# Patient Record
Sex: Female | Born: 1940
Health system: Southern US, Community
[De-identification: ages and names within clinical notes are randomized; demographics above are authoritative.]

## PROBLEM LIST (undated history)

## (undated) ENCOUNTER — Emergency Department (HOSPITAL_BASED_OUTPATIENT_CLINIC_OR_DEPARTMENT_OTHER): Admission: EM | Payer: PPO

## (undated) DIAGNOSIS — D151 Benign neoplasm of heart: Secondary | ICD-10-CM

## (undated) DIAGNOSIS — I10 Essential (primary) hypertension: Secondary | ICD-10-CM

## (undated) DIAGNOSIS — I480 Paroxysmal atrial fibrillation: Secondary | ICD-10-CM

## (undated) DIAGNOSIS — I471 Supraventricular tachycardia, unspecified: Secondary | ICD-10-CM

## (undated) DIAGNOSIS — I4892 Unspecified atrial flutter: Secondary | ICD-10-CM

## (undated) DIAGNOSIS — I351 Nonrheumatic aortic (valve) insufficiency: Secondary | ICD-10-CM

## (undated) DIAGNOSIS — E785 Hyperlipidemia, unspecified: Secondary | ICD-10-CM

## (undated) DIAGNOSIS — I441 Atrioventricular block, second degree: Secondary | ICD-10-CM

## (undated) DIAGNOSIS — G4719 Other hypersomnia: Secondary | ICD-10-CM

## (undated) DIAGNOSIS — M1990 Unspecified osteoarthritis, unspecified site: Secondary | ICD-10-CM

## (undated) DIAGNOSIS — I340 Nonrheumatic mitral (valve) insufficiency: Secondary | ICD-10-CM

## (undated) DIAGNOSIS — J849 Interstitial pulmonary disease, unspecified: Secondary | ICD-10-CM

## (undated) DIAGNOSIS — Z9289 Personal history of other medical treatment: Secondary | ICD-10-CM

## (undated) DIAGNOSIS — Z972 Presence of dental prosthetic device (complete) (partial): Secondary | ICD-10-CM

## (undated) HISTORY — DX: Unspecified atrial flutter: I48.92

## (undated) HISTORY — DX: Personal history of other medical treatment: Z92.89

## (undated) HISTORY — DX: Atrioventricular block, second degree: I44.1

## (undated) HISTORY — DX: Hyperlipidemia, unspecified: E78.5

## (undated) HISTORY — DX: Nonrheumatic mitral (valve) insufficiency: I34.0

## (undated) HISTORY — DX: Paroxysmal atrial fibrillation: I48.0

## (undated) HISTORY — DX: Benign neoplasm of heart: D15.1

## (undated) HISTORY — PX: PARTIAL HYSTERECTOMY: SHX80

## (undated) HISTORY — PX: WRIST SURGERY: SHX841

## (undated) HISTORY — DX: Supraventricular tachycardia: I47.1

## (undated) HISTORY — DX: Essential (primary) hypertension: I10

## (undated) HISTORY — PX: ANKLE SURGERY: SHX546

## (undated) HISTORY — PX: EXCISION OF ATRIAL MYXOMA: SHX5821

## (undated) HISTORY — DX: Supraventricular tachycardia, unspecified: I47.10

---

## 2005-11-14 ENCOUNTER — Ambulatory Visit: Payer: Self-pay | Admitting: Orthopedic Surgery

## 2008-03-08 ENCOUNTER — Ambulatory Visit: Payer: Self-pay | Admitting: Internal Medicine

## 2008-11-21 ENCOUNTER — Ambulatory Visit: Payer: Self-pay | Admitting: Family Medicine

## 2008-11-21 DIAGNOSIS — M1990 Unspecified osteoarthritis, unspecified site: Secondary | ICD-10-CM | POA: Insufficient documentation

## 2008-11-21 DIAGNOSIS — E785 Hyperlipidemia, unspecified: Secondary | ICD-10-CM

## 2008-11-21 DIAGNOSIS — K219 Gastro-esophageal reflux disease without esophagitis: Secondary | ICD-10-CM

## 2008-11-21 DIAGNOSIS — I10 Essential (primary) hypertension: Secondary | ICD-10-CM | POA: Insufficient documentation

## 2008-11-21 DIAGNOSIS — I471 Supraventricular tachycardia: Secondary | ICD-10-CM | POA: Insufficient documentation

## 2008-11-21 DIAGNOSIS — E782 Mixed hyperlipidemia: Secondary | ICD-10-CM | POA: Insufficient documentation

## 2008-11-21 DIAGNOSIS — J309 Allergic rhinitis, unspecified: Secondary | ICD-10-CM | POA: Insufficient documentation

## 2008-11-22 ENCOUNTER — Encounter: Payer: Self-pay | Admitting: Family Medicine

## 2008-12-31 ENCOUNTER — Ambulatory Visit: Payer: Self-pay | Admitting: Family Medicine

## 2008-12-31 DIAGNOSIS — E559 Vitamin D deficiency, unspecified: Secondary | ICD-10-CM | POA: Insufficient documentation

## 2008-12-31 DIAGNOSIS — I080 Rheumatic disorders of both mitral and aortic valves: Secondary | ICD-10-CM | POA: Insufficient documentation

## 2009-01-21 ENCOUNTER — Ambulatory Visit: Payer: Self-pay | Admitting: Family Medicine

## 2009-01-21 LAB — CONVERTED CEMR LAB
ALT: 16 units/L (ref 0–35)
AST: 20 units/L (ref 0–37)
Cholesterol: 252 mg/dL — ABNORMAL HIGH (ref 0–200)
Direct LDL: 179.9 mg/dL
GFR calc non Af Amer: 65.74 mL/min (ref >60)
Glucose, Bld: 96 mg/dL (ref 70–99)
HDL: 39.6 mg/dL (ref >39.00)
LDL Cholesterol: 179 mg/dL
LDL Cholesterol: 179 mg/dL
Total Bilirubin: 0.7 mg/dL (ref 0.3–1.2)
Total CHOL/HDL Ratio: 6
Total Protein: 7.5 g/dL (ref 6.0–8.3)
Triglycerides: 216 mg/dL — ABNORMAL HIGH (ref 0.0–149.0)

## 2009-01-22 LAB — CONVERTED CEMR LAB: Vit D, 25-Hydroxy: 39 ng/mL (ref 30–89)

## 2009-01-25 ENCOUNTER — Ambulatory Visit: Payer: Self-pay | Admitting: Family Medicine

## 2009-02-13 ENCOUNTER — Telehealth: Payer: Self-pay | Admitting: Family Medicine

## 2009-03-01 ENCOUNTER — Ambulatory Visit: Payer: Self-pay | Admitting: Family Medicine

## 2009-03-04 LAB — CONVERTED CEMR LAB
BUN: 11 mg/dL (ref 6–23)
CO2: 30 meq/L (ref 19–32)
GFR calc non Af Amer: 52.13 mL/min (ref >60)
Glucose, Bld: 102 mg/dL — ABNORMAL HIGH (ref 70–99)

## 2009-04-29 ENCOUNTER — Ambulatory Visit: Payer: Self-pay | Admitting: Family Medicine

## 2009-05-06 LAB — CONVERTED CEMR LAB
TSH: 1.4 microintl units/mL (ref 0.35–5.50)
VLDL: 39.4 mg/dL (ref 0.0–40.0)
Vitamin B-12: 808 pg/mL (ref 211–911)

## 2009-08-02 ENCOUNTER — Ambulatory Visit: Payer: Self-pay | Admitting: Family Medicine

## 2009-09-13 ENCOUNTER — Ambulatory Visit: Payer: Self-pay | Admitting: Family Medicine

## 2009-09-13 ENCOUNTER — Encounter: Admission: RE | Admit: 2009-09-13 | Discharge: 2009-09-13 | Payer: Self-pay | Admitting: Family Medicine

## 2009-09-13 DIAGNOSIS — R071 Chest pain on breathing: Secondary | ICD-10-CM | POA: Insufficient documentation

## 2009-09-13 LAB — CONVERTED CEMR LAB
Bilirubin Urine: NEGATIVE
Glucose, Urine, Semiquant: NEGATIVE
Nitrite: POSITIVE
Specific Gravity, Urine: 1.015
Urobilinogen, UA: 0.2
pH: 7.5

## 2009-10-09 ENCOUNTER — Telehealth: Payer: Self-pay | Admitting: Family Medicine

## 2009-10-09 DIAGNOSIS — H9319 Tinnitus, unspecified ear: Secondary | ICD-10-CM | POA: Insufficient documentation

## 2009-10-28 ENCOUNTER — Ambulatory Visit: Payer: Self-pay | Admitting: Otolaryngology

## 2009-10-29 ENCOUNTER — Ambulatory Visit: Payer: Self-pay | Admitting: Family Medicine

## 2009-11-04 ENCOUNTER — Encounter: Payer: Self-pay | Admitting: Family Medicine

## 2009-11-04 LAB — CONVERTED CEMR LAB
AST: 19 units/L (ref 0–37)
Alkaline Phosphatase: 83 units/L (ref 39–117)
Bilirubin, Direct: 0.1 mg/dL (ref 0.0–0.3)
Calcium: 9.6 mg/dL (ref 8.4–10.5)
Chloride: 103 meq/L (ref 96–112)
Direct LDL: 205 mg/dL
Potassium: 4 meq/L (ref 3.5–5.1)
Sodium: 145 meq/L (ref 135–145)

## 2009-11-06 ENCOUNTER — Other Ambulatory Visit: Admission: RE | Admit: 2009-11-06 | Discharge: 2009-11-06 | Payer: Self-pay | Admitting: Family Medicine

## 2009-11-06 ENCOUNTER — Ambulatory Visit: Payer: Self-pay | Admitting: Family Medicine

## 2009-11-06 DIAGNOSIS — R3915 Urgency of urination: Secondary | ICD-10-CM | POA: Insufficient documentation

## 2009-11-06 DIAGNOSIS — R109 Unspecified abdominal pain: Secondary | ICD-10-CM | POA: Insufficient documentation

## 2009-11-11 ENCOUNTER — Telehealth: Payer: Self-pay | Admitting: Family Medicine

## 2009-11-12 LAB — CONVERTED CEMR LAB: Pap Smear: NEGATIVE

## 2009-11-13 ENCOUNTER — Telehealth: Payer: Self-pay | Admitting: Family Medicine

## 2009-11-19 ENCOUNTER — Ambulatory Visit: Payer: Self-pay | Admitting: Family Medicine

## 2009-11-19 DIAGNOSIS — R3129 Other microscopic hematuria: Secondary | ICD-10-CM | POA: Insufficient documentation

## 2009-11-19 LAB — CONVERTED CEMR LAB
Glucose, Urine, Semiquant: NEGATIVE
Protein, U semiquant: NEGATIVE

## 2009-11-20 ENCOUNTER — Encounter: Payer: Self-pay | Admitting: Family Medicine

## 2009-11-21 ENCOUNTER — Encounter: Payer: Self-pay | Admitting: Family Medicine

## 2009-11-27 ENCOUNTER — Encounter: Payer: Self-pay | Admitting: Family Medicine

## 2009-12-27 ENCOUNTER — Ambulatory Visit: Payer: Self-pay | Admitting: Family Medicine

## 2009-12-27 LAB — CONVERTED CEMR LAB
Glucose, Urine, Semiquant: NEGATIVE
Ketones, urine, test strip: NEGATIVE
Nitrite: POSITIVE
Specific Gravity, Urine: 1.025
pH: 5

## 2009-12-28 ENCOUNTER — Encounter: Payer: Self-pay | Admitting: Family Medicine

## 2010-01-22 ENCOUNTER — Ambulatory Visit: Payer: Self-pay | Admitting: Family Medicine

## 2010-01-22 LAB — CONVERTED CEMR LAB
Blood in Urine, dipstick: NEGATIVE
Ketones, urine, test strip: NEGATIVE
Specific Gravity, Urine: 1.02
pH: 5

## 2010-01-24 ENCOUNTER — Encounter: Payer: Self-pay | Admitting: Family Medicine

## 2010-02-18 ENCOUNTER — Ambulatory Visit: Payer: Self-pay | Admitting: Urology

## 2010-04-29 ENCOUNTER — Ambulatory Visit: Payer: Self-pay | Admitting: Family Medicine

## 2010-04-29 ENCOUNTER — Encounter: Payer: Self-pay | Admitting: Family Medicine

## 2010-06-02 ENCOUNTER — Telehealth: Payer: Self-pay | Admitting: Family Medicine

## 2010-06-17 ENCOUNTER — Ambulatory Visit: Payer: Self-pay | Admitting: Family Medicine

## 2010-06-18 LAB — CONVERTED CEMR LAB
ALT: 13 units/L (ref 0–35)
Albumin: 3.6 g/dL (ref 3.5–5.2)
Alkaline Phosphatase: 89 units/L (ref 39–117)
Bilirubin, Direct: 0.1 mg/dL (ref 0.0–0.3)
CO2: 31 meq/L (ref 19–32)
Creatinine, Ser: 1 mg/dL (ref 0.4–1.2)
Potassium: 4.7 meq/L (ref 3.5–5.1)
Sodium: 142 meq/L (ref 135–145)
Total Bilirubin: 0.5 mg/dL (ref 0.3–1.2)
Total Protein: 6.6 g/dL (ref 6.0–8.3)

## 2010-07-01 ENCOUNTER — Ambulatory Visit: Admit: 2010-07-01 | Payer: Self-pay | Admitting: Family Medicine

## 2010-07-22 NOTE — Miscellaneous (Signed)
  Clinical Lists Changes  Medications: Removed medication of CIPROFLOXACIN HCL 500 MG TABS (CIPROFLOXACIN HCL) 1 tab by mouth two times a day x 10 days Added new medication of MACROBID 100 MG CAPS (NITROFURANTOIN MONOHYD MACRO) take 1 tablet by mouth two times a day for 14 days - Signed Rx of MACROBID 100 MG CAPS (NITROFURANTOIN MONOHYD MACRO) take 1 tablet by mouth two times a day for 14 days;  #28 x 0;  Signed;  Entered by: Benny Lennert CMA (AAMA);  Authorized by: Hannah Beat MD;  Method used: Electronically to [facility] [address], [place], [place], [place]  [address], Ph: [phone], Fax: [phone]    Prescriptions: MACROBID 100 MG CAPS (NITROFURANTOIN MONOHYD MACRO) take 1 tablet by mouth two times a day for 14 days  #28 x 0   Entered by:   Benny Lennert CMA (AAMA)   Authorized by:   Hannah Beat MD   Signed by:   Benny Lennert CMA (AAMA) on 11/21/2009   Method used:   Electronically to        [facility] [address] (retail)       [address], [address]       [place], [place]  [address]       Ph: [phone]       Fax: [phone]   RxID:   [id number]   Prior Medications: AMLODIPINE BESYLATE 5 MG TABS (AMLODIPINE BESYLATE) Take 1 tablet by mouth once a day ATENOLOL 25 MG TABS (ATENOLOL) 1 tab by mouth daily prn palpitations. Current Allergies: No known allergies

## 2010-07-22 NOTE — Assessment & Plan Note (Signed)
Summary: ?BACTERIAL INFECTION   Vital Signs:  Patient profile:   70 year old female Height:      65 inches Weight:      187 pounds BMI:     31.23 Temp:     98.4 degrees F oral Pulse rate:   100 / minute Pulse rhythm:   regular BP sitting:   120 / 64  (left arm) Cuff size:   regular  Vitals Entered By: Linde Gillis CMA [name]) (December 27, 2009 3:45 PM) CC: ? UTI   History of Present Illness: Chief complaint still having uti symptoms  Seen 5/18 for urinary urgency and pelvic pain x month ...u culture negative. Feels like symptoms never resolved after Dx with UTi in 09/13/2009.  Given continued symptoms Dr. Salena Saner called in Septra over the phone (no new exam). 6/1- diagonsed with highly resistant UTI- finished 7 day course of Bactrim.  Pt has had no improvement. Has started taking AZO.  No fever. No nausea, vomiting or back pain.   Current Medications (verified): 1)  Amlodipine Besylate 5 Mg Tabs (Amlodipine Besylate) .... Take 1 Tablet By Mouth Once A Day 2)  Atenolol 25 Mg Tabs (Atenolol) .Marland Kitchen.. 1 Tab By Mouth Daily Prn Palpitations. 3)  Macrobid 100 Mg Caps (Nitrofurantoin Monohyd Macro) .Marland Kitchen.. 1 Tab By Mouth Two Times A Day X 14 Days  Allergies (verified): No Known Drug Allergies  Past History:  Past Medical History: Last updated: 11/21/2008 GERD Osteoarthritis Allergic rhinitis Hyperlipidemia Hypertension  Past Surgical History: Last updated: 11/21/2008 Hysterectomy, partial, ? ovary remains... for endometriosis   Family History: Last updated: 11/21/2008 father: throat cancer, MI age 48 mother: CVA age 2, ? HTN brother: lung cancer sister: lung cancer (7siblings)  Social History: Last updated: 11/21/2008 Occupation:retired engineering associate at [employer] Married Never Smoked Alcohol use-no Drug use-no Regular exercise-yes, walks daily, line dancing Diet: skips breakfast, fruits and veggies, water, decaf tea, no caffeine  Risk Factors: Exercise: yes  (11/21/2008)  Risk Factors: Smoking Status: never (11/21/2008)  Review of Systems      See HPI General:  Denies chills and fever. GI:  Denies nausea and vomiting. GU:  Complains of dysuria and urinary frequency; denies hematuria.  Physical Exam  General:  Well-developed,well-nourished,in no acute distress; alert,appropriate and cooperative throughout examination Abdomen:  soft, NT, NO CVA or suprapubic tenderness Psych:  Cognition and judgment appear intact. Alert and cooperative with normal attention span and concentration. No apparent delusions, illusions, hallucinations   Impression & Recommendations:  Problem # 1:  UTI (ICD-599.0) Assessment Deteriorated  Urine culture showed highly resistant e.col in June. Will give 14 day course of Macrobid as it was sensitive to it. Reculture. The following medications were removed from the medication list:    Macrobid 100 Mg Caps (Nitrofurantoin monohyd macro) .Marland Kitchen... Take 1 tablet by mouth two times a day for 14 days Her updated medication list for this problem includes:    Macrobid 100 Mg Caps (Nitrofurantoin monohyd macro) .Marland Kitchen... 1 tab by mouth two times a day x 14 days  Orders: Prescription Created Electronically [phone])  Complete Medication List: 1)  Amlodipine Besylate 5 Mg Tabs (Amlodipine besylate) .... Take 1 tablet by mouth once a day 2)  Atenolol 25 Mg Tabs (Atenolol) .Marland Kitchen.. 1 tab by mouth daily prn palpitations. 3)  Macrobid 100 Mg Caps (Nitrofurantoin monohyd macro) .Marland Kitchen.. 1 tab by mouth two times a day x 14 days  Other Orders: UA Dipstick w/o Micro (manual) (30160) T-Culture, Urine ([id number]) Specimen Handling (  [address]) Prescriptions: MACROBID 100 MG CAPS (NITROFURANTOIN MONOHYD MACRO) 1 tab by mouth two times a day x 14 days  #28 x 0   Entered and Authorized by:   Ruthe Mannan MD   Signed by:   Ruthe Mannan MD on 12/27/2009   Method used:   Electronically to        [facility] [address] (retail)       [address],  [address]       [place], [place]  [address]       Ph: [phone]       Fax: [phone]   RxID:   [id number] MACROBID 100 MG CAPS (NITROFURANTOIN MONOHYD MACRO) 1 tab by mouth two times a day x 7 days  #14 x 0   Entered and Authorized by:   Ruthe Mannan MD   Signed by:   Ruthe Mannan MD on 12/27/2009   Method used:   Electronically to        [facility] [address] (retail)       [address], [address]       [place], [place]  [address]       Ph: [phone]       Fax: [phone]   RxID:   [id number]   Current Allergies (reviewed today): No known allergies   Laboratory Results   Urine Tests  Date/Time Received: December 27, 2009 3:57 PM   Routine Urinalysis   Color: orange Appearance: Cloudy Glucose: negative   (Normal Range: Negative) Bilirubin: negative   (Normal Range: Negative) Ketone: negative   (Normal Range: Negative) Spec. Gravity: 1.025   (Normal Range: 1.003-1.035) Blood: moderate   (Normal Range: Negative) pH: 5.0   (Normal Range: 5.0-8.0) Protein: trace   (Normal Range: Negative) Urobilinogen: 0.2   (Normal Range: 0-1) Nitrite: positive   (Normal Range: Negative) Leukocyte Esterace: large   (Normal Range: Negative)

## 2010-07-22 NOTE — Assessment & Plan Note (Signed)
Summary: 6 m f/u dlo   Vital Signs:  Patient profile:   70 year old female Height:      65 inches Weight:      188.0 pounds BMI:     31.40 Temp:     98.7 degrees F oral Pulse rate:   88 / minute Pulse rhythm:   regular BP sitting:   110 / 68  (left arm) Cuff size:   regular  Vitals Entered By: Benny Lennert CMA [name]) (January 22, 2010 11:15 AM)  History of Present Illness: Chief complaint 6 month followup    Seeing Dr. Artis Flock for URO.. Central low abdominal discomfort.Marland KitchenMarland KitchenMarland KitchenOn UTA for bacterial suppression.  Seen 5/18 for urinary urgency and pelvic pain x month ...u culture negative. Feels like symptoms never resolved after Dx with UTi in 09/13/2009. PAP negative S/P hysterectomy for endometriosis.Marland Kitchenovary remains.  On pelvic exam there was no ttp.  6/1/ given nitrofurantoin for multi drug resistant UTi.  Symptoms resolved.  7/9 E. coli multidrug resistant.Marland Kitchengiven macrobid again...did not work as well as first time.  Saw Uro 1 week ago.Marland KitchenU culture clear, but given UTA. No fever. Has cystoscopy scheduled in 2 weeks. Symptoms some better on UTA. Has been given cream to help with urethral irritation  Given continued symptoms Dr. Salena Saner called in Septra over the phone (no new exam). Pt has had no improvement. Has started taking AZO.  Today she reports  burning all the time vaginally..even with no urination. Constant urination..Well controlled. Continue current medication. flow. Low abdominal pain constant    Hypertension History:      She denies headache, chest pain, palpitations, dyspnea with exertion, peripheral edema, and side effects from treatment.  Well controlled at home.        Positive major cardiovascular risk factors include female age 89 years old or older, hyperlipidemia, and hypertension.  Negative major cardiovascular risk factors include non-tobacco-user status.    Lipid Management History:      Positive NCEP/ATP III risk factors include female age 20 years old  or older and hypertension.  Negative NCEP/ATP III risk factors include non-tobacco-user status.        Comments: She feel mega Red and red yeast rice causing bladder irriation. .    Problems Prior to Update: 1)  Microscopic Hematuria  (ICD-599.72) 2)  Following Surgery Follow-up Vaginal Pap Smear  (ICD-V67.01) 3)  Urinary Urgency  (OAC-166.06) 4)  Pelvic Pain  (ICD-789.09) 5)  Tinnitus  (ICD-388.30) 6)  Uti  (ICD-599.0) 7)  Chest Wall Pain, Anterior  (ICD-786.52) 8)  Allergic Rhinitis  (ICD-477.9) 9)  Tingling  (ICD-782.0) 10)  Unspecified Vitamin D Deficiency  (ICD-268.9) 11)  Mitral Regurgitation  (ICD-396.3) 12)  Hypertension  (ICD-401.9) 13)  Hyperlipidemia  (ICD-272.4) 14)  Allergic Rhinitis  (ICD-477.9) 15)  Osteoarthritis  (ICD-715.90) 16)  Gerd  (ICD-530.81) 17)  Paroxysmal Supraventricular Tachycardia  (ICD-427.0)  Current Medications (verified): 1)  Amlodipine Besylate 5 Mg Tabs (Amlodipine Besylate) .... Take 1 Tablet By Mouth Once A Day 2)  Atenolol 25 Mg Tabs (Atenolol) .Marland Kitchen.. 1 Tab By Mouth Daily Prn Palpitations. 3)  Uta 120 Mg Caps (Meth-Hyo-M Bl-Na Phos-Ph Sal) .... Take One Tablet 4 Times Daily As Needed For Dysuria  Allergies (verified): No Known Drug Allergies  Past History:  Past medical, surgical, family and social histories (including risk factors) reviewed for relevance to current acute and chronic problems.  Past Medical History: Reviewed history from 11/21/2008 and no changes required. GERD Osteoarthritis Allergic rhinitis Hyperlipidemia Hypertension  Past Surgical History: Reviewed history from 11/21/2008 and no changes required. Hysterectomy, partial, ? ovary remains... for endometriosis   Family History: Reviewed history from 11/21/2008 and no changes required. father: throat cancer, MI age 54 mother: CVA age 57, ? HTN brother: lung cancer sister: lung cancer (7siblings)  Social History: Reviewed history from 11/21/2008 and no  changes required. 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 CV:  Denies chest pain or discomfort. Resp:  Denies shortness of breath. GI:  Complains of abdominal pain; denies bloody stools. GU:  Denies abnormal vaginal bleeding.  Physical Exam  General:  Well-developed,well-nourished,in no acute distress; alert,appropriate and cooperative throughout examination Mouth:  MMM Neck:  no carotid bruit or thyromegaly no cervical or supraclavicular lymphadenopathy  Lungs:  Normal respiratory effort, chest expands symmetrically. Lungs are clear to auscultation, no crackles or wheezes. Heart:  Normal rate and regular rhythm. S1 and S2 normal without gallop, murmur, click, rub or other extra sounds. Abdomen:  soft, NT, NO CVA or suprapubic tenderness Pulses:  R and L posterior tibial pulses are full and equal bilaterally  Extremities:  no edema Skin:  Intact without suspicious lesions or rashes   Impression & Recommendations:  Problem # 1:  URINARY URGENCY (ICD-788.63)  Neg UA today. Continue on UTA and keep scheduled follow up with Dr. Artis Flock for cystoscopy.   Orders: UA Dipstick w/o Micro (manual) (098[address])  Problem # 2:  HYPERTENSION (ICD-401.9)  Well controlled. Continue current medication. Refilled atenolol.  Her updated medication list for this problem includes:    Amlodipine Besylate 5 Mg Tabs (Amlodipine besylate) .Marland Kitchen... Take 1 tablet by mouth once a day    Atenolol 25 Mg Tabs (Atenolol) .Marland Kitchen... 1 tab by mouth daily prn palpitations.  Orders: Prescription Created Electronically [phone])  Problem # 3:  HYPERLIPIDEMIA (ICD-272.4) Very poor control. Pt refuses medicaiton. Encouraged exercise, weight loss, healthy eating habits.  Recheck fasting LIPIDS,  AST, ALT  in 3 months Dx 272.0    Labs Reviewed: SGOT: 19 (10/29/2009)   SGPT: 16 (10/29/2009)  Lipid Goals: Chol Goal: 200 (01/22/2010)   HDL Goal: 40 (01/22/2010)   LDL Goal: 130 (01/22/2010)   TG Goal: 150 (01/22/2010)  10 Yr Risk Heart Disease: 8 % Prior 10 Yr Risk Heart Disease: 24 % (08/02/2009)   HDL:59.50 (10/29/2009), 38.50 (04/29/2009)  LDL:179 (01/21/2009), 179 (01/21/2009)  Chol:287 (10/29/2009), 252 (04/29/2009)  Trig:155.0 (10/29/2009), 197.0 (04/29/2009)  Complete Medication List: 1)  Amlodipine Besylate 5 Mg Tabs (Amlodipine besylate) .... Take 1 tablet by mouth once a day 2)  Atenolol 25 Mg Tabs (Atenolol) .Marland Kitchen.. 1 tab by mouth daily prn palpitations. 3)  Uta 120 Mg Caps (Meth-hyo-m bl-na phos-ph sal) .... Take one tablet 4 times daily as needed for dysuria  Hypertension Assessment/Plan:      The patient's hypertensive risk group is category B: At least one risk factor (excluding diabetes) with no target organ damage.  Her calculated 10 year risk of coronary heart disease is 8 %.  Today's blood pressure is 110/68.  Her blood pressure goal is < 140/90.  Lipid Assessment/Plan:      Based on NCEP/ATP III, the patient's risk factor category is "0-1 risk factors".  The patient's lipid goals are as follows: Total cholesterol goal is 200; LDL cholesterol goal is 130; HDL cholesterol goal is  40; Triglyceride goal is 150.  Her LDL cholesterol goal has not been met.    Patient Instructions: 1)  Schedule CPX in 6 months. 2)  BMP prior to visit, ICD-9: 272.0 3)  Hepatic Panel prior to visit ICD-9:  4)  Lipid panel prior to visit ICD-9 :  Prescriptions: ATENOLOL 25 MG TABS (ATENOLOL) 1 tab by mouth daily prn palpitations.  #30 x 11   Entered and Authorized by:   Kerby Nora MD   Signed by:   Kerby Nora MD on 01/22/2010   Method used:   Electronically to        [facility] [address] ([facility]ail)       [address]       [place], [place]   [address]       Ph: [phone]       Fax: [phone]   RxID:   [id number]   Current Allergies (reviewed today): No known allergies   Laboratory Results   Urine Tests  Date/Time Received: January 22, 2010 11:23 AM  Date/Time Reported: January 22, 2010 11:23 AM   Routine Urinalysis   Color: green Appearance: Clear Glucose: negative   (Normal Range: Negative) Bilirubin: negative   (Normal Range: Negative) Ketone: negative   (Normal Range: Negative) Spec. Gravity: 1.020   (Normal Range: 1.003-1.035) Blood: negative   (Normal Range: Negative) pH: 5.0   (Normal Range: 5.0-8.0) Protein: negative   (Normal Range: Negative) Urobilinogen: 0.2   (Normal Range: 0-1) Nitrite: negative   (Normal Range: Negative) Leukocyte Esterace: negative   (Normal Range: Negative)

## 2010-07-22 NOTE — Letter (Signed)
Summary: [facility]  [facility]   Imported By: Maryln Gottron 01/31/2010 14:40:42  _____________________________________________________________________  External Attachment:    Type:   Image     Comment:   External Document

## 2010-07-22 NOTE — Assessment & Plan Note (Signed)
Summary: 6 MTH FU/CLE   Vital Signs:  Patient profile:   70 year old Julie [name]:      65 inches Weight:      187.0 pounds BMI:     31.23 Temp:     97.8 degrees F oral Pulse rate:   76 / minute Pulse rhythm:   regular BP sitting:   140 / 78  (left arm) Cuff size:   large  Vitals Entered By: Benny Lennert CMA Julie [name]) (August 02, 2009 8:51 AM)  History of Present Illness: Chief complaint 6 month follow up  PSVT... once a week occuring .Marland Kitchenuses as needed atenolol.  NAsal discharge worse in last year.Marland Kitchenallergies for years. Post nasal drip ,  recently intermittant cough. No fever, no face pain.  Clartitin, Zyrtec minimal help..Benadryl gave symptoms.   High cholesterol...refuses statin. HAs recently statr krill oil. Has healthy diet.  Hypertension History:      Has not taken medicaiton yet this.... at home usually 120-80-occ 140 sys. Occ dizzyness upon standing..feels it is from BP med..noted since cahnged to losartan Feels tinitus in ears with BP med.        Positive major cardiovascular risk factors include Julie Duncan age 78 years old or older, hyperlipidemia, and hypertension.  Negative major cardiovascular risk factors include non-tobacco-user status.     Problems Prior to Update: 1)  Tingling  (ICD-782.0) 2)  Unspecified Vitamin D Deficiency  (ICD-268.9) 3)  Mitral Regurgitation  (ICD-396.3) 4)  Hypertension  (ICD-401.9) 5)  Hyperlipidemia  (ICD-272.4) 6)  Allergic Rhinitis  (ICD-477.9) 7)  Osteoarthritis  (ICD-715.90) 8)  Gerd  (ICD-530.81) 9)  Paroxysmal Supraventricular Tachycardia  (ICD-427.0)  Current Medications (verified): 1)  Multivitamins   Tabs (Multiple Vitamin) .... Take 1 Tablet By Mouth Once A Day 2)  Calcium Carbonate-Vitamin D 600-400 Mg-Unit  Tabs (Calcium Carbonate-Vitamin D) .... Take 1 Tablet By Mouth Once A Day 3)  Amlodipine Besylate 5 Mg Tabs (Amlodipine Besylate) .... Take 1 Tablet By Mouth Once A Day 4)  Fluticasone Propionate 50 Mcg/act Susp  (Fluticasone Propionate) .... 2 Sprays Per Nostril Daily  Allergies (verified): No Known Drug Allergies  Past History:  Past medical, surgical, family and social histories (including risk factors) reviewed, and no changes noted (except as noted below).  Past Medical History: Reviewed history from 11/21/2008 and no changes required. GERD Osteoarthritis Allergic rhinitis Hyperlipidemia Hypertension  Past Surgical History: Reviewed history from 11/21/2008 and no changes required. Hysterectomy, partial, ? ovary remains... for endometriosis   Family History: Reviewed history from 11/21/2008 and no changes required. father: throat cancer, MI age 57 mother: CVA age 6, ? HTN brother: lung cancer sister: lung cancer (7siblings)  Social History: Reviewed history from 11/21/2008 and no changes required. 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 CV:  Denies chest pain or discomfort, palpitations, and swelling of feet; Heart racing stopped when she sotpped calcium. Resp:  Denies shortness of breath. GI:  Denies abdominal pain, bloody stools, constipation, and diarrhea. GU:  Denies dysuria.  Physical Exam  General:  overweight Julie Duncan inNAD Head:  no maxillary sinus ttp Ears:  clear fluid B TM Nose:  blood in left nare, B turbinated enflamed and pale Mouth:  MMM Neck:  no carotid bruit or thyromegaly no cervical or supraclavicular lymphadenopathy  Lungs:  Normal respiratory effort, chest expands symmetrically.  Lungs are clear to auscultation, no crackles or wheezes. Heart:  Normal rate and regular rhythm. S1 and S2 normal without gallop, murmur, click, rub or other extra sounds. Pulses:  R and L posterior tibial pulses are full and equal bilaterally    Extremities:  no edema    Impression & Recommendations:  Problem # 1:  HYPERTENSION (ICD-401.9) SE to losartan ...change to amlodipine 5 mg daily (accidentally called in lisinopril, cancel) Follow BPs and call with measurements. Encouraged exercise, weight loss, healthy eating habits.  Her updated medication list for this problem includes:    Amlodipine Besylate 5 Mg Tabs (Amlodipine besylate) .Marland Kitchen... Take 1 tablet by mouth once a day  Problem # 2:  HYPERLIPIDEMIA (ICD-272.4) Refuses statin. Start red yeast rice, continue fish oil Work on weight loss and exercsie.   Problem # 3:  ALLERGIC RHINITIS (ICD-477.9) Nasal saline irrigation and nasal steoids..start. Call if not improving in next few weeks for other option like astelin.  no current infeciton.  Her updated medication list for this problem includes:    Fluticasone Propionate 50 Mcg/act Susp (Fluticasone propionate) .Marland Kitchen... 2 sprays per nostril daily  Problem # 4:  PAROXYSMAL SUPRAVENTRICULAR TACHYCARDIA (ICD-427.0) Stable, use BBlocker as needed.   Complete Medication List: 1)  Multivitamins Tabs (Multiple vitamin) .... Take 1 tablet by mouth once a day 2)  Calcium Carbonate-vitamin D 600-400 Mg-unit Tabs (Calcium carbonate-vitamin d) .... Take 1 tablet by mouth once a day 3)  Amlodipine Besylate 5 Mg Tabs (Amlodipine besylate) .... Take 1 tablet by mouth once a day 4)  Fluticasone Propionate 50 Mcg/act Susp (Fluticasone propionate) .... 2 sprays per nostril daily  Other Orders: Prescription Created Electronically [phone])  Hypertension Assessment/Plan:      The patient's hypertensive risk group is category B: At least one risk factor (excluding diabetes) with no target organ damage.  Her calculated 10 year risk of coronary heart disease is 24 %.  Today's blood pressure is 140/78.  Her blood pressure goal is < 140/90.  Patient Instructions: 1)  Follow BP at home..record.call in 1 week with measurments and SE.  2)  Amlodipine 5  mg daily.Marland Kitchenstart. 3)  Nasal saline irrigation twice daily. 4)  Start nasal steroid spray daily.  5)  Continue krill oil. 6)  Red yeast rice..2400 mg daily. 7)  Stop CoQ 10.  8)  Fasting lipids, CMET Dx 272.0 3 months. 9)  Please schedule a follow-up appointment in 6 months .  Prescriptions: FLUTICASONE PROPIONATE 50 MCG/ACT SUSP (FLUTICASONE PROPIONATE) 2 sprays per nostril daily  #1 x 11   Entered and Authorized by:   Kerby Nora MD   Signed by:   Kerby Nora MD on 08/02/2009   Method used:   Electronically to        [facility] [address] ([facility]ail)       [address]       [place], [place]  [address]       Ph: [phone]       Fax: [phone]   RxID:   [id number] AMLODIPINE BESYLATE 5 MG TABS (AMLODIPINE BESYLATE) Take 1 tablet by mouth once a day  #30 x 11   Entered and Authorized by:   Kerby Nora MD   Signed by:   Kerby Nora MD on 08/02/2009   Method used:   Electronically to        [facility] [address] ([facility]ail)  [address]       [place], [place]  [address]       Ph: [phone]       Fax: [phone]   RxID:   [id number] LISINOPRIL 10 MG TABS (LISINOPRIL) 1 tab by mouth daily  #30 x 11   Entered and Authorized by:   Kerby Nora MD   Signed by:   Kerby Nora MD on 08/02/2009   Method used:   Electronically to        [facility] [address]ail)       [address]       [place], [place]  [address]       Ph: [phone]       Fax: [phone]   RxID:   [id number]   Current Allergies (reviewed today): No known allergies

## 2010-07-22 NOTE — Assessment & Plan Note (Signed)
Summary: lLOWER ADB PROBLEM, NO PAIN-DISCOMFORT  CYD   Vital Signs:  Patient profile:   70 year old female Height:      65 inches Weight:      185.6 pounds BMI:     31.00 Temp:     97.8 degrees F oral Pulse rate:   96 / minute Pulse rhythm:   regular BP sitting:   150 / 80  (left arm) Cuff size:   large  Vitals Entered By: Benny Lennert CMA [name]) (Nov 06, 2009 10:21 AM)  History of Present Illness: Chief complaint pelvis pain  70 year old female:  Last time she was here she had a kidney infection. No burning, no urgency. But having a discomfort in her pelvis. Feeling like she is getting ready too urinate. Does not bother her with urinating. No burning.  Startred to take some fiber pills - recently.  had a histerectomy. endometriosus  ROS: no n/v/d, no bloody BM, no melena, no CP, no SOB. No fever. No chills  GEN: WDWN, NAD, Non-toxic, A & O x 3 HEENT: Atraumatic, Normocephalic. Neck supple. No masses, No LAD. Ears and Nose: No external deformity. CV: RRR, No M/G/R. No JVD. No thrill. No extra heart sounds. PULM: CTA B, no wheezes, crackles, rhonchi. No retractions. No resp. distress. No accessory muscle use. ABD: S, mildly tender  R LQ, ND, +BS. No rebound tenderness. No HSM.  GU: normal introitus. Hysterectomy scar observed. NT internally and at pelvic region EXTR: No c/c/e NEURO: Normal gait.  PSYCH: Normally interactive. Conversant. Not depressed or anxious appearing.  Calm demeanor.    Allergies (verified): No Known Drug Allergies  Past History:  Past medical, surgical, family and social histories (including risk factors) reviewed, and no changes noted (except as noted below).  Past Medical History: Reviewed history from 11/21/2008 and no changes required. GERD Osteoarthritis Allergic rhinitis Hyperlipidemia Hypertension  Past Surgical History: Reviewed history from 11/21/2008 and no changes required. Hysterectomy, partial, ? ovary remains... for  endometriosis   Family History: Reviewed history from 11/21/2008 and no changes required. father: throat cancer, MI age 24 mother: CVA age 69, ? HTN brother: lung cancer sister: lung cancer (7siblings)  Social History: Reviewed history from 11/21/2008 and no changes required. Occupation:retired Tree surgeon at [employer] Married Never Smoked Alcohol use-no Drug use-no Regular exercise-yes, walks daily, line dancing Diet: skips breakfast, fruits and veggies, water, decaf tea, no caffeine   Impression & Recommendations:  Problem # 1:  PELVIC  PAIN (ICD-789.09) Exact etiology unclear. Check urine culture.  Abd exam reassuring.  If urine cx negative, would ultrasound to eval adnexa.  Orders: T-Culture, Urine ([id number])  Problem # 2:  URINARY URGENCY (UJW-119.14)  Orders: T-Culture, Urine ([id number])  Problem # 3:  FOLLOWING SURGERY FOLLOW-UP VAGINAL PAP SMEAR (ICD-V67.01)  Orders: Obtaining Screening PAP Smear (Q6578)  Complete Medication List: 1)  Amlodipine Besylate 5 Mg Tabs (Amlodipine besylate) .... Take 1 tablet by mouth once a day 2)  Atenolol 25 Mg Tabs (Atenolol) .Marland Kitchen.. 1 tab by mouth daily prn palpitations.  Current Allergies (reviewed today): No known allergies

## 2010-07-22 NOTE — Consult Note (Signed)
Summary: [facility]   Imported By: Lanelle Bal 01/29/2010 13:27:31  _____________________________________________________________________  External Attachment:    Type:   Image     Comment:   External Document

## 2010-07-22 NOTE — Assessment & Plan Note (Signed)
Summary: 15 MIN ACUTE, ?UTI/NT   Vital Signs:  Patient profile:   70 year old female Height:      65 inches Weight:      187.4 pounds BMI:     31.30 Temp:     97.8 degrees F oral Pulse rate:   120 / minute Pulse rhythm:   regular BP sitting:   130 / 90  (left arm) Cuff size:   large  Vitals Entered By: Benny Lennert CMA [name]) (September 13, 2009 10:05 AM)  History of Present Illness: Chief complaint ? UTI  Chest wall pain, intermitant..has noted since playing golf. Worst with twisting chest. Notives no association with eating, palpitations, exertion. Notices when lies on left side and when wakes uo in AM   Palpitations improved with atenolol daily.   Acute Visit History:      The patient complains of genitourinary symptoms.  These symptoms began 1 week ago.  She notes a history of abdominal pain, frequency, incontinence, and urgency.  She denies abdominal cramping, fever, flank pain, nausea, or vomiting.  Comments: central low abdominal pain. .        Problems Prior to Update: 1)  Allergic Rhinitis  (ICD-477.9) 2)  Tingling  (ICD-782.0) 3)  Unspecified Vitamin D Deficiency  (ICD-268.9) 4)  Mitral Regurgitation  (ICD-396.3) 5)  Hypertension  (ICD-401.9) 6)  Hyperlipidemia  (ICD-272.4) 7)  Allergic Rhinitis  (ICD-477.9) 8)  Osteoarthritis  (ICD-715.90) 9)  Gerd  (ICD-530.81) 10)  Paroxysmal Supraventricular Tachycardia  (ICD-427.0)  Current Medications (verified): 1)  Multivitamins   Tabs (Multiple Vitamin) .... Take 1 Tablet By Mouth Once A Day 2)  Calcium Carbonate-Vitamin D 600-400 Mg-Unit  Tabs (Calcium Carbonate-Vitamin D) .... Take 1 Tablet By Mouth Once A Day 3)  Amlodipine Besylate 5 Mg Tabs (Amlodipine Besylate) .... Take 1 Tablet By Mouth Once A Day 4)  Fluticasone Propionate 50 Mcg/act Susp (Fluticasone Propionate) .... 2 Sprays Per Nostril Daily 5)  Atenolol 25 Mg Tabs (Atenolol) .Marland Kitchen.. 1 Tab By Mouth Daily Prn Palpitations. 6)  Ciprofloxacin Hcl 250 Mg Tabs  (Ciprofloxacin Hcl) .Marland Kitchen.. 1 Tab By Mouth Two Times A Day X 7 Day  Allergies (verified): No Known Drug Allergies  Past History:  Past medical, surgical, family and social histories (including risk factors) reviewed, and no changes noted (except as noted below).  Past Medical History: Reviewed history from 11/21/2008 and no changes required. GERD Osteoarthritis Allergic rhinitis Hyperlipidemia Hypertension  Past Surgical History: Reviewed history from 11/21/2008 and no changes required. Hysterectomy, partial, ? ovary remains... for endometriosis   Family History: Reviewed history from 11/21/2008 and no changes required. father: throat cancer, MI age 63 mother: CVA age 55, ? HTN brother: lung cancer sister: lung cancer (7siblings)  Social History: Reviewed history from 11/21/2008 and no changes required. 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 CV:  Complains of chest pain or discomfort; denies shortness of breath with exertion and swelling of feet. Resp:  Denies shortness of breath, sputum productive, and wheezing. GI:  Denies constipation, diarrhea, and indigestion. Derm:  Denies rash.  Physical Exam  General:  overweight female inNAD Mouth:  MMM Neck:  no carotid bruit or thyromegaly no cervical or supraclavicular lymphadenopathy  Chest Wall:  ttp left antterior chest wall over T11-12 Breasts:  No mass, nodules, thickening, tenderness,  bulging, retraction, inflamation, nipple discharge or skin changes noted.   Lungs:  Normal respiratory effort, chest expands symmetrically. Lungs are clear to auscultation, no crackles or wheezes. Heart:  tachy,  S1 and S2 normal without gallop, murmur, click, rub or other extra sounds.   Impression &  Recommendations:  Problem # 1:  CHEST WALL PAIN, ANTERIOR (ICD-786.52) Most likely MSK..no association with pulm, cards and GI per symptoms. Will check CXR to see if any MSK changes, although no mass palpated.  Orders: Radiology Referral (Radiology)  Problem # 2:  UTI (ICD-599.0) Treat with antibitoc..7 day course given lenght of time with symptoms and age. Push fluids.  Her updated medication list for this problem includes:    Ciprofloxacin Hcl 250 Mg Tabs (Ciprofloxacin hcl) .Marland Kitchen... 1 tab by mouth two times a day x 7 day  Problem # 3:  PAROXYSMAL SUPRAVENTRICULAR TACHYCARDIA (ICD-427.0) TAke additional dose of atenolol today to stop SVT episode.  Her updated medication list for this problem includes:    Atenolol 25 Mg Tabs (Atenolol) .Marland Kitchen... 1 tab by mouth daily prn palpitations.  Complete Medication List: 1)  Multivitamins Tabs (Multiple vitamin) .... Take 1 tablet by mouth once a day 2)  Calcium Carbonate-vitamin D 600-400 Mg-unit Tabs (Calcium carbonate-vitamin d) .... Take 1 tablet by mouth once a day 3)  Amlodipine Besylate 5 Mg Tabs (Amlodipine besylate) .... Take 1 tablet by mouth once a day 4)  Fluticasone Propionate 50 Mcg/act Susp (Fluticasone propionate) .... 2 sprays per nostril daily 5)  Atenolol 25 Mg Tabs (Atenolol) .Marland Kitchen.. 1 tab by mouth daily prn palpitations. 6)  Ciprofloxacin Hcl 250 Mg Tabs (Ciprofloxacin hcl) .Marland Kitchen.. 1 tab by mouth two times a day x 7 day  Patient Instructions: 1)  Referral Appointment Information 2)  Day/Date: 3)  Time: 4)  Place/MD: 5)  Address: 6)  Phone/Fax: 7)  Patient given appointment information. Information/Orders faxed/mailed.  Prescriptions: CIPROFLOXACIN HCL 250 MG TABS (CIPROFLOXACIN HCL) 1 tab by mouth two times a day x 7 day  #14 x 0   Entered and Authorized by:   Kerby Nora MD   Signed by:   Kerby Nora MD on 09/13/2009   Method used:   Electronically to        [facility] [address] (retail)       [address]       [place], [place]  [address]       Ph: [phone]       Fax: [phone]   RxID:   [id number]   Current Allergies (reviewed today): No known allergies   Laboratory Results   Urine Tests  Date/Time Received: September 13, 2009 10:16 AM  Date/Time Reported: September 13, 2009 10:16 AM   Routine Urinalysis   Color: yellow Appearance: Cloudy Glucose: negative   (Normal Range: Negative) Bilirubin: negative   (Normal Range: Negative) Ketone: negative   (Normal Range: Negative) Spec. Gravity: 1.015   (Normal Range: 1.003-1.035) Blood: trace-lysed   (Normal Range: Negative) pH: 7.5   (Normal Range: 5.0-8.0) Protein: 30   (Normal Range: Negative) Urobilinogen: 0.2   (Normal Range: 0-1) Nitrite: positive   (Normal Range: Negative) Leukocyte Esterace: large   (Normal Range: Negative)

## 2010-07-22 NOTE — Consult Note (Signed)
Summary: [facility]   Imported By: Lanelle Bal 11/14/2009 08:33:42  _____________________________________________________________________  External Attachment:    Type:   Image     Comment:   External Document

## 2010-07-22 NOTE — Progress Notes (Signed)
Summary: ringing in ears  Phone Note Call from Patient Call back at [phone]   Caller: Patient Summary of Call: Pt was seen a couple of weeks ago for ringing in her ears. She first noticed this about 2 months ago.  She said this is worse now and she needs something for it.  Asks what she can do for this.  She thinks it's because of the blood pressure medicine she is taking.  Please advise.  Using [facility] [address]. Initial call taken by: Lowella Petties CMA,  October 09, 2009 11:47 AM  Follow-up for Phone Call        I do not think tinnitus would be coming from BP meds.   Extremely common for 35 year olds.  I would typically advise decreased caffeine - - non-urgent, will get Dr. Daphine Deutscher input if she has different recs for this patient. Follow-up by: Hannah Beat MD,  October 09, 2009 11:58 AM  Additional Follow-up for Phone Call Additional follow up Details #1::        I agree..doubt BP medicaitons. Most likely is due to hearing loss. MAy try to decrease caffeine. If interested will refer to ENT for hearing evsal and tinnitus eval.  Additional Follow-up by: Kerby Nora MD,  October 09, 2009 10:56 PM  New Problems: TINNITUS (ICD-388.30)   Additional Follow-up for Phone Call Additional follow up Details #2::    Patient would like referral to Ent Follow-up by: Benny Lennert CMA [name]),  October 10, 2009 7:39 AM  New Problems: TINNITUS (ICD-388.30)

## 2010-07-22 NOTE — Assessment & Plan Note (Signed)
Summary: still having uti symptoms   Vital Signs:  Patient profile:   70 year old female Height:      65 inches Weight:      184.8 pounds BMI:     30.86 Temp:     98.3 degrees F oral Pulse rate:   96 / minute Pulse rhythm:   regular BP sitting:   120 / 70  (left arm) Cuff size:   regular  Vitals Entered By: Benny Lennert CMA [name]) (Nov 19, 2009 10:41 AM)  History of Present Illness: Chief complaint still having uti symptoms  Seen 5/18 for urinary urgency and pelvic pain x month ...u culture negative. Feels like symptoms never resolved after Dx with UTi in 09/13/2009. PAP negative S/P hysterectomy for endometriosis.Marland Kitchenovary remains.  On pelvic exam there was no ttp.   Given continued symptoms Dr. Salena Saner called in Septra over the phone (no new exam). Pt has had no improvement. Has started taking AZO.  Today she reports  burning all the time vaginally..even with no urination. Constant urination..Well controlled. Continue current medication. flow. Low abdominal pain constant  No fever.   Problems Prior to Update: 1)  Following Surgery Follow-up Vaginal Pap Smear  (ICD-V67.01) 2)  Urinary Urgency  (ZOX-096.04) 3)  Pelvic Pain  (ICD-789.09) 4)  Tinnitus  (ICD-388.30) 5)  Uti  (ICD-599.0) 6)  Chest Wall Pain, Anterior  (ICD-786.52) 7)  Allergic Rhinitis  (ICD-477.9) 8)  Tingling  (ICD-782.0) 9)  Unspecified Vitamin D Deficiency  (ICD-268.9) 10)  Mitral Regurgitation  (ICD-396.3) 11)  Hypertension  (ICD-401.9) 12)  Hyperlipidemia  (ICD-272.4) 13)  Allergic Rhinitis  (ICD-477.9) 14)  Osteoarthritis  (ICD-715.90) 15)  Gerd  (ICD-530.81) 16)  Paroxysmal Supraventricular Tachycardia  (ICD-427.0)  Current Medications (verified): 1)  Amlodipine Besylate 5 Mg Tabs (Amlodipine Besylate) .... Take 1 Tablet By Mouth Once A Day 2)  Atenolol 25 Mg Tabs (Atenolol) .Marland Kitchen.. 1 Tab By Mouth Daily Prn Palpitations. 3)  Ciprofloxacin Hcl 500 Mg Tabs (Ciprofloxacin Hcl) .Marland Kitchen.. 1 Tab By Mouth Two  Times A Day X 10 Days  Allergies (verified): No Known Drug Allergies  Past History:  Past medical, surgical, family and social histories (including risk factors) reviewed, and no changes noted (except as noted below).  Past Medical History: Reviewed history from 11/21/2008 and no changes required. GERD Osteoarthritis Allergic rhinitis Hyperlipidemia Hypertension  Past Surgical History: Reviewed history from 11/21/2008 and no changes required. Hysterectomy, partial, ? ovary remains... for endometriosis   Family History: Reviewed history from 11/21/2008 and no changes required. father: throat cancer, MI age 2 mother: CVA age 57, ? HTN brother: lung cancer sister: lung cancer (7siblings)  Social History: Reviewed history from 11/21/2008 and no changes required. 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 CV:  Denies chest pain or discomfort. Resp:  Denies shortness of breath. GI:  Denies bloody stools, constipation, and diarrhea. GU:  Denies hematuria.  Physical Exam  General:  Well-developed,well-nourished,in no acute distress; alert,appropriate and cooperative throughout examination Mouth:  MMM Neck:  no carotid bruit or thyromegaly no cervical or supraclavicular lymphadenopathy  Lungs:  Normal respiratory effort, chest expands symmetrically. Lungs are clear to auscultation, no crackles or wheezes. Heart:  Normal rate and regular rhythm. S1 and S2 normal without gallop, murmur, click, rub or other extra sounds. Abdomen:  Bowel sounds  positive,abdomen soft and ttp B adenxa, minimal central ttpr without masses, organomegaly or hernias noted. Pulses:  R and L posterior tibial pulses are full and equal bilaterally  Extremities:  no  edema   Impression & Recommendations:  Problem # 1:  URINARY URGENCY (QVZ-563.87) Symptomts now nmore consistent with UTI...but concerning since no improvement with cipro 250 or septra . Vaginal irritaion..possible low estrogen/strophic vaginitis, but hard to explain blood in urine with this.  Refer to urologist for further eval.  Orders: UA Dipstick W/ Micro (manual) ([id number]) T-Culture, Urine ([id number]) Urology Referral (Urology)  Problem # 2:  MICROSCOPIC HEMATURIA (ICD-599.72) if U culture repeate comes back posi..likely cause...if neg....concerning for bladder cancer.  Past Cr nml range, no casts.  The following medications were removed from the medication list:    Septra Ds 800-160 Mg Tabs (Sulfamethoxazole-trimethoprim) .Marland Kitchen... 1 by mouth two times a day Her updated medication list for this problem includes:    Ciprofloxacin Hcl 500 Mg Tabs (Ciprofloxacin hcl) .Marland Kitchen... 1 tab by mouth two times a day x 10 days  Orders: T-Culture, Urine ([id number]) Urology Referral (Urology)  Problem # 3:  PELVIC  PAIN (ICD-789.09) Neg PAP...hold on [place] untill URO work up. As with blood in urine ..bladder is most likely primary cul[prit.  Orders: UA Dipstick W/ Micro (manual) ([id number]) T-Culture, Urine ([id number]) Urology Referral (Urology)  Complete Medication List: 1)  Amlodipine Besylate 5 Mg Tabs (Amlodipine besylate) .... Take 1 tablet by mouth once a day 2)  Atenolol 25 Mg Tabs (Atenolol) .Marland Kitchen.. 1 tab by mouth daily prn palpitations. 3)  Ciprofloxacin Hcl 500 Mg Tabs (Ciprofloxacin hcl) .Marland Kitchen.. 1 tab by mouth two times a day x 10 days  Patient Instructions: 1)  Start antibiotic cipro twice daily. 2)  Referral Appointment Information 3)  Day/Date: 4)  Time: 5)  Place/MD: 6)  Address: 7)  Phone/Fax: 8)  Patient given appointment information. Information/Orders faxed/mailed.  Prescriptions: CIPROFLOXACIN HCL 500 MG TABS (CIPROFLOXACIN HCL) 1 tab by mouth two times a day x 10 days  #20  x 0   Entered and Authorized by:   Kerby Nora MD   Signed by:   Kerby Nora MD on 11/19/2009   Method used:   Electronically to        [facility] (retail)       [address], [address]       [place], [place]  [address]       Ph: [phone]       Fax: [phone]   RxID:   [id number]   Current Allergies (reviewed today): No known allergies   Laboratory Results   Urine Tests  Date/Time Received: Nov 19, 2009 10:47 AM  Date/Time Reported: Nov 19, 2009 10:47 AM   Routine Urinalysis   Color: yellow Appearance: Cloudy Glucose: negative   (Normal Range: Negative) Bilirubin: negative   (Normal Range: Negative) Ketone: negative   (Normal Range: Negative) Spec. Gravity: 1.020   (Normal Range: 1.003-1.035) Blood: moderate   (Normal Range: Negative) pH: 5.0   (Normal Range: 5.0-8.0) Protein: negative   (Normal Range: Negative) Urobilinogen: 0.2   (Normal Range: 0-1) Nitrite: positive   (Normal Range: Negative) Leukocyte Esterace: large   (Normal Range: Negative)  Urine Microscopic RBC/HPF: tntc Epithelial/HPF: none       Appended Document: still having uti symptoms

## 2010-07-22 NOTE — Assessment & Plan Note (Signed)
Summary: pain under breast/alc   Vital Signs:  Patient profile:   70 year old female Height:      65 inches Weight:      184.0 pounds BMI:     30.73 Temp:     97.8 degrees F oral Pulse rate:   88 / minute Pulse rhythm:   regular BP sitting:   110 / 84  (left arm) Cuff size:   regular  Vitals Entered By: Benny Lennert CMA [name]) (April 29, 2010 10:53 AM)  History of Present Illness: Chief complaint pain under left breast  HTN, poor control  BP 157/79 On amlodipine..daily. Using atenolol as needed for palpitations.  Peripheral swelling intermittantly, bothering her a lot.  Chronic chest wall pain, intermitant..has noted since playing golf. Worst with twisting chest. No association with eating, palpitations, exertion. Notices when lies on left side and when wakes uo in AM. No exertional pain.  No breast pain. No relationship to eating. Taking OTC famotidinefor reflux dailu  Last mamogram nml 07/2008. CXR was nml.   Palpitations improved with atenololprn.  Tnnitus..nml eval with ENT. Nml audiogram.   Problems Prior to Update: 1)  Microscopic Hematuria  (ICD-599.72) 2)  Following Surgery Follow-up Vaginal Pap Smear  (ICD-V67.01) 3)  Urinary Urgency  (ZOX-096.04) 4)  Pelvic Pain  (ICD-789.09) 5)  Tinnitus  (ICD-388.30) 6)  Uti  (ICD-599.0) 7)  Chest Wall Pain, Anterior  (ICD-786.52) 8)  Allergic Rhinitis  (ICD-477.9) 9)  Tingling  (ICD-782.0) 10)  Unspecified Vitamin D Deficiency  (ICD-268.9) 11)  Mitral Regurgitation  (ICD-396.3) 12)  Hypertension  (ICD-401.9) 13)  Hyperlipidemia  (ICD-272.4) 14)  Allergic Rhinitis  (ICD-477.9) 15)  Osteoarthritis  (ICD-715.90) 16)  Gerd  (ICD-530.81) 17)  Paroxysmal Supraventricular Tachycardia  (ICD-427.0)  Allergies (verified): No Known Drug Allergies  Past History:  Past medical, surgical, family and social histories (including risk factors) reviewed, and no changes noted (except as noted below).  Past Medical  History: Reviewed history from 11/21/2008 and no changes required. GERD Osteoarthritis Allergic rhinitis Hyperlipidemia Hypertension  Past Surgical History: Reviewed history from 11/21/2008 and no changes required. Hysterectomy, partial, ? ovary remains... for endometriosis   Family History: Reviewed history from 11/21/2008 and no changes required. father: throat cancer, MI age 6 mother: CVA age 25, ? HTN brother: lung cancer sister: lung cancer (7siblings)  Social History: Reviewed history from 11/21/2008 and no changes required. 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 CV:  Complains of chest pain or discomfort, palpitations, and swelling of feet; denies difficulty breathing at night and fainting. Resp:  Complains of chest discomfort; denies coughing up blood, pleuritic, shortness of breath, sputum productive, and wheezing. GI:  Denies abdominal pain and bloody stools. GU:  Denies dysuria.  Physical Exam  General:  Well-developed,well-nourished,in no acute distress; alert,appropriate and cooperative throughout examination Mouth:  MMM Neck:  no carotid bruit or thyromegaly no cervical or supraclavicular lymphadenopathy  Chest Wall:  ttp left anterior chest wall over T11-12 Breasts:  No mass, nodules, thickening, tenderness, bulging, retraction, inflamation, nipple discharge or skin changes noted.   Lungs:  Normal respiratory effort, chest expands symmetrically. Lungs are clear to auscultation, no crackles or wheezes. Heart:  Normal rate and regular rhythm. S1 and S2 normal without gallop, murmur, click, rub or other extra sounds. Abdomen:  Bowel  sounds positive,abdomen soft and non-tender without masses, organomegaly or hernias noted. Pulses:   R and L posterior tibial pulses are full and equal bilaterally  Extremities:  no edema Skin:  Intact without suspicious lesions or rashes Psych:  Oriented X3 and memory intact for recent and remote.     Impression & Recommendations:  Problem # 1:  CHEST WALL PAIN, ANTERIOR (ICD-786.52) EKG nml. No clear cardiopulmonary or GI source. Most likely MSK source given cahnge with movement. CXR showed no mass. Get MRI done by uro ..per pt chest included...but I believe she is referring to scouting films done prior to abd/pelvic MRI. offered lidocaine patch.Alorah Petraitis not interested in med...just wants to make sure nothing serious ongoing. Wishes to look into with CT scan if no eval done with MRI.  Problem # 2:  HYPERTENSION (ICD-401.9) Inadequate control.Marland Kitchen.SE form amlodipine swelling.  Hold amlodipine and start atenolol daily. Follow Bps at home call with results.  Her updated medication list for this problem includes:    Amlodipine Besylate 5 Mg Tabs (Amlodipine besylate) .Marland Kitchen... Take 1 tablet by mouth once a day    Atenolol 25 Mg Tabs (Atenolol) .Marland Kitchen... 1 tab by mouth daily prn palpitations.  Problem # 3:  TINNITUS (ICD-388.30) idiopathic. Reviewed ENT note. Consider [facility] tinnitus [facility] .  Doubt secondary to amlodipine as pt feels.   Complete Medication List: 1)  Amlodipine Besylate 5 Mg Tabs (Amlodipine besylate) .... Take 1 tablet by mouth once a day 2)  Atenolol 25 Mg Tabs (Atenolol) .Marland Kitchen.. 1 tab by mouth daily prn palpitations.  Patient Instructions: 1)  Hold amlodipine. Change to taking atenolol 25 daily. 2)   Follow BP at home.Marland Kitchen 3)  Call in 1-2  weeks with measurements. 4)  We will try to get MRI results from urologist.   Orders Added: 1)  Est. Patient Level IV [60454]    Current Allergies (reviewed today): No known allergies

## 2010-07-22 NOTE — Progress Notes (Signed)
Summary: ? UTI  Phone Note Call from Patient Call back at Home Phone [phone]   Caller: Patient Call For: Dr. Patsy Lager Summary of Call: Patient said she was in last week for a ? UTI.  The culture was negative and she was supposed to have an [place] sometime early this week.  She said she came down with the symptoms with a vengance yesterday and is now sure she is having a UTI.  She is taking Azo and is asking if you could please send in an ABX to [facility] on [address].  She does not want the same ABX that she was given last time because she thinks it did not really clear out the infection and she has been having some tingling and discomfort ever since but now it has really flared up.  She prefers now to hold off on the [place] because she is convinced this is indeed a UTI.  [facility] [address] Initial call taken by: Delilah Shan CMA [name]),  Nov 11, 2009 9:45 AM  Follow-up for Phone Call        I think that is reasonable to treat given clinical symptoms  office f/u if no improvement   Follow-up by: Hannah Beat MD,  Nov 11, 2009 9:56 AM    New/Updated Medications: SEPTRA DS 800-160 MG TABS (SULFAMETHOXAZOLE-TRIMETHOPRIM) 1 by mouth two times a day Prescriptions: SEPTRA DS 800-160 MG TABS (SULFAMETHOXAZOLE-TRIMETHOPRIM) 1 by mouth two times a day  #20 x 0   Entered by:   Benny Lennert CMA (AAMA)   Authorized by:   Hannah Beat MD   Signed by:   Benny Lennert CMA (AAMA) on 11/11/2009   Method used:   Electronically to        [facility] [address] (retail)       [address], [address]       [place], [place]  [address]       Ph: [phone]       Fax: [phone]   RxID:   [id number] SEPTRA DS 800-160 MG TABS (SULFAMETHOXAZOLE-TRIMETHOPRIM) 1 by mouth two times a day  #20 x 0   Entered and Authorized by:   Hannah Beat MD   Signed by:   Hannah Beat MD on 11/11/2009   Method used:   Telephoned to ...       [id number] [address] (retail)       [address], [address]       [place], [place]  [address]       Ph: [phone]       Fax: [phone]   RxID:   [id number]   Appended Document: ? UTI patient advised of medication and to follow up if no improvement.Marland Kitchen Consuello Masse CMA

## 2010-07-22 NOTE — Progress Notes (Signed)
Summary: not any better  Phone Note Call from Patient   Caller: Patient Call For: Kerby Nora MD Summary of Call: Pt is taking septra for a possible UTI and she says she is not getting any better.  Asked if ok to take AZO with the septra, advised ok.  Advised pt to call back tomorrow if not better, she will need follow up appt, per Dr. Patsy Lager. Initial call taken by: Lowella Petties CMA,  Nov 13, 2009 4:36 PM  Follow-up for Phone Call        Agree. Follow-up by: Kerby Nora MD,  Nov 13, 2009 4:37 PM

## 2010-07-24 NOTE — Progress Notes (Signed)
Summary: needs to change BP meds  Phone Note Call from Patient Call back at Home Phone [phone]   Caller: Patient Summary of Call: Pt states she was told to stop amlodipine and start atenolol.  She did switch over but she says that medicine slows her down so much that she cant function.  She started back taking amlodipine, but she needs to change to something else because that makes her feet swell, and she cant deal with that.  Please advise.   [facility]ses [facility] [address]. Initial call taken by: Lowella Petties CMA, AAMA,  June 02, 2010 8:28 AM  Follow-up for Phone Call        Start chlorthalidone in place of norvasc. Follow BPs at home. Call with measurements in 2 weeks. MAke appt for BMET Dx 401.1 in 2 weeks as well.  Follow-up by: Kerby Nora MD,  June 03, 2010 10:12 AM  Additional Follow-up for Phone Call Additional follow up Details #1::        Patient advised via message on machine.Consuello Masse CMA   Additional Follow-up by: Benny Lennert CMA [name]),  June 03, 2010 1:48 PM    New/Updated Medications: CHLORTHALIDONE 25 MG TABS (CHLORTHALIDONE) 1 tab by mouth daily Prescriptions: CHLORTHALIDONE 25 MG TABS (CHLORTHALIDONE) 1 tab by mouth daily  #30 x 11   Entered and Authorized by:   Kerby Nora MD   Signed by:   Kerby Nora MD on 06/03/2010   Method used:   Electronically to        [facility] [address] (retail)       [address], [address]       [place], [place]  [address]       Ph: [phone]       Fax: [phone]   RxID:   [id number]

## 2010-07-29 ENCOUNTER — Telehealth: Payer: Self-pay | Admitting: Family Medicine

## 2010-08-07 NOTE — Progress Notes (Signed)
Summary: wants transderm scop patches  Phone Note Call from Patient Call back at Home Phone [phone]   Caller: Patient Call For: Kerby Nora MD Summary of Call: Pt is going on a cruise later this month and is asking for motion sickness patches.  She will be gone 7 days, [facility] [address]. Initial call taken by: Lowella Petties CMA, AAMA,  July 29, 2010 9:32 AM  Follow-up for Phone Call        Patient advised.Consuello Masse CMA   Follow-up by: Benny Lennert CMA [name]),  July 29, 2010 9:51 AM    New/Updated Medications: TRANSDERM-SCOP 1.5 MG PT72 (SCOPOLAMINE BASE) Apply 1 patch every 3 days as needed motion sickness.Marland Kitchen apply at least 4 hours before ship gets under way Prescriptions: TRANSDERM-SCOP 1.5 MG PT72 (SCOPOLAMINE BASE) Apply 1 patch every 3 days as needed motion sickness.Marland Kitchen apply at least 4 hours before ship gets under way  #4 x 0   Entered and Authorized by:   Kerby Nora MD   Signed by:   Kerby Nora MD on 07/29/2010   Method used:   Electronically to        [facility] [address] (retail)       [address]       [place], [place]  [address]       Ph: [phone]       Fax: [phone]   RxID:   [id number] TRANSDERM-SCOP 1.5 MG PT72 (SCOPOLAMINE BASE) Apply 1 patch every 3 days as needed motion sickness.Marland Kitchen apply at least 4 hours before ship gets under way  #4 x 0   Entered and Authorized by:   Kerby Nora MD   Signed by:   Kerby Nora MD on 07/29/2010   Method used:   Electronically to        [facility] [address] (retail)       [address], [address]       [place], [place]  [address]       Ph: [phone]       Fax: [phone]   RxID:   [id number]

## 2010-10-13 ENCOUNTER — Ambulatory Visit: Payer: Self-pay | Admitting: Unknown Physician Specialty

## 2010-11-19 ENCOUNTER — Telehealth: Payer: Self-pay | Admitting: Family Medicine

## 2011-05-18 ENCOUNTER — Ambulatory Visit: Payer: Self-pay | Admitting: Gastroenterology

## 2011-05-19 NOTE — Telephone Encounter (Signed)
Chart opened in error

## 2012-03-17 ENCOUNTER — Ambulatory Visit: Payer: Self-pay | Admitting: Internal Medicine

## 2012-07-01 ENCOUNTER — Ambulatory Visit: Payer: Self-pay | Admitting: Internal Medicine

## 2012-07-27 DIAGNOSIS — M19079 Primary osteoarthritis, unspecified ankle and foot: Secondary | ICD-10-CM | POA: Insufficient documentation

## 2012-08-01 ENCOUNTER — Ambulatory Visit: Payer: Self-pay | Admitting: Physician Assistant

## 2012-11-09 DIAGNOSIS — M65019 Abscess of tendon sheath, unspecified shoulder: Secondary | ICD-10-CM | POA: Insufficient documentation

## 2014-01-25 ENCOUNTER — Ambulatory Visit: Payer: Self-pay | Admitting: Physician Assistant

## 2014-09-11 ENCOUNTER — Ambulatory Visit: Payer: Self-pay | Admitting: Urology

## 2014-09-21 DIAGNOSIS — I480 Paroxysmal atrial fibrillation: Secondary | ICD-10-CM

## 2014-09-21 HISTORY — DX: Paroxysmal atrial fibrillation: I48.0

## 2014-10-04 ENCOUNTER — Ambulatory Visit
Admit: 2014-10-04 | Disposition: A | Discharge status: Home / Self Care | Payer: Self-pay | Attending: Internal Medicine | Admitting: Internal Medicine

## 2014-10-25 DIAGNOSIS — I361 Nonrheumatic tricuspid (valve) insufficiency: Secondary | ICD-10-CM | POA: Insufficient documentation

## 2014-11-11 DIAGNOSIS — I440 Atrioventricular block, first degree: Secondary | ICD-10-CM | POA: Insufficient documentation

## 2014-11-11 DIAGNOSIS — I517 Cardiomegaly: Secondary | ICD-10-CM | POA: Insufficient documentation

## 2014-11-11 DIAGNOSIS — Z87898 Personal history of other specified conditions: Secondary | ICD-10-CM | POA: Insufficient documentation

## 2014-11-15 DIAGNOSIS — R001 Bradycardia, unspecified: Secondary | ICD-10-CM | POA: Insufficient documentation

## 2014-11-17 DIAGNOSIS — I498 Other specified cardiac arrhythmias: Secondary | ICD-10-CM | POA: Insufficient documentation

## 2014-12-06 DIAGNOSIS — Z7901 Long term (current) use of anticoagulants: Secondary | ICD-10-CM | POA: Insufficient documentation

## 2014-12-06 DIAGNOSIS — Z86018 Personal history of other benign neoplasm: Secondary | ICD-10-CM | POA: Insufficient documentation

## 2015-01-24 ENCOUNTER — Other Ambulatory Visit: Payer: Self-pay | Admitting: Physician Assistant

## 2015-01-24 DIAGNOSIS — N644 Mastodynia: Secondary | ICD-10-CM

## 2015-01-25 ENCOUNTER — Other Ambulatory Visit: Payer: Self-pay | Admitting: Physician Assistant

## 2015-01-25 DIAGNOSIS — Z1231 Encounter for screening mammogram for malignant neoplasm of breast: Secondary | ICD-10-CM

## 2015-01-31 ENCOUNTER — Ambulatory Visit
Admission: RE | Admit: 2015-01-31 | Discharge: 2015-01-31 | Disposition: A | Discharge status: Home / Self Care | Payer: Medicare Other | Source: Ambulatory Visit | Attending: Physician Assistant | Admitting: Physician Assistant

## 2015-01-31 ENCOUNTER — Other Ambulatory Visit: Payer: Self-pay | Admitting: Physician Assistant

## 2015-01-31 ENCOUNTER — Ambulatory Visit: Payer: Self-pay

## 2015-01-31 DIAGNOSIS — Z1231 Encounter for screening mammogram for malignant neoplasm of breast: Secondary | ICD-10-CM

## 2015-10-18 ENCOUNTER — Ambulatory Visit: Payer: Medicare Other | Admitting: Family Medicine

## 2016-04-29 ENCOUNTER — Encounter: Payer: Self-pay | Admitting: Internal Medicine

## 2016-04-29 ENCOUNTER — Ambulatory Visit (INDEPENDENT_AMBULATORY_CARE_PROVIDER_SITE_OTHER): Payer: Medicare Other

## 2016-04-29 ENCOUNTER — Encounter (INDEPENDENT_AMBULATORY_CARE_PROVIDER_SITE_OTHER): Payer: Self-pay

## 2016-04-29 ENCOUNTER — Ambulatory Visit (INDEPENDENT_AMBULATORY_CARE_PROVIDER_SITE_OTHER): Payer: Medicare Other | Admitting: Internal Medicine

## 2016-04-29 VITALS — BP 154/84 | HR 75 | Ht 65.0 in | Wt 191.1 lb

## 2016-04-29 DIAGNOSIS — R0600 Dyspnea, unspecified: Secondary | ICD-10-CM | POA: Diagnosis not present

## 2016-04-29 DIAGNOSIS — R002 Palpitations: Secondary | ICD-10-CM

## 2016-04-29 DIAGNOSIS — I471 Supraventricular tachycardia: Secondary | ICD-10-CM | POA: Diagnosis not present

## 2016-04-29 MED ORDER — CARVEDILOL 6.25 MG PO TABS: 6.2500 mg | ORAL_TABLET | Freq: Two times a day (BID) | ORAL | 3 refills | Status: DC

## 2016-04-29 NOTE — Progress Notes (Signed)
New Outpatient Visit Date: 04/29/2016  Referring Provider: Self-referred  Chief Complaint: Palpitations and fatigue  HPI:  Julie Duncan is a 75 y.o. year-old female with history of tachycardia, atrial myxoma status post resection at [facility] in 09/2014, hypertension, and osteoarthritis, who presents today for a second opinion regarding evaluation and management of palpitations and fatigue. The patient reports that she was on atenolol for many years to control rapid heart beats and blood pressure. However, after incidental diagnosis and subsequent surgical excision of atrial myxoma in the spring of 2016, the patient has experienced more frequent palpitations with accompanying fatigue. She now reports that it feels as though her heart races anytime she exerts herself. Even minimal activity such as getting up from a chair precipitates palpitations. The palpitations are also related to eating. She is unable to do any prolonged activity such as housework. She will walk slowly for 10 minutes and then need to rest for 20 minutes due to marked fatigue. She occasionally checks her heart rate during an episode of palpitations and reports that it is as high as 140 bpm. She has experienced accompanying headaches but denies chest pain and lightheadedness with the palpitations. She had one brief episode of chest pain rating across the precordium while lying in bed within the last few months. This resolved spontaneously and has not recurred.  The patient reports that she was on atenolol for many years. However, she seemed to be having frequent episodes of tachycardia after her atrial myxoma excision, prompting switches to diltiazem followed by metoprolol. She was unable to continue with diltiazem secondary to worsening headaches. She was initially on metoprolol tartrate 25 mg twice a day without any improvement in her symptoms. Since escalation to 50 mg twice a day, the patient feels a little more fatigued and short of breath  with minimal activity or when lying down. She reports wearing a cardiac monitor approximately 20 years ago. Review of her extensive outside records is notable for history of supraventricular tachycardia as well as paroxysmal atrial fibrillation in the postoperative period after her myxoma resection. She was briefly on warfarin after the cardiac surgery, though this has not been continued. She is currently only on low-dose aspirin. She has not had any significant bleeding.  Julie Duncan reports a funny feeling in her chest when she lies flat, which has prompted her to sleep on 2 pillows. She denies PND and leg edema but notes a 5-6 pound weight gain over the last couple of months. She does not have claudication but has intermittent aching in both groins. This is not related to particular position or activity.  --------------------------------------------------------------------------------------------------  Cardiovascular History & Procedures: Cardiovascular Problems:  Atrial myxoma status post resection  Paroxysmal atrial fibrillation following myxoma resection  Supraventricular tachycardia  Risk Factors:  Age greater than 48 and hypertension  Cath/PCI:  Left heart cath (09/2014, [facility]): I have personally reviewed the images. No significant CAD. Normal LVEF.   CV Surgery:  Left atrial myxoma resection (11/12/14, [facility])  EP Procedures and Devices:  None.  Non-Invasive Evaluation(s):  Transthoracic echocardiogram (09/25/14): Normal LV size and function (EF greater than 55%) with grade 1 diastolic dysfunction. Mild left atrial enlargement with myxoma measuring up to 2.3 cm. Normal RV size and function.  Recent CV Pertinent Labs: See below.  --------------------------------------------------------------------------------------------------  Past Medical History:  Diagnosis Date  . Atrial myxoma   . Hyperlipidemia   . Hypertension   . Paroxysmal atrial fibrillation ([place]) 09/2014  . SVT  (supraventricular tachycardia) ([place])  Past Surgical History:  Procedure Laterality Date  . ANKLE SURGERY Left   . EXCISION OF ATRIAL MYXOMA    . PARTIAL HYSTERECTOMY      Outpatient Encounter Prescriptions as of 04/29/2016  Medication Sig  . aspirin EC 81 MG tablet Take 81 mg by mouth daily.  . metoprolol (LOPRESSOR) 50 MG tablet Take 50 mg by mouth 2 (two) times daily.   No facility-administered encounter medications on file as of 04/29/2016.     Allergies: Red yeast rice  [cholestin] and Saccharin  Social History   Social History  . Marital status: Married    Spouse name: N/A  . Number of children: N/A  . Years of education: N/A   Occupational History  . Not on file.   Social History Main Topics  . Smoking status: Never Smoker  . Smokeless tobacco: Never Used  . Alcohol use No  . Drug use: No  . Sexual activity: Not on file   Other Topics Concern  . Not on file   Social History Narrative  . No narrative on file    Family History  Problem Relation Age of Onset  . Stroke Mother 62  . Heart attack Father 47  . Cancer Father     throat  . Cancer Sister 50    lung  . Cancer Brother     lung  . Heart attack Brother 70    Review of Systems: Patient notes discoloration of the left second toe. Otherwise, a 12-system review of systems was performed and was negative except as noted in the HPI.  --------------------------------------------------------------------------------------------------  Physical Exam: BP (!) 154/84 (BP Location: Right Arm, Cuff Size: Normal)   Pulse 75   Ht 5\' 5"  (1.651 m)   Wt 191 lb 1.9 oz (86.7 kg)   BMI 31.80 kg/m   General:  Overweight woman, seated comfortably in the exam room. HEENT: No conjunctival pallor or scleral icterus.  Moist mucous membranes.  OP clear. Neck: Supple without lymphadenopathy, thyromegaly, JVD, or HJR.  No carotid bruit. Lungs: Normal work of breathing.  Mildly diminished breath sounds at the bases.  No wheezes or crackles Heart: Regular rate and rhythm without murmurs, rubs, or gallops.  Non-displaced PMI. Abd: Bowel sounds present.  Soft, NT/ND without hepatosplenomegaly Ext: Trace pretibial edema bilaterally.  Radial, PT, and DP pulses are 2+ bilaterally Skin: warm and dry without rash Neuro: CNIII-XII intact.  Strength and fine-touch sensation intact in upper and lower extremities bilaterally. Psych: Normal mood and affect.  EKG:  Sinus rhythm with first-degree AV block and isolated PVC versus PAC with aberrant conduction  Outside labs: CBC (11/28/15): White blood cell count 5.0, hemoglobin 13.1, hematocrit 40.6, platelets 365  CMP (11/28/15): Sodium 142, potassium 4.2, chloride 105, CO2 30, BUN 11, creatinine 0.9, glucose 96, calcium 9.2, AST 16, ALT 10, alkaline phosphatase 89, total bilirubin 0.5, total protein 7.2, albumin 3.9  TSH (11/28/15): 1.207  Lipid panel (11/28/15): Total cholesterol 244, triglycerides 152, HDL 53, LDL 161  --------------------------------------------------------------------------------------------------  ASSESSMENT AND PLAN: Palpitations The patient reports frequent palpitations over the last several months, which have increased since her myxoma resection in May, 2016. Her EKG today demonstrates sinus rhythm with first-degree AV block and single supraventricular versus ventricular ectopic beat. Given her significant cardiac history, including myxoma status post resection and both proximal SVT and atrial fibrillation, a host of atrial arrhythmias could be leading to her symptoms. We have agreed to perform a 14-day monitor (Ziopatch) to better characterize her arrhythmia.  In the meantime, we will continue with low-dose aspirin. We will transition from metoprolol to carvedilol (as detailed below), to see if this improves some of her fatigue and dyspnea.  Shortness of breath and fatigue This is likely multifactorial but could be due to paroxysmal atrial  fibrillation and medication side effects. It seems as though her symptoms have gotten worse since switching from atenolol to diltiazem followed by metoprolol. We have discussed returning to atenolol versus trying an alternative beta blocker and have agreed to switch metoprolol to carvedilol 6.25 mg twice a day. We will up titrate this further as symptoms and blood pressure allow. We will also obtain a transthoracic echocardiogram to evaluate for any significant structural heart disease.  Hypertension Blood pressure is mildly elevated today. We will switch from metoprolol to carvedilol, as noted above.  Hyperlipidemia LDL was 161 in June. Patient does not have evidence of atherosclerotic cardiovascular disease, with normal cardiac catheterization in 09/2014.  No pharmacologic intervention at this time.  Follow-up: Return to clinic in 6 weeks.  Greater than 60 minutes was spent interviewing and examining the patient, as well as reviewing her extensive outside records and prior cardiac catheterization films. More than 50% of the time was spent face-to-face with the patient.  Nelva Bush, MD 04/29/2016 10:41 AM

## 2016-04-29 NOTE — Patient Instructions (Signed)
Medication Instructions:  Your physician has recommended you make the following change in your medication:  STOP taking metoprolol START taking coreg 6.25mg  twice daily   Labwork: none  Testing/Procedures: Your physician has requested that you have an echocardiogram. Echocardiography is a painless test that uses sound waves to create images of your heart. It provides your doctor with information about the size and shape of your heart and how well your heart's chambers and valves are working. This procedure takes approximately one hour. There are no restrictions for this procedure.  Your physician has recommended that you wear an event monitor. Event monitors are medical devices that record the heart's electrical activity. Doctors most often [place] these monitors to diagnose arrhythmias. Arrhythmias are problems with the speed or rhythm of the heartbeat. The monitor is a small, portable device. You can wear one while you do your normal daily activities. This is usually used to diagnose what is causing palpitations/syncope (passing out).      Follow-Up: Your physician recommends that you schedule a follow-up appointment in: 6 weeks with Dr. Saunders Revel.    Any Other Special Instructions Will Be Listed Below (If Applicable).     If you need a refill on your cardiac medications before your next appointment, please call your pharmacy.

## 2016-05-03 DIAGNOSIS — R0600 Dyspnea, unspecified: Secondary | ICD-10-CM

## 2016-05-03 DIAGNOSIS — R002 Palpitations: Secondary | ICD-10-CM

## 2016-05-13 ENCOUNTER — Telehealth: Payer: Self-pay | Admitting: Internal Medicine

## 2016-05-13 MED ORDER — CARVEDILOL 6.25 MG PO TABS: 3.1250 mg | ORAL_TABLET | Freq: Two times a day (BID) | ORAL | 3 refills | Status: DC

## 2016-05-13 NOTE — Telephone Encounter (Signed)
I spoke with the patient regarding the results of her ZioPatch monitor, which only provided 2.5 days of usable data (kept falling off per patient).  The monitor showed mostly sinus rhythm with some PVC's and episodes of Wenckebach (corresponding to skipped beats).  Narrow complex tachycardia was also seen, though the patient states she did not exert herself while wearing the monitor.  This raises the possibility of atrial tachycardia or other SVT, particularly given her history of atrial myxoma s/p resection.  We will try decreasing carvedilol to 3.125 mg BID to see if this helps her symptoms, which have been unchanged since switching from metoprolol to carvedilol.  We will proceed with echocardiogram as scheduled on 05/28/16 and arrange for EP consultation shortly after echo has been completed.  The patient will contact us if any questions or concerns arise in the meantime.  Nelva Bush, MD [facility] Pager: [phone]

## 2016-05-28 ENCOUNTER — Other Ambulatory Visit: Payer: Medicare Other

## 2016-06-10 ENCOUNTER — Ambulatory Visit: Payer: Medicare Other | Admitting: Internal Medicine

## 2016-06-11 ENCOUNTER — Other Ambulatory Visit: Payer: Self-pay

## 2016-06-11 ENCOUNTER — Ambulatory Visit (INDEPENDENT_AMBULATORY_CARE_PROVIDER_SITE_OTHER): Payer: Medicare Other

## 2016-06-11 DIAGNOSIS — R0600 Dyspnea, unspecified: Secondary | ICD-10-CM

## 2016-06-11 DIAGNOSIS — R002 Palpitations: Secondary | ICD-10-CM | POA: Diagnosis not present

## 2016-06-12 LAB — ECHOCARDIOGRAM COMPLETE
AOASC: 31 cm
CHL CUP TV REG PEAK VELOCITY: 269 cm/s
FS: 41 % (ref 28–44)
IVS/LV PW RATIO, ED: 0.87
LA ID, A-P, ES: 38 mm
LA diam end sys: 38 mm
LA vol A4C: 44.6 ml
LA vol: 49.1 mL
LADIAMINDEX: 1.96 cm/m2
LAVOLIN: 25.3 mL/m2
LV PW d: 12 mm — AB (ref 0.6–1.1)
LVOT SV: 39 mL
LVOT VTI: 13.8 cm
LVOT area: 2.84 cm2
LVOT peak vel: 73.2 cm/s
LVOTD: 19 mm
RV LATERAL S' VELOCITY: 11.3 cm/s
RV TAPSE: 15.9 mm
TRMAXVEL: 269 cm/s

## 2016-06-25 ENCOUNTER — Ambulatory Visit (INDEPENDENT_AMBULATORY_CARE_PROVIDER_SITE_OTHER): Payer: Medicare Other | Admitting: Internal Medicine

## 2016-06-25 ENCOUNTER — Encounter: Payer: Self-pay | Admitting: Internal Medicine

## 2016-06-25 VITALS — BP 120/62 | HR 86 | Ht 65.0 in | Wt 192.0 lb

## 2016-06-25 DIAGNOSIS — I493 Ventricular premature depolarization: Secondary | ICD-10-CM | POA: Diagnosis not present

## 2016-06-25 DIAGNOSIS — R002 Palpitations: Secondary | ICD-10-CM

## 2016-06-25 DIAGNOSIS — I441 Atrioventricular block, second degree: Secondary | ICD-10-CM | POA: Diagnosis not present

## 2016-06-25 DIAGNOSIS — R000 Tachycardia, unspecified: Secondary | ICD-10-CM

## 2016-06-25 MED ORDER — CARVEDILOL 6.25 MG PO TABS: 6.2500 mg | ORAL_TABLET | Freq: Two times a day (BID) | ORAL | 4 refills | Status: DC

## 2016-06-25 NOTE — Patient Instructions (Addendum)
Medication Instructions: - Your physician recommends that you continue on your current medications as directed. Please refer to the Current Medication list given to you today.  Labwork: - none ordered  Procedures/Testing: - Your physician has recommended that you wear an event monitor. Event monitors are medical devices that record the heart's electrical activity. Doctors most often [place] these monitors to diagnose arrhythmias. Arrhythmias are problems with the speed or rhythm of the heartbeat. The monitor is a small, portable device. You can wear one while you do your normal daily activities. This is usually used to diagnose what is causing palpitations/syncope (passing out).  - Your physician has requested that you have an exercise tolerance test- in 4 weeks (on a day Dr. Caryl Comes is here in the office to monitor while on the treadmill). For further information please visit [email or web address]. Please also follow instruction sheet, as given.  ( the day of your treadmill test- please wear tennis shoes/ non-slip shoes, and comfortable cloths)  Follow-Up: - we will cancel your follow up with Dr. Saunders Revel for now.  - follow up will be dependent on the results of your tests  Any Additional Special Instructions Will Be Listed Below (If Applicable).     If you need a refill on your cardiac medications before your next appointment, please call your pharmacy.

## 2016-06-25 NOTE — Progress Notes (Signed)
ELECTROPHYSIOLOGY CONSULT NOTE  Patient ID: Julie Duncan, MRN: [id number], DOB/AGE: May 31, 1941 76 y.o. Admit date: (Not on file) Date of Consult: 06/25/2016  Primary Physician: Marinda Elk, MD Primary Cardiologist: CE Consulting Physician CE  Chief Complaint: tachypalpitations   HPI Julie Duncan is a 76 y.o. female seen for recurrent tachycardia.  She has a long-standing history of tachycardia dating back about 20 years. These episodes would be abrupt in onset and offset. They interfered with her daily activities hardly at all. She was evaluated at that time by Dr. Laurelyn Sickle. She had multiple tests done. Nothing was identified. She was treated with atenolol with moderate success. 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 Catheterization 4/16 coronary disease and normal left ventricular function  Echocardiogram 4/16 normal LV function myxoma noted.  Following her myxoma surgery, she has had persistent problems with exercise associated tachypalpitations and intolerance. She is limited to 5 or 10 minutes of activity, even cooking is a challenge. Climbing stairs is a problem. Shetachypalpitations and has to sit down for 10-20 minutes to recover. She does not have orthostatic tachypalpitations or lightheadedness. She does not have heat/shower associated lightheadedness or tachypalpitations.  More recently she has also had problems at night prompting her to sleep on pillows. She was awakened in the early morning with hard and fast palpitations which are quite frightening.  No monitoring has yet been done.  She does not use caffeine.  6/17 TSH 1.2 HgB 13.1 Cr  0.9  K  4.2  A recent echocardiogram 12/17 demonstrated normal LV function.  Filling characteristics could not be identified.    Past Medical History:  Diagnosis Date  . Atrial myxoma   . Hyperlipidemia   . Hypertension   . Paroxysmal atrial fibrillation ([place]) 09/2014  .  SVT (supraventricular tachycardia) [facility])       Surgical History:  Past Surgical History:  Procedure Laterality Date  . ANKLE SURGERY Left   . EXCISION OF ATRIAL MYXOMA    . PARTIAL HYSTERECTOMY       Home Meds: Prior to Admission medications   Medication Sig Start Date End Date Taking? Authorizing Provider  aspirin EC 81 MG tablet Take 81 mg by mouth daily.   Yes Historical Provider, MD  carvedilol (COREG) 6.25 MG tablet Take 6.25 mg by mouth 2 (two) times daily with a meal.   Yes Historical Provider, MD    Allergies:  Allergies  Allergen Reactions  . Red Yeast Rice  [Cholestin] Other (See Comments)  . Saccharin Other (See Comments)    Social History   Social History  . Marital status: Married    Spouse name: N/A  . Number of children: N/A  . Years of education: N/A   Occupational History  . Not on file.   Social History Main Topics  . Smoking status: Never Smoker  . Smokeless tobacco: Never Used  . Alcohol use No  . Drug use: No  . Sexual activity: Not on file   Other Topics Concern  . Not on file   Social History Narrative  . No narrative on file     Family History  Problem Relation Age of Onset  . Stroke Mother 40  . Heart attack Father 82  . Cancer Father     throat  . Cancer Sister 97    lung  . Cancer Brother     lung  . Heart attack Brother 21  ROS:  Please see the history of present illness.     All other systems reviewed and negative.    Physical Exam: Blood pressure 120/62, pulse 86, height 5\' 5"  (1.651 m), weight 192 lb (87.1 kg). General: Well developed, well nourished female in no acute distress. Head: Normocephalic, atraumatic, sclera non-icteric, no xanthomas, nares are without discharge. EENT: normal  Lymph Nodes:  none Neck: Negative for carotid bruits. JVD not elevated. Back:without scoliosis kyphosis Lungs: Clear bilaterally to auscultation without wheezes, rales, or rhonchi. Breathing is unlabored. Heart: RRR with S1  S2. No murmur . No rubs, or gallops appreciated. Abdomen: Soft, non-tender, non-distended with normoactive bowel sounds. No hepatomegaly. No rebound/guarding. No obvious abdominal masses. Msk:  Strength and tone appear normal for age. Extremities: No clubbing or cyanosis. No edema.  Distal pedal pulses are 2+ and equal bilaterally. Skin: Warm and Dry Neuro: Alert and oriented X 3. CN III-XII intact Grossly normal sensory and motor function . Psych:  Responds to questions appropriately with a normal affect.      Labs: Cardiac Enzymes No results for input(s): CKTOTAL, CKMB, TROPONINI in the last 72 hours. CBC No results found for: WBC, HGB, HCT, MCV, PLT PROTIME: No results for input(s): LABPROT, INR in the last 72 hours. Chemistry No results for input(s): NA, K, CL, CO2, BUN, CREATININE, CALCIUM, PROT, BILITOT, ALKPHOS, ALT, AST, GLUCOSE in the last 168 hours.  Invalid input(s): LABALBU Lipids Lab Results  Component Value Date   CHOL 257 (H) 06/17/2010   HDL 46.60 06/17/2010   LDLCALC 179 01/21/2009   LDLCALC 179 01/21/2009   LDLCALC 179 01/21/2009   TRIG 185.0 (H) 06/17/2010   BNP No results found for: PROBNP Thyroid Function Tests: No results for input(s): TSH, T4TOTAL, T3FREE, THYROIDAB in the last 72 hours.  Invalid input(s): FREET3 Miscellaneous No results found for: DDIMER  Radiology/Studies:  No results found.  EKG: sinus 86 24/08/38   Assessment and Plan:  Tachypalpitations  Exercise intolerance  Status post myxoma resection   The patient has 3 tachycardia palpitation syndromes. The first antedated her surgery and with its abrupt onset and offset sounds like some type of AV reentry tachycardia. It has been largely quiescent  Almost universal tachypalpitations with exercise intolerance suggests inappropriate sinus mechanism. Blood work as noted 6/17 above normal. This could be autonomic. I will need to do research as to whether this can be disrupted by  myxoma surgery. Treadmill testing hopefully will be illuminating.  The tachypalpitations that awaken her at night are her mind distinct. I am not sure of the mechanism.  We will use a 30 day recorder to try to clarify  With her normal LV function, it is unlikely to be ventricular tachycardia  Echocardiogram was not useful in looking for filling parameters. In the context of postsurgical dyspnea and tachycardia palpitations I wonder whether there is any constrictive physiology. Her neck veins were not elevated today. A literature search failed to identify reports of constrictive physiology after resection surgery, but ...   Is her AV block contributing?          Virl Axe

## 2016-06-26 ENCOUNTER — Ambulatory Visit: Payer: Medicare Other | Admitting: Internal Medicine

## 2016-07-01 ENCOUNTER — Ambulatory Visit (INDEPENDENT_AMBULATORY_CARE_PROVIDER_SITE_OTHER): Payer: Medicare Other

## 2016-07-01 DIAGNOSIS — R000 Tachycardia, unspecified: Secondary | ICD-10-CM | POA: Diagnosis not present

## 2016-07-21 ENCOUNTER — Ambulatory Visit (INDEPENDENT_AMBULATORY_CARE_PROVIDER_SITE_OTHER): Payer: Medicare Other

## 2016-07-21 DIAGNOSIS — R000 Tachycardia, unspecified: Secondary | ICD-10-CM | POA: Diagnosis not present

## 2016-07-24 LAB — EXERCISE TOLERANCE TEST
CHL CUP MPHR: 143 {beats}/min
CSEPED: 5 min
CSEPEW: 5.1 METS
CSEPPHR: 125 {beats}/min
Exercise duration (sec): 10 s
Percent HR: 87 %
Rest HR: 95 {beats}/min

## 2016-08-13 ENCOUNTER — Telehealth: Payer: Self-pay | Admitting: Internal Medicine

## 2016-08-13 NOTE — Telephone Encounter (Signed)
Please have Julie Duncan stop carvedilol for a few days. If her diarrhea resolves, we will try a different beta-blocker (though she has not done well with metoprolol and atenolol in the past). If the diarrhea persists despite holding the medication, she should restart carvedilol 6.25 mg BID and speak with her PCP about evaluation of the diarrhea. Thanks.

## 2016-08-13 NOTE — Telephone Encounter (Signed)
Returned call to patient. She stated that she's been having diarrhea about 8-9 hours after taking her dose of carvedilol. She says she makes sure to take it with meals, even though she doesn't eat enough at one time. She says the diarrhea has gotten worse since November when she first start on the Carvedilol 6.25 mg BID after OV with Dr End on 04/29/17. She doesn't remember the diarrhea when she first starting taking it in November but its gotten worse since then. Carvedilol does not cause any stomach discomfort or any other side effects, just the diarrhea. One week ago, she started taking 1/2 tablet. She said this has not lessened the diarrhea. Taking 1/2 tablet does not help her palpitations as well either. She feels better and less fatigued when taking the full tablet 6.25mg  BID. She says she can't deal with this diarrhea anymore as she was in church last night and couldn't hold it and had to leave because she did not make it to the bathroom in time. Advised patient to continue with carvedilol until I get advice from Dr End.  Will route to Dr End for advice.

## 2016-08-13 NOTE — Telephone Encounter (Signed)
S/w patient. Dr Darnelle Bos recommendations given and patient verbalized understanding.

## 2016-08-13 NOTE — Telephone Encounter (Signed)
Pt states she is supposed to eat a full meal when she takes Coreg. She states is not able to eat a full meal when she takes this, and it is "tearing her bowels up". She would like a different medication. She states even with a 1/2 pill still "tears my stomach up". Please call.

## 2016-08-17 NOTE — Telephone Encounter (Signed)
No answer. Left message to call back.   

## 2016-08-18 NOTE — Telephone Encounter (Signed)
I will defer further adjustment of beta-blockers and management of her palpitations to Dr. Caryl Comes. The patient should schedule an appointment with me at her earliest convenience to reassess her shortness of breath and fatigue.

## 2016-08-18 NOTE — Telephone Encounter (Signed)
Called patient. Appt with Dr End scheduled for 08/26/16. Patient has appt with Dr Caryl Comes 10/01/16.  Will route to Dr Caryl Comes as advised by Dr End.

## 2016-08-18 NOTE — Telephone Encounter (Signed)
Received incoming call from patient. She stated she stopped the carvedilol on 08/13/16 by not taking the evening dose. She stated she did not have diarrhea on Friday or Saturday. She decided to take her Metoprolol 25 mg on Saturday evening and has since been taking Metoprolol 25 mg by mouth twice a day. She stated she still has palpitations or periods of "racing of her heart beat." She says she's so used to it she just deals with it. She prefers to take metoprolol and not the carvedilol. Stated "I cannot deal with the diarrhea." She could not say if it was better on the carvedilol which she started in November.   Patient also asked about her monitor results that Dr Caryl Comes ordered.  I gave her the results of monitor as stated by Dr Caryl Comes. Dr Caryl Comes had recommended follow up with him. Patient transferred to scheduler to schedule follow-up appt.  Will route to Dr End for further advice.

## 2016-08-18 NOTE — Telephone Encounter (Signed)
Left message to call back on patient's home and cell phone numbers.

## 2016-08-26 ENCOUNTER — Other Ambulatory Visit
Admission: RE | Admit: 2016-08-26 | Discharge: 2016-08-26 | Disposition: A | Discharge status: Home / Self Care | Payer: Medicare Other | Source: Ambulatory Visit | Attending: Internal Medicine | Admitting: Internal Medicine

## 2016-08-26 ENCOUNTER — Encounter: Payer: Self-pay | Admitting: Internal Medicine

## 2016-08-26 ENCOUNTER — Ambulatory Visit (INDEPENDENT_AMBULATORY_CARE_PROVIDER_SITE_OTHER): Payer: Medicare Other | Admitting: Internal Medicine

## 2016-08-26 VITALS — BP 144/66 | HR 96 | Ht 65.0 in | Wt 194.5 lb

## 2016-08-26 DIAGNOSIS — I498 Other specified cardiac arrhythmias: Secondary | ICD-10-CM

## 2016-08-26 DIAGNOSIS — R0602 Shortness of breath: Secondary | ICD-10-CM

## 2016-08-26 DIAGNOSIS — Z79899 Other long term (current) drug therapy: Secondary | ICD-10-CM | POA: Diagnosis not present

## 2016-08-26 DIAGNOSIS — I480 Paroxysmal atrial fibrillation: Secondary | ICD-10-CM | POA: Insufficient documentation

## 2016-08-26 DIAGNOSIS — I10 Essential (primary) hypertension: Secondary | ICD-10-CM

## 2016-08-26 LAB — BASIC METABOLIC PANEL
ANION GAP: 6 (ref 5–15)
BUN: 13 mg/dL (ref 6–20)
CALCIUM: 8.9 mg/dL (ref 8.9–10.3)
CHLORIDE: 107 mmol/L (ref 101–111)
CO2: 26 mmol/L (ref 22–32)
Creatinine, Ser: 0.92 mg/dL (ref 0.44–1.00)
GFR calc non Af Amer: 59 mL/min — ABNORMAL LOW (ref >60)
Glucose, Bld: 94 mg/dL (ref 65–99)
POTASSIUM: 4.1 mmol/L (ref 3.5–5.1)
Sodium: 139 mmol/L (ref 135–145)

## 2016-08-26 LAB — CBC WITH DIFFERENTIAL/PLATELET
BASOS ABS: 0 10*3/uL (ref 0–0.1)
BASOS PCT: 1 %
Eosinophils Absolute: 0.2 10*3/uL (ref 0–0.7)
Eosinophils Relative: 3 %
HEMATOCRIT: 40.5 % (ref 35.0–47.0)
HEMOGLOBIN: 13.8 g/dL (ref 12.0–16.0)
LYMPHS PCT: 32 %
Lymphs Abs: 1.9 10*3/uL (ref 1.0–3.6)
MCH: 28.9 pg (ref 26.0–34.0)
MCHC: 34.2 g/dL (ref 32.0–36.0)
MCV: 84.6 fL (ref 80.0–100.0)
Monocytes Absolute: 0.5 10*3/uL (ref 0.2–0.9)
Monocytes Relative: 8 %
NEUTROS ABS: 3.3 10*3/uL (ref 1.4–6.5)
NEUTROS PCT: 56 %
Platelets: 320 10*3/uL (ref 150–440)
RBC: 4.78 MIL/uL (ref 3.80–5.20)
RDW: 14.5 % (ref 11.5–14.5)
WBC: 5.9 10*3/uL (ref 3.6–11.0)

## 2016-08-26 MED ORDER — APIXABAN 5 MG PO TABS: 5.0000 mg | ORAL_TABLET | Freq: Two times a day (BID) | ORAL | 3 refills | Status: DC

## 2016-08-26 NOTE — Progress Notes (Signed)
Follow-up Outpatient Visit Date: 08/26/2016  Primary Care Provider: Marinda Elk, MD [place] [address]  Chief Complaint: Follow-up shortness of breath and palpitations  HPI:  Julie Duncan is a 76 y.o. year-old female with history of supraventricular tachycardia, atrial myxoma status post resection at [facility] in 01/4165 complicated by post-operative a-fib, hypertension, and osteoarthritis, who presents for follow-up of palpitations and shortness of breath. I last saw her on 04/29/16, at which time we agreed to switch metoprolol to carvedilol to see if this helped improved her shortness of breath and palpitations. She noted decreased palpitations and overall felt better, though she eventually developed profuse diarrhea that she attributed to carvedilol. Since discontinuing carvedilol a few weeks ago, her diarrhea has ceased. However, she is now back on metoprolol and feels the palpitations more frequently. They occur most days and are associated with shortness of breath and fatigue. She has not had any lightheadedness or dizziness. She also denies chest pain, orthopnea, and PND. She has chronic dependent edema involving the left ankle, where she has had surgical intervention in the past.  Since our last visit, the patient has been seen by Dr. Caryl Comes in EP due to event monitor demonstrating occasional Wenkebach as well as rare PACs and PVCs. Subsequent 30 day event monitor was recently completed and showed frequent PVCs and Wenkebach. Atrial fibrillation and SVT that may reflect flutter were also identified (a-fib burden 1%).  --------------------------------------------------------------------------------------------------  Cardiovascular History & Procedures: Cardiovascular Problems:  Atrial myxoma status post resection  Paroxysmal atrial fibrillation following myxoma resection  Supraventricular tachycardia  Risk Factors:  Age greater than 92  and hypertension  Cath/PCI:  Left heart cath (09/2014, [facility]): I have personally reviewed the images. No significant CAD. Normal LVEF.   CV Surgery:  Left atrial myxoma resection (11/12/14, [place])  EP Procedures and Devices:  Event monitor (08/13/16): 30 day monitor with atrial fibrillation and SVT that may represent flutter identified. Frequent PVCs present. Mobitz type I second degree AV block also evident.  Event monitor (04/29/16): Patient monitored for 2 days on the 19th hours. Predominant rhythm was sinus with an average rate of 84 bpm. First degree and Mobitz type I second-degree AV block were observed. Occasional isolated PVCs and rare isolated PACs were identified. No prolonged pauses or sustained arrhythmias were seen.  Non-Invasive Evaluation(s):  Transthoracic echocardiogram (06/11/16): Normal LV size with mild focal basal hypertrophy of the septum. LVEF 60-65%. Trivial aortic regurgitation and mild mitral regurgitation noted. Normal RV size and function. Normal left and right atrial size.  Transthoracic echocardiogram (09/25/14): Normal LV size and function (EF greater than 55%) with grade 1 diastolic dysfunction. Mild left atrial enlargement with myxoma measuring up to 2.3 cm. Normal RV size and function.  Recent CV Pertinent Labs: Lab Results  Component Value Date   CHOL 257 (H) 06/17/2010   HDL 46.60 06/17/2010   LDLCALC 179 01/21/2009   LDLCALC 179 01/21/2009   LDLCALC 179 01/21/2009   LDLDIRECT 183.0 06/17/2010   TRIG 185.0 (H) 06/17/2010   CHOLHDL 6 06/17/2010   K 4.7 06/17/2010   BUN 13 06/17/2010   CREATININE 1.0 06/17/2010    Past medical and surgical history were reviewed and updated in EPIC.  Outpatient Encounter Prescriptions as of 08/26/2016  Medication Sig  . Ascorbic Acid (VITAMIN C) 500 MG CAPS Take 500 mg by mouth daily.  Marland Kitchen aspirin EC 81 MG tablet Take 81 mg by mouth daily.  . carvedilol (COREG) 6.25  MG tablet Take 1 tablet (6.25 mg total) by mouth  2 (two) times daily with a meal.  . Cholecalciferol (VITAMIN D PO) Take by mouth daily.  . metoprolol (LOPRESSOR) 50 MG tablet Take 50 mg by mouth 2 (two) times daily.  . TURMERIC PO Take by mouth daily.   No facility-administered encounter medications on file as of 08/26/2016.     Allergies: Red yeast rice  [cholestin] and Saccharin  Social History   Social History  . Marital status: Married    Spouse name: N/A  . Number of children: N/A  . Years of education: N/A   Occupational History  . Not on file.   Social History Main Topics  . Smoking status: Never Smoker  . Smokeless tobacco: Never Used  . Alcohol use No  . Drug use: No  . Sexual activity: Not on file   Other Topics Concern  . Not on file   Social History Narrative  . No narrative on file    Family History  Problem Relation Age of Onset  . Stroke Mother 58  . Heart attack Father 38  . Cancer Father     throat  . Cancer Sister 42    lung  . Cancer Brother     lung  . Heart attack Brother 70    Review of Systems: A 12-system review of systems was performed and was negative except as noted in the HPI.  --------------------------------------------------------------------------------------------------  Physical Exam: BP (!) 144/66 (BP Location: Left Arm, Patient Position: Sitting, Cuff Size: Normal)   Pulse 96   Ht 5\' 5"  (1.651 m)   Wt 194 lb 8 oz (88.2 kg)   BMI 32.37 kg/m   General:  Obese woman, seated comfortably in the exam room. HEENT: No conjunctival pallor or scleral icterus.  Moist mucous membranes.  OP clear. Neck: Supple without lymphadenopathy, thyromegaly, JVD, or HJR.  No carotid bruit. Lungs: Normal work of breathing.  Clear to auscultation bilaterally without wheezes or crackles. Heart: Regular rate and rhythm without murmurs, rubs, or gallops.  Non-displaced PMI. Abd: Bowel sounds present.  Soft, NT/ND without hepatosplenomegaly Ext: No lower extremity edema.  Radial, PT, and DP  pulses are 2+ bilaterally. Skin: warm and dry without rash  EKG:  Normal sinus rhythm with nonspecific T-wave abnormality. No significant change from prior tracing on 06/25/16 (I have personally reviewed both tracings).  No results found for: WBC, HGB, HCT, MCV, PLT  Lab Results  Component Value Date   NA 142 06/17/2010   K 4.7 06/17/2010   CL 104 06/17/2010   CO2 31 06/17/2010   BUN 13 06/17/2010   CREATININE 1.0 06/17/2010   GLUCOSE 87 06/17/2010   ALT 13 06/17/2010    Lab Results  Component Value Date   CHOL 257 (H) 06/17/2010   HDL 46.60 06/17/2010   LDLCALC 179 01/21/2009   LDLCALC 179 01/21/2009   LDLCALC 179 01/21/2009   LDLDIRECT 183.0 06/17/2010   TRIG 185.0 (H) 06/17/2010   CHOLHDL 6 06/17/2010    --------------------------------------------------------------------------------------------------  ASSESSMENT AND PLAN: Shortness of breath This is likely multifactorial. Her symptoms seem most associated with palpitations, suggesting that her dyspnea could be related to episodes of atrial fibrillation/flutter or SVT. Dr. Caryl Comes raised the possibility of a constrictive process, given cardiac surgery for myxoma removal. I have reviewed her echo images again today. There is no definitive evidence of a constrictive process, though evaluation is somewhat limited due to atrial arrhythmia affecting mitral inflow velocities and tissue  Doppler images. The patient appears euvolemic on exam today. I believe it would be best for her to follow-up with Dr. Caryl Comes for management of her arrhythmia issues. If she continues to have shortness of breath despite control of her arrhythmias, we would need to consider left and right heart catheterization to better understand her hemodynamics.  Atrial arrhythmias Recent 30 day event monitor showed episodes of atrial fibrillation (overall low burden) and possible atrial flutter versus SVT. Mobitz type I second-degree AV block was again identified. Given  the patient's CHADSVASC score of 4, we have agreed to initiate apixaban 5 mg twice a day for stroke prevention. We will obtain a basic metabolic panel and CBC today to establish baseline labs. We will continue her current dose of metoprolol; I will defer further adjustments and additional therapeutic interventions to Dr. Caryl Comes, whom the patient will be seeing later this month.  Hypertension Blood pressure is mildly elevated today. I will not make any changes at this time.  Follow-up: Return to clinic in 4 months.  Nelva Bush, MD 08/26/2016 9:44 AM

## 2016-08-26 NOTE — Patient Instructions (Addendum)
Medication Instructions:  Your physician has recommended you make the following change in your medication:  1- STOP taking Aspirin. 2- START taking Eliquis 5 mg (1 tablet) by mouth twice a day.   Labwork: Your physician recommends that you return for lab work in: TODAY (CBC, BMP). - Please go to the [facility]. You will check in at the front desk to the right as you walk into the atrium. Valet Parking is offered if needed.     Testing/Procedures: none  Follow-Up: Your physician wants you to follow-up in: 4 MONTHS WITH DR END.  You will receive a reminder letter in the mail two months in advance. If you don't receive a letter, please call our office to schedule the follow-up appointment.  If you need a refill on your cardiac medications before your next appointment, please call your pharmacy.  Medication Samples have been provided to the patient.  Drug name: Eliquis       Strength: 5 mg        Qty: 28 tablets (2 boxes)  LOT: [id number]  Exp.Date: March 2020

## 2016-08-28 ENCOUNTER — Telehealth: Payer: Self-pay | Admitting: Internal Medicine

## 2016-08-28 NOTE — Telephone Encounter (Signed)
S/w pt who reports she was told by pharmacist that Eliquis $500/month. She used the 30 day free card, has not started taking it as she doesn't want to  "start something I can't afford to take". Advised pt to stop aspirin and start eliquis 5mg  BID as instructed by Dr. Saunders Revel as she has 6 week supply at home. She was given 2 weeks of samples and the card at 3/7 OV. Educated pt on importance of taking medication and taking as prescribed.  Remains unsure if she will take Eliquis stating "If I don't then it's on me". Her funds are limited as she supports a 51 year old son and 4 year old grandchild. I provided web site and phone numbers to [facility] and [employer].  She understands to call them for assistance and to call back with the outcome. If she is still unable to afford it, will ask for further medication recommendations from Dr. Saunders Revel.

## 2016-08-28 NOTE — Telephone Encounter (Signed)
Pt calling she is not going to take Eliquis  She states her insurance won't cover it  It is about 500 a month Please advise

## 2016-09-17 ENCOUNTER — Ambulatory Visit (INDEPENDENT_AMBULATORY_CARE_PROVIDER_SITE_OTHER): Payer: Medicare Other | Admitting: Internal Medicine

## 2016-09-17 ENCOUNTER — Encounter: Payer: Self-pay | Admitting: Internal Medicine

## 2016-09-17 VITALS — BP 152/82 | HR 87 | Ht 66.0 in | Wt 194.5 lb

## 2016-09-17 DIAGNOSIS — R002 Palpitations: Secondary | ICD-10-CM | POA: Diagnosis not present

## 2016-09-17 DIAGNOSIS — I480 Paroxysmal atrial fibrillation: Secondary | ICD-10-CM | POA: Diagnosis not present

## 2016-09-17 MED ORDER — FUROSEMIDE 20 MG PO TABS: 20.0000 mg | ORAL_TABLET | Freq: Every day | ORAL | 3 refills | Status: DC

## 2016-09-17 MED ORDER — METOPROLOL TARTRATE 100 MG PO TABS: 100.0000 mg | ORAL_TABLET | Freq: Two times a day (BID) | ORAL | 3 refills | Status: DC

## 2016-09-17 NOTE — Patient Instructions (Addendum)
Medication Instructions: - Your physician has recommended you make the following change in your medication:  1) Increase metoprolol tartrate to 100 mg- take one tablet by mouth twice daily 2) Start lasix (furosemide) 20 mg- take one tablet by mouth once daily  Labwork: -  None ordered  Procedures/Testing: - none ordered  Follow-Up: - Your physician recommends that you schedule a follow-up appointment in: 2-3 months with Dr. Caryl Comes.  Any Additional Special Instructions Will Be Listed Below (If Applicable).     If you need a refill on your cardiac medications before your next appointment, please call your pharmacy.

## 2016-09-17 NOTE — Progress Notes (Signed)
Patient Care Team: Marinda Elk, MD as PCP - General (Physician Assistant)   HPI  Julie Duncan is a 76 y.o. female Seen in follow-up for palpitations that were the most prominent following resection of a left atrial myxoma Summary been ubiquitous with exercise; the others have been abrupt onset and offset. She is here to review an event recorder.  She also underwent treadmill testing during which her heart rate only went into the 110s during stage I; were frequent PVCs  She has felt somewhat better on metoprolol and on her own she increased it from 25-50 twice a day. She continues to struggle with dyspnea on exertion. There is no variation dated today. She continues to have some abrupt onset offset tachypalpitations.  She is not on anticoagulants at this time  Records and Results Reviewed  Event recorder demonstrated Mobitz 1 heart block; SVT, PVCs with a frequency of 0.5--4%.   Past Medical History:  Diagnosis Date  . Atrial myxoma   . Hyperlipidemia   . Hypertension   . Paroxysmal atrial fibrillation ([place]) 09/2014  . SVT (supraventricular tachycardia) ([facility])     Past Surgical History:  Procedure Laterality Date  . ANKLE SURGERY Left   . EXCISION OF ATRIAL MYXOMA    . PARTIAL HYSTERECTOMY      Current Outpatient Prescriptions  Medication Sig Dispense Refill  . Cholecalciferol (VITAMIN D PO) Take by mouth daily.    . metoprolol (LOPRESSOR) 50 MG tablet Take 50 mg by mouth 2 (two) times daily.    . Multiple Vitamin (MULTIVITAMIN) tablet Take 1 tablet by mouth daily.    . TURMERIC PO Take by mouth daily.     No current facility-administered medications for this visit.     Allergies  Allergen Reactions  . Red Yeast Rice  [Cholestin] Other (See Comments)  . Saccharin Other (See Comments)      Review of Systems negative except from HPI and PMH  Physical Exam BP (!) 152/82 (BP Location: Left Arm, Patient Position: Sitting, Cuff Size: Normal)    Pulse 87   Ht 5\' 6"  (1.676 m)   Wt 194 lb 8 oz (88.2 kg)   BMI 31.39 kg/m  Well developed and well nourished in no acute distress HENT normal E scleral and icterus clear Neck Supple JVP flat; carotids brisk and full Clear to ausculation regular rate and rhythm, no murmurs gallops or rub Soft with active bowel sounds No clubbing cyanosis  Edema Alert and oriented, grossly normal motor and sensory function Skin Warm and Dry  ECG from 7 March demonstrated sinus rhythm at 96 Intervals 20/07/34  Event recorder was reviewed again and with the patient.  Assessment and  Plan  Tachypalpitations  SVT  PVCs  Hypertension  Exercise intolerance  Status post myxoma resection  The onset of her tachycardia was never recorded. It is likely either AV reentry or flutter related to her myxoma surgery. I no longer think that is dysautonomic at this point. For right now we will increase metoprolol from 50--100 twice a day as she is also hypertensive.  In the event that this is unsatisfactory, we will add an antiarrhythmic as she also has PVCs which I think is contributing to her symptoms. In the event that this is unsuccessful in attenuating her tachypalpitations, we can consider catheter ablation of either reentrant SVT or atrial flutter related to her myxoma  Her exercise intolerance remains a little bit of a history. While she is not  volume overloaded, she does have LVH; EE' was not recorded.  I suspect she has some degree of HFpEF. We will try her on a diuretic furosemide 20 mg daily and see her she does. Last metabolic profile potassium 4.1 creatinine 0.9 to   Current medicines are reviewed at length with the patient today .  The patient does not  have concerns regarding medicines.

## 2016-09-22 ENCOUNTER — Ambulatory Visit: Payer: Medicare Other | Admitting: Internal Medicine

## 2016-10-01 ENCOUNTER — Ambulatory Visit: Payer: Medicare Other | Admitting: Internal Medicine

## 2016-10-08 ENCOUNTER — Telehealth: Payer: Self-pay | Admitting: Internal Medicine

## 2016-10-08 NOTE — Telephone Encounter (Signed)
No answer. Left message to call back.   

## 2016-10-08 NOTE — Telephone Encounter (Signed)
Patient says her metoprolol was recently changed to 100 mg po BID  Patient says her bp is still high she feels like she cant do anything          BP  HR 09/21/16  9pm   144/88  78  09/27/16  8 am   162/98  90    1015am  135/78  80    1030 am  129/77  77    09/29/16 1115 am  136/85  113    10/01/16 11 pm   156/86  72    10/02/16  9am   162/88  75  10/03/16 11 pm   140/74  68  10/06/16 1030 pm  167/90  76  10/08/16 830 am  165/92  79   845 am   156/83  66   9 am    153/85  65

## 2016-10-08 NOTE — Telephone Encounter (Signed)
Received incoming call from patient. She is concerned about her BP running high.  The readings are in the previous entry. Patient says she gets SOB whenever she's doing activities such as doing the dishes and has to go sit down. She is also exhausted and feels like she can't get all her house work done that she needs to. Denies chest pain or dizziness or headache. She does experience palpitations at times. Kept saying "All the time I thought it was my heart rate but its my blood pressure." She was out riding the lawnmower today but could not pick up the sticks in the yard. She went on to say she thinks its worse when she eats food but she knows she needs to eat. Patient says she takes her BP/HR after sitting to rest for at least 5 min and sometimes a couple hours. Its sporadic the time of day she takes her BP and its sometimes before her medication and sometimes after.   Advised to record BP/HR about 2 hours after taking her metoprolol. She verbalized understanding. Readings from today, 10/08/16, were prior to her taking metoprolol.  On 09/17/16, Dr Caryl Comes increased metoprolol tartrate to 100mg  BID and added lasix 20 mg daily.  Will route to Dr Caryl Comes for advice.

## 2016-10-12 NOTE — Telephone Encounter (Signed)
Julie Duncan Maybe Julie Duncan would be better at looking at this--or she could come by and see one of the PAs

## 2016-10-13 NOTE — Telephone Encounter (Signed)
Please have her see me in the office at her earliest convenience. She should continue to follow with Dr. Caryl Comes, as I suspect her underlying arrhythmias are also playing a role in her symptoms. Thanks.

## 2016-10-14 NOTE — Telephone Encounter (Signed)
Called patient to schedule an appt. She was currently driving on the highway and couldn't not look at her calendar at this time. She will call back to schedule an appt when she gets to her destination.

## 2016-10-15 NOTE — Telephone Encounter (Signed)
Called patient to schedule her to come in to se Dr End. She said she's not been feeling well with her sinuses and is congested. She does not feel like getting out. Offered to make an appt for her later next week and she refused, saying she will call back when she's ready to schedule. Stated she needed to get over this sinus and cold first.

## 2016-12-17 ENCOUNTER — Ambulatory Visit: Payer: Medicare Other | Admitting: Internal Medicine

## 2017-03-18 ENCOUNTER — Ambulatory Visit: Payer: Medicare Other | Admitting: Internal Medicine

## 2017-03-22 ENCOUNTER — Other Ambulatory Visit: Payer: Self-pay | Admitting: Physician Assistant

## 2017-03-22 DIAGNOSIS — Z1231 Encounter for screening mammogram for malignant neoplasm of breast: Secondary | ICD-10-CM

## 2017-04-08 ENCOUNTER — Ambulatory Visit
Admission: RE | Admit: 2017-04-08 | Discharge: 2017-04-08 | Disposition: A | Discharge status: Home / Self Care | Payer: Medicare Other | Source: Ambulatory Visit | Attending: Physician Assistant | Admitting: Physician Assistant

## 2017-04-08 DIAGNOSIS — Z1231 Encounter for screening mammogram for malignant neoplasm of breast: Secondary | ICD-10-CM | POA: Diagnosis not present

## 2017-04-14 ENCOUNTER — Encounter: Payer: Self-pay | Admitting: Internal Medicine

## 2017-04-14 ENCOUNTER — Ambulatory Visit (INDEPENDENT_AMBULATORY_CARE_PROVIDER_SITE_OTHER): Payer: Medicare Other | Admitting: Internal Medicine

## 2017-04-14 ENCOUNTER — Emergency Department
Admission: EM | Admit: 2017-04-14 | Discharge: 2017-04-15 | Disposition: A | Discharge status: Home / Self Care | Payer: Medicare Other | Attending: Emergency Medicine | Admitting: Emergency Medicine

## 2017-04-14 ENCOUNTER — Emergency Department: Payer: Medicare Other

## 2017-04-14 VITALS — BP 140/80 | HR 82 | Ht 66.0 in | Wt 192.5 lb

## 2017-04-14 DIAGNOSIS — Z79899 Other long term (current) drug therapy: Secondary | ICD-10-CM | POA: Diagnosis not present

## 2017-04-14 DIAGNOSIS — I10 Essential (primary) hypertension: Secondary | ICD-10-CM | POA: Diagnosis not present

## 2017-04-14 DIAGNOSIS — I471 Supraventricular tachycardia: Secondary | ICD-10-CM

## 2017-04-14 DIAGNOSIS — R079 Chest pain, unspecified: Secondary | ICD-10-CM | POA: Insufficient documentation

## 2017-04-14 DIAGNOSIS — R0609 Other forms of dyspnea: Secondary | ICD-10-CM

## 2017-04-14 LAB — COMPREHENSIVE METABOLIC PANEL
ALBUMIN: 3.9 g/dL (ref 3.5–5.0)
ALK PHOS: 85 U/L (ref 38–126)
ALT: 18 U/L (ref 14–54)
ANION GAP: 13 (ref 5–15)
AST: 21 U/L (ref 15–41)
BILIRUBIN TOTAL: 0.5 mg/dL (ref 0.3–1.2)
BUN: 16 mg/dL (ref 6–20)
CALCIUM: 9.1 mg/dL (ref 8.9–10.3)
CO2: 26 mmol/L (ref 22–32)
Chloride: 105 mmol/L (ref 101–111)
Creatinine, Ser: 0.9 mg/dL (ref 0.44–1.00)
GFR calc Af Amer: >60 mL/min (ref >60)
GFR calc non Af Amer: 60 mL/min — ABNORMAL LOW (ref >60)
GLUCOSE: 99 mg/dL (ref 65–99)
Potassium: 3.9 mmol/L (ref 3.5–5.1)
SODIUM: 144 mmol/L (ref 135–145)
Total Protein: 7.5 g/dL (ref 6.5–8.1)

## 2017-04-14 LAB — CBC
HEMATOCRIT: 39.8 % (ref 35.0–47.0)
HEMOGLOBIN: 13.2 g/dL (ref 12.0–16.0)
MCH: 30.2 pg (ref 26.0–34.0)
MCHC: 33.1 g/dL (ref 32.0–36.0)
MCV: 91.4 fL (ref 80.0–100.0)
Platelets: 298 10*3/uL (ref 150–440)
RBC: 4.36 MIL/uL (ref 3.80–5.20)
RDW: 14 % (ref 11.5–14.5)
WBC: 7 10*3/uL (ref 3.6–11.0)

## 2017-04-14 LAB — TROPONIN I: Troponin I: <0.03 ng/mL (ref <0.03)

## 2017-04-14 NOTE — Progress Notes (Signed)
Follow-up Outpatient Visit Date: 04/14/2017  Primary Care Provider: Marinda Elk, [place] [id number]  Chief Complaint: Shortness of breath and palpitations  HPI:  Julie Duncan is a 76 y.o. year-old female with history of supraventricular tachycardia, atrial myxoma status post resection at [facility] in 10/9161 complicated by post-operative a-fib, hypertension, and osteoarthritis, who presents for follow-up of palpitations and shortness of breath.  I last saw her in March, at which time Julie Duncan continued to have frequent palpitations.  We had tried switching her from metoprolol to carvedilol, which improved her palpitations but was not a good long-term solution due to profuse diarrhea with carvedilol.  She was subsequently seen by Dr. Caryl Comes, who felt that her tachyarrhythmia is likely AV reentry tachycardia or flutter related to her myxoma surgery.  Metoprolol was increased to 100 mg twice daily at that visit.  If her symptoms remained unsatisfactory, antiarrhythmic was suggested.  Today, Julie Duncan reports that she feels about the same as at our prior visit.  She still has frequent palpitations with even mild activity such as getting up and walking around her house.  The palpitations are associated with shortness of breath and generalized fatigue.  She has not had any chest pain, lightheadedness, or edema.  She has not had any side effects from escalation of metoprolol tartrate to 100 mg twice daily, though she feels as though it is not adequately controlling her symptoms.  --------------------------------------------------------------------------------------------------  Cardiovascular History & Procedures: Cardiovascular Problems:  Atrial myxoma status post resection  Paroxysmal atrial fibrillation following myxoma resection  Supraventricular tachycardia  Risk Factors:  Age greater than 66 and hypertension  Cath/PCI:  Left heart cath  (09/2014, [facility]): I have personally reviewed the images. No significant CAD. Normal LVEF.   CV Surgery:  Left atrial myxoma resection (11/12/14, [place])  EP Procedures and Devices:  Event monitor (08/13/16): 30 day monitor with atrial fibrillation and SVT that may represent flutter identified. Frequent PVCs present. Mobitz type I second degree AV block also evident.  Event monitor (04/29/16): Patient monitored for 2 days on the 19th hours. Predominant rhythm was sinus with an average rate of 84 bpm. First degree and Mobitz type I second-degree AV block were observed. Occasional isolated PVCs and rare isolated PACs were identified. No prolonged pauses or sustained arrhythmias were seen.  Non-Invasive Evaluation(s):  Transthoracic echocardiogram (06/11/16): Normal LV size with mild focal basal hypertrophy of the septum. LVEF 60-65%. Trivial aortic regurgitation and mild mitral regurgitation noted. Normal RV size and function. Normal left and right atrial size.  Transthoracic echocardiogram (09/25/14): Normal LV size and function (EF greater than 55%) with grade 1 diastolic dysfunction. Mild left atrial enlargement with myxoma measuring up to 2.3 cm. Normal RV size and function.  Recent CV Pertinent Labs: Lab Results  Component Value Date   CHOL 257 (H) 06/17/2010   HDL 46.60 06/17/2010   LDLCALC 179 01/21/2009   LDLCALC 179 01/21/2009   LDLCALC 179 01/21/2009   LDLDIRECT 183.0 06/17/2010   TRIG 185.0 (H) 06/17/2010   CHOLHDL 6 06/17/2010   K 4.1 08/26/2016   BUN 13 08/26/2016   CREATININE 0.92 08/26/2016    Past medical and surgical history were reviewed and updated in EPIC.  Current Meds  Medication Sig  . Cholecalciferol (VITAMIN D PO) Take by mouth daily.  . furosemide (LASIX) 20 MG tablet Take 1 tablet (20 mg total) by mouth daily.  . metoprolol (LOPRESSOR) 100 MG tablet Take 1  tablet (100 mg total) by mouth 2 (two) times daily.  . Multiple Vitamin (MULTIVITAMIN) tablet Take 1  tablet by mouth daily.  . TURMERIC PO Take by mouth daily.    Allergies: Red yeast rice  [cholestin] and Saccharin  Social History   Social History  . Marital status: Married    Spouse name: N/A  . Number of children: N/A  . Years of education: N/A   Occupational History  . Not on file.   Social History Main Topics  . Smoking status: Never Smoker  . Smokeless tobacco: Never Used  . Alcohol use No  . Drug use: No  . Sexual activity: Not on file   Other Topics Concern  . Not on file   Social History Narrative  . No narrative on file    Family History  Problem Relation Age of Onset  . Stroke Mother 43  . Heart attack Father 58  . Cancer Father        throat  . Cancer Sister 21       lung  . Cancer Brother        lung  . Heart attack Brother 84    Review of Systems: Patient notes chronic right hip and bilateral knee pain.  She wishes to defer surgical intervention as long as possible.  Otherwise, a 12-system review of systems was performed and was negative except as noted in the HPI.  --------------------------------------------------------------------------------------------------  Physical Exam: BP 140/80 (BP Location: Left Arm, Patient Position: Sitting, Cuff Size: Normal)   Pulse 82   Ht 5\' 6"  (1.676 m)   Wt 192 lb 8 oz (87.3 kg)   BMI 31.07 kg/m   General: Obese woman, seated comfortably in the exam room. HEENT: No conjunctival pallor or scleral icterus. Moist mucous membranes.  OP clear. Neck: Supple without lymphadenopathy, thyromegaly, JVD, or HJR.  Lungs: Normal work of breathing. Clear to auscultation bilaterally without wheezes or crackles. Heart: Regular rate and rhythm without murmurs, rubs, or gallops. Non-displaced PMI. Abd: Bowel sounds present. Soft, NT/ND without hepatosplenomegaly Ext: No lower extremity edema. Radial, PT, and DP pulses are 2+ bilaterally. Skin: Warm and dry without rash.  EKG: Normal sinus rhythm (heart rate 82 bpm)  with first-degree AV block (PR interval 364 ms).  Otherwise, no significant abnormalities.  Compared with prior tracing from 09/17/16, PR interval has lengthened considerably).  Lab Results  Component Value Date   WBC 5.9 08/26/2016   HGB 13.8 08/26/2016   HCT 40.5 08/26/2016   MCV 84.6 08/26/2016   PLT 320 08/26/2016    Lab Results  Component Value Date   NA 139 08/26/2016   K 4.1 08/26/2016   CL 107 08/26/2016   CO2 26 08/26/2016   BUN 13 08/26/2016   CREATININE 0.92 08/26/2016   GLUCOSE 94 08/26/2016   ALT 13 06/17/2010    Lab Results  Component Value Date   CHOL 257 (H) 06/17/2010   HDL 46.60 06/17/2010   LDLCALC 179 01/21/2009   LDLCALC 179 01/21/2009   LDLCALC 179 01/21/2009   LDLDIRECT 183.0 06/17/2010   TRIG 185.0 (H) 06/17/2010   CHOLHDL 6 06/17/2010    --------------------------------------------------------------------------------------------------  ASSESSMENT AND PLAN: Palpitations with history of SVT versus atrial flutter Despite escalation of metoprolol, Julie Duncan has not had any significant improvement in her palpitations.  EKG today shows sinus rhythm with significant first-degree AV block, increased from her prior tracing in March.  It appears that aggressive beta-blockade is not controlling her symptoms.  I will contact Dr. Caryl Comes about antiarrhythmic therapy and potential for decreasing metoprolol, given the lack of efficacy as well as lengthening PR interval.  Dyspnea on exertion This is been a chronic problem and seems to correspond to her palpitations.  I wonder if her tachyarrhythmia is precipitating the shortness of breath.  I have again looked at her most recent echo from 05/2016, which showed normal LVEF.  Unfortunately, diastolic function could not be assessed due to lack of tissue Doppler.  We have agreed to repeat an echocardiogram to evaluate for new structural abnormalities as well as to assess her diastolic function.  Hypertension Blood  pressure is mildly elevated today.  I will defer making medication changes at this time, pending further recommendations about beta-blockade and antiarrhythmic therapy by Dr. Caryl Comes.  Follow-up: Return to clinic in 4 months.  Nelva Bush, MD 04/14/2017 9:23 PM

## 2017-04-14 NOTE — ED Triage Notes (Signed)
Pt in with co chest pain since this am, no other symptoms. Saw pmd this am for the same and had ekg done then dc home with no further instructions. Pt states she is here for persistent chest pain and htn, bp at home was 217/100.

## 2017-04-14 NOTE — Patient Instructions (Signed)
Medication Instructions:  Your physician recommends that you continue on your current medications as directed. Please refer to the Current Medication list given to you today.   Labwork: none  Testing/Procedures: Your physician has requested that you have an echocardiogram. Echocardiography is a painless test that uses sound waves to create images of your heart. It provides your doctor with information about the size and shape of your heart and how well your heart's chambers and valves are working. This procedure takes approximately one hour. There are no restrictions for this procedure.    Follow-Up: Your physician recommends that you schedule a follow-up appointment in: 4 MONTHS WITH DR END.   If you need a refill on your cardiac medications before your next appointment, please call your pharmacy.   Echocardiogram An echocardiogram, or echocardiography, uses sound waves (ultrasound) to produce an image of your heart. The echocardiogram is simple, painless, obtained within a short period of time, and offers valuable information to your health care provider. The images from an echocardiogram can provide information such as:  Evidence of coronary artery disease (CAD).  Heart size.  Heart muscle function.  Heart valve function.  Aneurysm detection.  Evidence of a past heart attack.  Fluid buildup around the heart.  Heart muscle thickening.  Assess heart valve function.  Tell a health care provider about:  Any allergies you have.  All medicines you are taking, including vitamins, herbs, eye drops, creams, and over-the-counter medicines.  Any problems you or family members have had with anesthetic medicines.  Any blood disorders you have.  Any surgeries you have had.  Any medical conditions you have.  Whether you are pregnant or may be pregnant. What happens before the procedure? No special preparation is needed. Eat and drink normally. What happens during the  procedure?  In order to produce an image of your heart, gel will be applied to your chest and a wand-like tool (transducer) will be moved over your chest. The gel will help transmit the sound waves from the transducer. The sound waves will harmlessly bounce off your heart to allow the heart images to be captured in real-time motion. These images will then be recorded.  You may need an IV to receive a medicine that improves the quality of the pictures. What happens after the procedure? You may return to your normal schedule including diet, activities, and medicines, unless your health care provider tells you otherwise. This information is not intended to replace advice given to you by your health care provider. Make sure you discuss any questions you have with your health care provider. Document Released: 06/05/2000 Document Revised: 01/25/2016 Document Reviewed: 02/13/2013 Elsevier Interactive Patient Education  2017 [employer].

## 2017-04-14 NOTE — ED Provider Notes (Signed)
[facility] Emergency Department Provider Note  Time seen: 10:43 PM  I have reviewed the triage vital signs and the nursing notes.   HISTORY  Chief Complaint Chest Pain    HPI Julie Duncan is a 76 y.o. female with a past medical history of hypertension, hyperlipidemia, paroxysmal atrial fibrillation, SVT, presents to the emergency department for chest discomfort.  According to the patient since this morning she has been experiencing discomfort in the center of her chest she states comes and goes.  When it does, it last for approximately 1 minute and is somewhat sharp and then will go away for several minutes or longer before recurring.  Patient saw her cardiologist this morning Dr. Saunders Revel.  Patient states he performed an EKG and sent her home.  She continued to have intermittent chest discomfort throughout the day today.  This evening around 830 she checked her blood pressure and it was 659 systolic so the patient came to the emergency department for evaluation.  She states however shortly after arriving to the emergency department the chest pain went away and has not returned.  She states she has been getting this pain for many years but it has never lasted this long which is what concerned her along with her blood pressure tonight.  Currently she feels very well denies any symptoms at this time.  Denies any shortness of breath nausea or diaphoresis at any point tonight.  Blood pressure is currently 145/64.   Past Medical History:  Diagnosis Date  . Atrial myxoma   . Hyperlipidemia   . Hypertension   . Paroxysmal atrial fibrillation ([place]) 09/2014  . SVT (supraventricular tachycardia) [facility])     Patient Active Problem List   Diagnosis Date Noted  . Dyspnea on exertion 04/14/2017  . Paroxysmal atrial fibrillation ([place]) 08/26/2016  . MICROSCOPIC HEMATURIA 11/19/2009  . URINARY URGENCY 11/06/2009  . PELVIC  PAIN 11/06/2009  . TINNITUS 10/09/2009  . CHEST WALL PAIN,  ANTERIOR 09/13/2009  . UNSPECIFIED VITAMIN D DEFICIENCY 12/31/2008  . MITRAL REGURGITATION 12/31/2008  . HYPERLIPIDEMIA 11/21/2008  . Essential hypertension 11/21/2008  . PAROXYSMAL SUPRAVENTRICULAR TACHYCARDIA 11/21/2008  . ALLERGIC RHINITIS 11/21/2008  . GERD 11/21/2008  . OSTEOARTHRITIS 11/21/2008    Past Surgical History:  Procedure Laterality Date  . ANKLE SURGERY Left   . EXCISION OF ATRIAL MYXOMA    . PARTIAL HYSTERECTOMY      Prior to Admission medications   Medication Sig Start Date End Date Taking? Authorizing Provider  Cholecalciferol (VITAMIN D PO) Take by mouth daily.    [provider]  furosemide (LASIX) 20 MG tablet Take 1 tablet (20 mg total) by mouth daily. 09/17/16 04/14/17  Deboraha Sprang, MD  metoprolol (LOPRESSOR) 100 MG tablet Take 1 tablet (100 mg total) by mouth 2 (two) times daily. 09/17/16 04/14/17  Deboraha Sprang, MD  Multiple Vitamin (MULTIVITAMIN) tablet Take 1 tablet by mouth daily.    [provider]  TURMERIC PO Take by mouth daily.    [provider]    Allergies  Allergen Reactions  . Red Yeast Rice  [Cholestin] Other (See Comments)  . Saccharin Other (See Comments)    Family History  Problem Relation Age of Onset  . Stroke Mother 27  . Heart attack Father 21  . Cancer Father        throat  . Cancer Sister 47       lung  . Cancer Brother  lung  . Heart attack Brother 30    Social History Social History  Substance Use Topics  . Smoking status: Never Smoker  . Smokeless tobacco: Never Used  . Alcohol use No    Review of Systems Constitutional: Negative for fever. Cardiovascular: Intermittent sharp chest pains throughout the day since 8 AM. Respiratory: Negative for shortness of breath. Gastrointestinal: Negative for abdominal pain.  Negative for nausea. Musculoskeletal: Negative for leg pain or swelling All other ROS negative  ____________________________________________   PHYSICAL  EXAM:  VITAL SIGNS: ED Triage Vitals  Enc Vitals Group     BP 04/14/17 2050 (!) 194/109     Pulse Rate 04/14/17 2050 76     Resp 04/14/17 2050 18     Temp 04/14/17 2050 98.7 F (37.1 C)     Temp Source 04/14/17 2050 Oral     SpO2 04/14/17 2050 98 %     Weight 04/14/17 2048 192 lb (87.1 kg)     Height 04/14/17 2048 5\' 6"  (1.676 m)     Head Circumference --      Peak Flow --      Pain Score --      Pain Loc --      Pain Edu? --      Excl. in [place]? --     Constitutional: Alert and oriented. Well appearing and in no distress. Eyes: Normal exam ENT   Head: Normocephalic and atraumatic.   Mouth/Throat: Mucous membranes are moist. Cardiovascular: Normal rate, regular rhythm.  Respiratory: Normal respiratory effort without tachypnea nor retractions. Breath sounds are clear  Gastrointestinal: Soft and nontender. No distention.   Musculoskeletal: Nontender with normal range of motion in all extremities. No lower extremity tenderness or edema. Neurologic:  Normal speech and language. No gross focal neurologic deficits Skin:  Skin is warm, dry and intact.  Psychiatric: Mood and affect are normal.   ____________________________________________    EKG  EKG reviewed and interpreted by myself appears to show a sinus rhythm possible second-degree type I block, narrow QRS, normal axis, normal intervals besides what appears to be a prolonging PR interval.  No ST changes.  ____________________________________________    RADIOLOGY  Right atelectasis  ____________________________________________   INITIAL IMPRESSION / ASSESSMENT AND PLAN / ED COURSE  Pertinent labs & imaging results that were available during my care of the patient were reviewed by me and considered in my medical decision making (see chart for details).  Patient presents to the emergency department for chest pain intermittent since this morning but now completely resolved.  Saw her cardiologist this morning,  but due to hypertension came to the ER tonight.  Differential would include hypertension, ACS, chest wall pain, angina.  We will check labs, chest x-ray and continue to closely monitor. Patient's labs are largely within normal limits including a negative troponin.  X-ray is overall normal.  Patient's EKG appears to show a [employer] block but otherwise is normal no ST changes.  We will have the patient follow-up with cardiology.  Given the patient's ongoing discomfort throughout the day today we will repeat a troponin although the patient remains symptom-free in the emergency department.  I discussed his plan of care with the patient and she is agreeable to this plan.  Patient CARE signed out to oncoming physician second troponin pending.  ____________________________________________   FINAL CLINICAL IMPRESSION(S) / ED DIAGNOSES  Chest pain    Harvest Dark, MD 04/14/17 2255

## 2017-04-15 LAB — TROPONIN I: Troponin I: <0.03 ng/mL (ref <0.03)

## 2017-04-15 NOTE — ED Provider Notes (Signed)
-----------------------------------------   12:57 AM on 04/15/2017 -----------------------------------------  Repeat troponin remains unremarkable.  Patient will be discharged home with close follow-up with her cardiologist.  Strict return precautions given.  Patient verbalizes understanding and agrees with plan of care.   Paulette Blanch, MD 04/15/17 [phone]

## 2017-04-15 NOTE — Discharge Instructions (Signed)
Continue all medications as directed by your doctor.  Return to the ER for worsening symptoms, persistent vomiting, difficulty breathing or other concerns. °

## 2017-04-16 ENCOUNTER — Telehealth: Payer: Self-pay | Admitting: Internal Medicine

## 2017-04-16 DIAGNOSIS — I10 Essential (primary) hypertension: Secondary | ICD-10-CM

## 2017-04-16 DIAGNOSIS — R0609 Other forms of dyspnea: Secondary | ICD-10-CM

## 2017-04-16 NOTE — Telephone Encounter (Signed)
I have discussed Julie Duncan's symptoms with Dr. Caryl Comes, as well as recent EKGs showing new 1st degree AVB followed by [place] (while in ED). She feels well today. We will wean her off metoprolol (cut down to 50 mg BID x 1 week, then 25 mg BID, to be stopped day before f/u with Dr. Caryl Comes). Dr. Caryl Comes recommends ETT at the time of office visit scheduled next month. Further medication changes and testing to be deferred until after visit with Dr. Caryl Comes. These recommendations were discussed with [name] by phone.  Nelva Bush, MD [facility] Pager: [phone]

## 2017-04-16 NOTE — Telephone Encounter (Signed)
S/w patient. She verbalized understanding to come in earlier the day of her appointment for Stress test on 04/29/17 around 10-10:15. She verbalized understanding to not have caffeine for 24 hours prior and to wear walking shoes (sneakers). She verbalized instructions on taking metoprolol as well.

## 2017-04-23 ENCOUNTER — Other Ambulatory Visit: Payer: Self-pay

## 2017-04-23 ENCOUNTER — Ambulatory Visit (INDEPENDENT_AMBULATORY_CARE_PROVIDER_SITE_OTHER): Payer: Medicare Other

## 2017-04-23 DIAGNOSIS — I10 Essential (primary) hypertension: Secondary | ICD-10-CM

## 2017-04-23 DIAGNOSIS — I471 Supraventricular tachycardia, unspecified: Secondary | ICD-10-CM

## 2017-04-23 DIAGNOSIS — R0609 Other forms of dyspnea: Secondary | ICD-10-CM | POA: Diagnosis not present

## 2017-04-29 ENCOUNTER — Encounter: Payer: Self-pay | Admitting: Internal Medicine

## 2017-04-29 ENCOUNTER — Ambulatory Visit (INDEPENDENT_AMBULATORY_CARE_PROVIDER_SITE_OTHER): Payer: Medicare Other | Admitting: Internal Medicine

## 2017-04-29 ENCOUNTER — Ambulatory Visit (INDEPENDENT_AMBULATORY_CARE_PROVIDER_SITE_OTHER): Payer: Medicare Other

## 2017-04-29 VITALS — BP 140/78 | HR 100 | Ht 66.0 in | Wt 194.2 lb

## 2017-04-29 DIAGNOSIS — I440 Atrioventricular block, first degree: Secondary | ICD-10-CM

## 2017-04-29 DIAGNOSIS — R0609 Other forms of dyspnea: Secondary | ICD-10-CM

## 2017-04-29 DIAGNOSIS — I471 Supraventricular tachycardia: Secondary | ICD-10-CM

## 2017-04-29 DIAGNOSIS — I10 Essential (primary) hypertension: Secondary | ICD-10-CM | POA: Diagnosis not present

## 2017-04-29 MED ORDER — LOSARTAN POTASSIUM 50 MG PO TABS: 50.0000 mg | ORAL_TABLET | Freq: Every day | ORAL | 3 refills | Status: DC

## 2017-04-29 NOTE — Progress Notes (Signed)
Patient Care Team: Marinda Elk, MD as PCP - General (Physician Assistant)   HPI  Julie Duncan is a 76 y.o. female Seen in follow-up for palpitations that were the most prominent following resection of a left atrial myxoma Summary been ubiquitous with exercise; the others have been abrupt onset and offset. She is here to review an event recorder.  She also underwent treadmill testing during which her heart rate only went into the 110s during stage I; were frequent PVCs  She has felt somewhat better on metoprolol and on her own she increased it from 25-50 twice a day. She continues to struggle with dyspnea on exertion. There is no variation dated today. She continues to have some abrupt onset offset tachypalpitations.  She saw Dr. Saunders Revel a few weeks ago who noted that she was no better.  She comes in today for treadmill testing that he is scheduled.  She is in SVT at the onset.  She abruptly terminates tachycardia; not recorded ECGs demonstrated a pseudo-R prime in lead V1.  She is not on anticoagulants at this time  Records and Results Reviewed Echo 11/18 normal LV function   Date Cr K  10/18 0.9 3.9+          Event recorder demonstrated Mobitz 1 heart block; SVT, PVCs with a frequency of 0.5--4%.  She was also seen in the emergency room 10 days ago for chest pain associated with systolic hypertension blood pressure greater than 200 notes reviewed  Past Medical History:  Diagnosis Date  . Atrial myxoma   . Hyperlipidemia   . Hypertension   . Paroxysmal atrial fibrillation ([place]) 09/2014  . SVT (supraventricular tachycardia) ([facility])     Past Surgical History:  Procedure Laterality Date  . ANKLE SURGERY Left   . EXCISION OF ATRIAL MYXOMA    . PARTIAL HYSTERECTOMY      Current Outpatient Medications  Medication Sig Dispense Refill  . Cholecalciferol (VITAMIN D PO) Take by mouth daily.    . furosemide (LASIX) 20 MG tablet Take 1 tablet (20 mg total) by mouth  daily. 90 tablet 3  . Multiple Vitamin (MULTIVITAMIN) tablet Take 1 tablet by mouth daily.    . metoprolol (LOPRESSOR) 100 MG tablet Take 1 tablet (100 mg total) by mouth 2 (two) times daily. (Patient not taking: Reported on 04/29/2017) 180 tablet 3   No current facility-administered medications for this visit.     Allergies  Allergen Reactions  . Red Yeast Rice  [Cholestin] Other (See Comments)  . Saccharin Other (See Comments)      Review of Systems negative except from HPI and PMH  Physical Exam BP 140/78 (BP Location: Left Arm, Patient Position: Sitting, Cuff Size: Normal)   Pulse 100   Ht 5\' 6"  (1.676 m)   Wt 194 lb 4 oz (88.1 kg)   SpO2 96%   BMI 31.35 kg/m  Well developed and nourished in no acute distress HENT normal Neck supple with JVP-flat Clear Regular rate and rhythm, no murmurs or gallops Abd-soft with active BS No Clubbing cyanosis edema Skin-warm and dry A & Oriented  Grossly normal sensory and motor function   ECG from 7 March demonstrated sinus rhythm at 92 24/08/35 Intervals 20/07/34  Event recorder was reviewed again and with the patient.  Assessment and  Plan  SVT probably AV nodal reentry  First-degree AV block-infrequent Mobitz 1  PVCs  Hypertension  Exercise intolerance  Status post myxoma resection  Patient's  treadmill today serendipitously demonstrated tachycardia at the onset and with termination could identify the absence of a pseudoarthrotic present and tachycardia.  This would support a diagnosis of AV reentry and specifically AV nodal reentry.  With the frequency of these events and the disruption to life are such that she would like to proceed with catheter ablation.  Beta-blockers have been unhelpful.  I will refer the information to Dr. Elliot Cousin for consideration of ablation\  We will begin her on losartan for her hypertension.  Up titration of beta-blockers and/or use of calcium blockers little bit more of a challenge given  her first-degree AV block and Mobitz 1 heart block

## 2017-04-29 NOTE — Patient Instructions (Addendum)
Medication Instructions:  Your physician has recommended you make the following change in your medication:  START taking losartan 50mg  once daily   Labwork: none  Testing/Procedures: none  Follow-Up: You will receive a call from the office after Dr. Caryl Comes reviews with Dr. Lovena Le about your catheter ablation.    Any Other Special Instructions Will Be Listed Below (If Applicable).     If you need a refill on your cardiac medications before your next appointment, please call your pharmacy.   Cardiac Ablation Cardiac ablation is a procedure to stop some heart tissue from causing problems. The heart has many electrical connections. Sometimes these connections make the heart beat very fast or irregularly. Removing some problem areas can improve the heart rhythm or make it normal. What happens before the procedure?  Follow instructions from your doctor about what you cannot eat or drink.  Ask your doctor about: ? Changing or stopping your normal medicines. This is important if you take diabetes medicines or blood thinners. ? Taking medicines such as aspirin and ibuprofen. These medicines can thin your blood. Do not take these medicines before your procedure if your doctor tells you not to.  Plan to have someone take you home.  If you will be going home right after the procedure, plan to have someone with you for 24 hours. What happens during the procedure?  To lower your risk of infection: ? Your health care team will wash or sanitize their hands. ? Your skin will be washed with soap. ? Hair may be removed from your neck or groin.  An IV tube will be put into one of your veins.  You will be given a medicine to help you relax (sedative).  Skin on your neck or groin will be numbed.  A cut (incision) will be made in your neck or groin.  A needle will be put through your cut and into a vein in your neck or groin.  A tube (catheter) will be put into the needle. The tube will  be moved to your heart. X-rays (fluoroscopy) will be used to help guide the tube.  Small devices (electrodes) on the tip of the tube will send out electrical currents.  Dye may be put through the tube. This helps your surgeon see your heart.  Electrical energy will be used to scar (ablate) some heart tissue. Your surgeon may use: ? Heat (radiofrequency energy). ? Laser energy. ? Extreme cold (cryoablation).  The tube will be taken out.  Pressure will be held on your cut. This helps stop bleeding.  A bandage (dressing) will be put on your cut. The procedure may vary. What happens after the procedure?  You will be monitored until your medicines have worn off.  Your cut will be watched for bleeding. You will need to lie still for a few hours.  Do not drive for 24 hours or as long as your doctor tells you. Summary  Cardiac ablation is a procedure to stop some heart tissue from causing problems.  Electrical energy will be used to scar (ablate) some heart tissue. This information is not intended to replace advice given to you by your health care provider. Make sure you discuss any questions you have with your health care provider. Document Released: 02/08/2013 Document Revised: 04/27/2016 Document Reviewed: 04/27/2016 Elsevier Interactive Patient Education  2017 [employer].

## 2017-04-30 ENCOUNTER — Telehealth: Payer: Self-pay | Admitting: Internal Medicine

## 2017-04-30 NOTE — Telephone Encounter (Signed)
S/w pt to schedule AV node ablation as ordered by Dr. Caryl Comes at [facility] with Dr. Lovena Le. Pt would like to discuss it with her daughter, gather more information and call back with questions to be addressed before scheduling procedure. She will contact us when she is ready to schedule.

## 2017-05-03 ENCOUNTER — Ambulatory Visit (HOSPITAL_COMMUNITY): Admit: 2017-05-03 | Payer: Medicare Other | Admitting: Internal Medicine

## 2017-05-03 ENCOUNTER — Encounter (HOSPITAL_COMMUNITY): Payer: Self-pay

## 2017-05-03 SURGERY — AV NODE ABLATION
Anesthesia: LOCAL

## 2017-05-04 LAB — EXERCISE TOLERANCE TEST
CHL CUP RESTING HR STRESS: 144 {beats}/min
CSEPEDS: 33 s
CSEPHR: 126 %
Estimated workload: 4.6 METS
Exercise duration (min): 2 min
MPHR: 142 {beats}/min
Peak HR: 179 {beats}/min

## 2017-05-05 NOTE — Telephone Encounter (Signed)
Left message for Pt per DPR.  Notified Pt that if she would like to meet with Dr.Taylor prior to SVT ablation to call this nurse and set up appt.  Also notified Pt that if she would like to go ahead with procedure call this nurse-name and direct # left for call back.

## 2017-05-05 NOTE — Telephone Encounter (Signed)
Confirmed with Dr. Caryl Comes that patient needs SVT ablation.

## 2017-06-11 ENCOUNTER — Telehealth: Payer: Self-pay | Admitting: Internal Medicine

## 2017-06-11 NOTE — Telephone Encounter (Signed)
Attempted to r/s appt 1/22 End (not in office )   No ans no vm

## 2017-07-05 ENCOUNTER — Telehealth: Payer: Self-pay | Admitting: Internal Medicine

## 2017-07-05 NOTE — Telephone Encounter (Signed)
LMOV to r/s 07/13/17 appt - provider will be out of office

## 2017-07-09 NOTE — Telephone Encounter (Signed)
R/s to February

## 2017-07-13 ENCOUNTER — Ambulatory Visit: Payer: Medicare Other | Admitting: Internal Medicine

## 2017-08-04 ENCOUNTER — Ambulatory Visit: Payer: Medicare Other | Admitting: Internal Medicine

## 2018-03-23 ENCOUNTER — Other Ambulatory Visit: Payer: Self-pay | Admitting: Physician Assistant

## 2018-03-23 DIAGNOSIS — Z1231 Encounter for screening mammogram for malignant neoplasm of breast: Secondary | ICD-10-CM

## 2018-04-12 ENCOUNTER — Telehealth: Payer: Self-pay

## 2018-04-12 NOTE — Telephone Encounter (Signed)
SENT REFERRAL

## 2018-04-15 ENCOUNTER — Ambulatory Visit
Admission: RE | Admit: 2018-04-15 | Discharge: 2018-04-15 | Disposition: A | Discharge status: Home / Self Care | Payer: Medicare Other | Source: Ambulatory Visit | Attending: Physician Assistant | Admitting: Physician Assistant

## 2018-04-15 DIAGNOSIS — Z1231 Encounter for screening mammogram for malignant neoplasm of breast: Secondary | ICD-10-CM | POA: Diagnosis present

## 2018-09-26 ENCOUNTER — Encounter: Payer: Self-pay | Admitting: *Deleted

## 2018-11-21 ENCOUNTER — Other Ambulatory Visit: Payer: Self-pay | Admitting: *Deleted

## 2018-11-21 NOTE — Patient Outreach (Addendum)
[facility]) Care Management [place] screening outreach- insurance referral 11/21/2018  GLORA HULGAN 1941/03/19 [id number]  Follow up from [facility] H[facility] screening phone call placed to patient on September 26, 2018; HIPAA/ identity verified.  Screening call completed with patient; patient reports no unmanageable chronic conditions and denies ongoing needs, concerns/ issues.  No concerns or needs were identified during screening call   Plan:  Patient to be followed in HRA engaged program  Verified patient was mailed a patient welcome letter on September 26, 2018  Oneta Rack, RN, BSN, [employer] Coordinator [facility] Care Management  [phone]

## 2018-12-06 ENCOUNTER — Other Ambulatory Visit: Payer: Self-pay | Admitting: *Deleted

## 2018-12-06 NOTE — Patient Outreach (Signed)
[facility]) Care Management [facility] CM Case Closure note Patient active with external program 12/06/2018  Julie Duncan 04/19/1941 [id number]  [facility] Care Management Case Closure note from previously placed [facility] H[facility] screening phone call  Received confirmation from [facility] leadership team that patient is now active with an external program  Plan:  Will close patient case and make patient inactive with [facility] CM  Oneta Rack, RN, BSN, [place] Coordinator [facility] Care Management  [phone]

## 2019-01-25 DIAGNOSIS — D219 Benign neoplasm of connective and other soft tissue, unspecified: Secondary | ICD-10-CM | POA: Diagnosis not present

## 2019-01-25 DIAGNOSIS — E8881 Metabolic syndrome: Secondary | ICD-10-CM | POA: Diagnosis not present

## 2019-01-25 DIAGNOSIS — M1990 Unspecified osteoarthritis, unspecified site: Secondary | ICD-10-CM | POA: Diagnosis not present

## 2019-01-25 DIAGNOSIS — I119 Hypertensive heart disease without heart failure: Secondary | ICD-10-CM | POA: Diagnosis not present

## 2019-01-27 DIAGNOSIS — I10 Essential (primary) hypertension: Secondary | ICD-10-CM | POA: Diagnosis not present

## 2019-01-27 DIAGNOSIS — E7849 Other hyperlipidemia: Secondary | ICD-10-CM | POA: Diagnosis not present

## 2019-01-27 DIAGNOSIS — E034 Atrophy of thyroid (acquired): Secondary | ICD-10-CM | POA: Diagnosis not present

## 2019-01-27 DIAGNOSIS — R5381 Other malaise: Secondary | ICD-10-CM | POA: Diagnosis not present

## 2019-02-13 DIAGNOSIS — I119 Hypertensive heart disease without heart failure: Secondary | ICD-10-CM | POA: Diagnosis not present

## 2019-02-13 DIAGNOSIS — M1990 Unspecified osteoarthritis, unspecified site: Secondary | ICD-10-CM | POA: Diagnosis not present

## 2019-02-13 DIAGNOSIS — Z23 Encounter for immunization: Secondary | ICD-10-CM | POA: Diagnosis not present

## 2019-02-13 DIAGNOSIS — D219 Benign neoplasm of connective and other soft tissue, unspecified: Secondary | ICD-10-CM | POA: Diagnosis not present

## 2019-03-15 DIAGNOSIS — M25551 Pain in right hip: Secondary | ICD-10-CM | POA: Diagnosis not present

## 2019-03-15 DIAGNOSIS — M1611 Unilateral primary osteoarthritis, right hip: Secondary | ICD-10-CM | POA: Diagnosis not present

## 2019-03-29 DIAGNOSIS — M25551 Pain in right hip: Secondary | ICD-10-CM | POA: Diagnosis not present

## 2020-03-05 DIAGNOSIS — M67371 Transient synovitis, right ankle and foot: Secondary | ICD-10-CM | POA: Diagnosis not present

## 2020-03-05 DIAGNOSIS — M19071 Primary osteoarthritis, right ankle and foot: Secondary | ICD-10-CM | POA: Diagnosis not present

## 2020-03-05 DIAGNOSIS — M67372 Transient synovitis, left ankle and foot: Secondary | ICD-10-CM | POA: Diagnosis not present

## 2020-03-05 DIAGNOSIS — M19072 Primary osteoarthritis, left ankle and foot: Secondary | ICD-10-CM | POA: Diagnosis not present

## 2020-03-12 DIAGNOSIS — M67372 Transient synovitis, left ankle and foot: Secondary | ICD-10-CM | POA: Diagnosis not present

## 2020-03-12 DIAGNOSIS — M67371 Transient synovitis, right ankle and foot: Secondary | ICD-10-CM | POA: Diagnosis not present

## 2020-03-26 ENCOUNTER — Other Ambulatory Visit: Payer: Self-pay | Admitting: Internal Medicine

## 2020-03-26 DIAGNOSIS — M67372 Transient synovitis, left ankle and foot: Secondary | ICD-10-CM | POA: Diagnosis not present

## 2020-03-26 DIAGNOSIS — M67371 Transient synovitis, right ankle and foot: Secondary | ICD-10-CM | POA: Diagnosis not present

## 2020-04-10 DIAGNOSIS — M67371 Transient synovitis, right ankle and foot: Secondary | ICD-10-CM | POA: Diagnosis not present

## 2020-04-10 DIAGNOSIS — M7752 Other enthesopathy of left foot: Secondary | ICD-10-CM | POA: Diagnosis not present

## 2020-04-10 DIAGNOSIS — M7751 Other enthesopathy of right foot: Secondary | ICD-10-CM | POA: Diagnosis not present

## 2020-04-10 DIAGNOSIS — M67372 Transient synovitis, left ankle and foot: Secondary | ICD-10-CM | POA: Diagnosis not present

## 2020-04-23 ENCOUNTER — Ambulatory Visit: Payer: PPO | Admitting: Podiatry

## 2020-05-02 DIAGNOSIS — S82892A Other fracture of left lower leg, initial encounter for closed fracture: Secondary | ICD-10-CM | POA: Insufficient documentation

## 2020-05-03 ENCOUNTER — Encounter: Payer: Self-pay | Admitting: Podiatry

## 2020-05-03 ENCOUNTER — Ambulatory Visit: Payer: PPO | Admitting: Podiatry

## 2020-05-03 ENCOUNTER — Ambulatory Visit (INDEPENDENT_AMBULATORY_CARE_PROVIDER_SITE_OTHER): Payer: PPO

## 2020-05-03 ENCOUNTER — Other Ambulatory Visit: Payer: Self-pay | Admitting: Podiatry

## 2020-05-03 ENCOUNTER — Other Ambulatory Visit: Payer: Self-pay

## 2020-05-03 DIAGNOSIS — M19071 Primary osteoarthritis, right ankle and foot: Secondary | ICD-10-CM | POA: Diagnosis not present

## 2020-05-03 DIAGNOSIS — M659 Synovitis and tenosynovitis, unspecified: Secondary | ICD-10-CM

## 2020-05-03 DIAGNOSIS — M779 Enthesopathy, unspecified: Secondary | ICD-10-CM

## 2020-05-03 MED ORDER — CELECOXIB 100 MG PO CAPS: 100.0000 mg | ORAL_CAPSULE | Freq: Two times a day (BID) | ORAL | 2 refills | Status: DC

## 2020-05-03 NOTE — Patient Instructions (Signed)
Pre-Operative Instructions  Congratulations, you have decided to take an important step towards improving your quality of life.  You can be assured that the doctors and staff at [facility] will be with you every step of the way.  Here are some important things you should know:  1. Plan to be at the surgery center/hospital at least 1 (one) hour prior to your scheduled time, unless otherwise directed by the surgical center/hospital staff.  You must have a responsible adult accompany you, remain during the surgery and drive you home.  Make sure you have directions to the surgical center/hospital to ensure you arrive on time. 2. If you are having surgery at [facility] or  hospitals, you will need a copy of your medical history and physical form from your family physician within one month prior to the date of surgery. We will give you a form for your primary physician to complete.  3. We make every effort to accommodate the date you request for surgery.  However, there are times where surgery dates or times have to be moved.  We will contact you as soon as possible if a change in schedule is required.   4. No aspirin/ibuprofen for one week before surgery.  If you are on aspirin, any non-steroidal anti-inflammatory medications (Mobic, Aleve, Ibuprofen) should not be taken seven (7) days prior to your surgery.  You make take Tylenol for pain prior to surgery.  5. Medications - If you are taking daily heart and blood pressure medications, seizure, reflux, allergy, asthma, anxiety, pain or diabetes medications, make sure you notify the surgery center/hospital before the day of surgery so they can tell you which medications you should take or avoid the day of surgery. 6. No food or drink after midnight the night before surgery unless directed otherwise by surgical center/hospital staff. 7. No alcoholic beverages 24-hours prior to surgery.  No smoking 24-hours prior or 24-hours after  surgery. 8. Wear loose pants or shorts. They should be loose enough to fit over bandages, boots, and casts. 9. Don't wear slip-on shoes. Sneakers are preferred. 10. Bring your boot with you to the surgery center/hospital.  Also bring crutches or a walker if your physician has prescribed it for you.  If you do not have this equipment, it will be provided for you after surgery. 11. If you have not been contacted by the surgery center/hospital by the day before your surgery, call to confirm the date and time of your surgery. 12. Leave-time from work may vary depending on the type of surgery you have.  Appropriate arrangements should be made prior to surgery with your employer. 13. Prescriptions will be provided immediately following surgery by your doctor.  Fill these as soon as possible after surgery and take the medication as directed. Pain medications will not be refilled on weekends and must be approved by the doctor. 14. Remove nail polish on the operative foot and avoid getting pedicures prior to surgery. 15. Wash the night before surgery.  The night before surgery wash the foot and leg well with water and the antibacterial soap provided. Be sure to pay special attention to beneath the toenails and in between the toes.  Wash for at least three (3) minutes. Rinse thoroughly with water and dry well with a towel.  Perform this wash unless told not to do so by your physician.  Enclosed: 1 Ice pack (please put in freezer the night before surgery)   1 Hibiclens skin cleaner     Pre-op instructions  If you have any questions regarding the instructions, please do not hesitate to call our office.  [place]: [address], [place], [place] [address] -- [phone]  [place]: [address] [place], [place] [address] -- [phone]  [place]: [address], [place], [place] [address] -- [phone]   Website: [email or web address] 

## 2020-05-03 NOTE — Progress Notes (Signed)
   HPI: 79 y.o. very healthy female presenting today as a new patient referral from Dr. Hulen Luster, local podiatrist, for evaluation of bilateral ankle pain is been going on for approximately 2 years now.  Patient has a history of left ankle joint replacement approximately 6-7 years ago at [facility].  He states that over the last 2 years she has had pain and tenderness to the bilateral ankles.  She is received multiple conservative modalities including steroidal injections, anti-inflammatories with minimal relief.  She presents for further treatment evaluation  Past Medical History:  Diagnosis Date  . Atrial myxoma   . Hyperlipidemia   . Hypertension   . Paroxysmal atrial fibrillation ([place]) 09/2014  . SVT (supraventricular tachycardia) (HCC)      Physical Exam: General: The patient is alert and oriented x3 in no acute distress.  Dermatology: Skin is warm, dry and supple bilateral lower extremities. Negative for open lesions or macerations.  Vascular: Palpable pedal pulses bilaterally. No edema or erythema noted. Capillary refill within normal limits.  Neurological: Epicritic and protective threshold grossly intact bilaterally.   Musculoskeletal Exam: Range of motion within normal limits to all pedal and ankle joints bilateral. Muscle strength 5/5 in all groups bilateral.  Range of motion is actually very good to the left ankle joint.  Within normal limits.  There is pain on palpation overlying the lateral aspect of the bilateral ankle joints  Radiographic Exam:  Normal osseous mineralization. No fracture/dislocation/boney destruction.  Ankle joint replacement noted to the left ankle.  Appears in good alignment.  There is some joint space narrowing noted to the right ankle joint more T's consistent with findings of chronic DJD Assessment: 1. H/o left ankle joint replacement-6-7 years ago 2.  Ankle synovitis bilateral lateral aspects 3.  DJD right ankle   Plan of Care:  1. Patient evaluated.  X-Rays reviewed.  2. Today we discussed the conservative versus surgical management of the presenting pathology. The patient opts for surgical management. All possible complications and details of the procedure were explained. All patient questions were answered. No guarantees were expressed or implied. 3. Authorization for surgery was initiated today. Surgery will consist of right ankle arthroscopy with debridement 4.  Prescription for Celebrex 100 mg 2 times daily 5.  Recommend OTC Voltaren gel daily 6.  Return to clinic 1 week postop        Edrick Kins, DPM [facility]  Dr. Edrick Kins, DPM    [address], [place] [address]                Office [phone]  Fax [phone]

## 2020-06-11 ENCOUNTER — Emergency Department
Admission: EM | Admit: 2020-06-11 | Discharge: 2020-06-11 | Disposition: A | Discharge status: Home / Self Care | Payer: PPO | Attending: Emergency Medicine | Admitting: Emergency Medicine

## 2020-06-11 ENCOUNTER — Other Ambulatory Visit: Payer: Self-pay

## 2020-06-11 ENCOUNTER — Emergency Department: Payer: PPO

## 2020-06-11 ENCOUNTER — Emergency Department (INDEPENDENT_AMBULATORY_CARE_PROVIDER_SITE_OTHER): Payer: PPO

## 2020-06-11 ENCOUNTER — Telehealth: Payer: Self-pay

## 2020-06-11 ENCOUNTER — Encounter: Payer: Self-pay | Admitting: Emergency Medicine

## 2020-06-11 DIAGNOSIS — R7309 Other abnormal glucose: Secondary | ICD-10-CM | POA: Diagnosis not present

## 2020-06-11 DIAGNOSIS — R200 Anesthesia of skin: Secondary | ICD-10-CM | POA: Diagnosis not present

## 2020-06-11 DIAGNOSIS — R000 Tachycardia, unspecified: Secondary | ICD-10-CM | POA: Diagnosis not present

## 2020-06-11 DIAGNOSIS — I10 Essential (primary) hypertension: Secondary | ICD-10-CM | POA: Insufficient documentation

## 2020-06-11 DIAGNOSIS — R0609 Other forms of dyspnea: Secondary | ICD-10-CM | POA: Diagnosis not present

## 2020-06-11 DIAGNOSIS — I4891 Unspecified atrial fibrillation: Secondary | ICD-10-CM

## 2020-06-11 DIAGNOSIS — Z7982 Long term (current) use of aspirin: Secondary | ICD-10-CM | POA: Diagnosis not present

## 2020-06-11 DIAGNOSIS — R002 Palpitations: Secondary | ICD-10-CM | POA: Diagnosis not present

## 2020-06-11 DIAGNOSIS — Z79899 Other long term (current) drug therapy: Secondary | ICD-10-CM | POA: Diagnosis not present

## 2020-06-11 DIAGNOSIS — R5383 Other fatigue: Secondary | ICD-10-CM | POA: Diagnosis not present

## 2020-06-11 DIAGNOSIS — R0602 Shortness of breath: Secondary | ICD-10-CM | POA: Diagnosis not present

## 2020-06-11 DIAGNOSIS — Z86018 Personal history of other benign neoplasm: Secondary | ICD-10-CM | POA: Diagnosis not present

## 2020-06-11 DIAGNOSIS — R202 Paresthesia of skin: Secondary | ICD-10-CM | POA: Diagnosis not present

## 2020-06-11 LAB — CBC WITH DIFFERENTIAL/PLATELET
Abs Immature Granulocytes: 0.02 10*3/uL (ref 0.00–0.07)
Basophils Absolute: 0.1 10*3/uL (ref 0.0–0.1)
Basophils Relative: 1 %
Eosinophils Absolute: 0.1 10*3/uL (ref 0.0–0.5)
Eosinophils Relative: 2 %
HCT: 44.5 % (ref 36.0–46.0)
Hemoglobin: 14 g/dL (ref 12.0–15.0)
Immature Granulocytes: 0 %
Lymphocytes Relative: 29 %
Lymphs Abs: 1.9 10*3/uL (ref 0.7–4.0)
MCH: 28.6 pg (ref 26.0–34.0)
MCHC: 31.5 g/dL (ref 30.0–36.0)
MCV: 90.8 fL (ref 80.0–100.0)
Monocytes Absolute: 0.5 10*3/uL (ref 0.1–1.0)
Monocytes Relative: 7 %
Neutro Abs: 4 10*3/uL (ref 1.7–7.7)
Neutrophils Relative %: 61 %
Platelets: 322 10*3/uL (ref 150–400)
RBC: 4.9 MIL/uL (ref 3.87–5.11)
RDW: 13 % (ref 11.5–15.5)
WBC: 6.5 10*3/uL (ref 4.0–10.5)
nRBC: 0 % (ref 0.0–0.2)

## 2020-06-11 LAB — BASIC METABOLIC PANEL
Anion gap: 9 (ref 5–15)
BUN: 17 mg/dL (ref 8–23)
CO2: 26 mmol/L (ref 22–32)
Calcium: 9 mg/dL (ref 8.9–10.3)
Chloride: 103 mmol/L (ref 98–111)
Creatinine, Ser: 1.03 mg/dL — ABNORMAL HIGH (ref 0.44–1.00)
GFR, Estimated: 55 mL/min — ABNORMAL LOW (ref >60)
Glucose, Bld: 106 mg/dL — ABNORMAL HIGH (ref 70–99)
Potassium: 3.9 mmol/L (ref 3.5–5.1)
Sodium: 138 mmol/L (ref 135–145)

## 2020-06-11 LAB — MAGNESIUM: Magnesium: 2.1 mg/dL (ref 1.7–2.4)

## 2020-06-11 LAB — TSH: TSH: 1.048 u[IU]/mL (ref 0.350–4.500)

## 2020-06-11 LAB — BRAIN NATRIURETIC PEPTIDE: B Natriuretic Peptide: 201.6 pg/mL — ABNORMAL HIGH (ref 0.0–100.0)

## 2020-06-11 LAB — TROPONIN I (HIGH SENSITIVITY): Troponin I (High Sensitivity): 13 ng/L (ref <18)

## 2020-06-11 MED ORDER — METOPROLOL TARTRATE 25 MG PO TABS: 25.0000 mg | ORAL_TABLET | Freq: Two times a day (BID) | ORAL | 1 refills | Status: DC

## 2020-06-11 MED ORDER — METOPROLOL TARTRATE 25 MG PO TABS
25.0000 mg | ORAL_TABLET | Freq: Once | ORAL | Status: AC
  Administered 2020-06-11: 13:00:00 25 mg via ORAL
  Filled 2020-06-11: qty 1

## 2020-06-11 NOTE — Telephone Encounter (Signed)
Tried to get in touch with pt via phone, was not able to get in touch with pt, left a detail message, okay by DPR, then reach out to pt's daughter Brooke Bonito (on [place]) to advised about Dr. Tyrell Antonio advice for 14 day Zio monitor wear and f/u with Dr. Saunders Revel her establish cardiologist after monitor wear, probable 1st week of Feb 2022. [name] verbalized understanding, will explain to pt once d/c from the ER, educated [name] on how to apply monitor, wearing for 14 days, and how to mail monitor back in after 14 days. Understands to call 1-800 number for an issues with monitor. [name] also confirmed pt's mailing address. Otherwise all questions or concerns were address and no additional concerns at this time. Agreeable to plan, will call back for anything further.

## 2020-06-11 NOTE — Telephone Encounter (Signed)
-----   Message from Wellington Hampshire, MD sent at 06/11/2020 12:01 PM EST ----- This is a patient of Dr. Saunders Revel who came to the emergency room with palpitations and tachycardia and was noted to be in SVT. She converted to sinus rhythm without intervention. I asked them to start her on metoprolol 25 mg twice daily. It seems like there is concern about possible A. fib or flutter but I did not see that. Please arrange for her to have a 2-week ZIO monitor and follow-up with Dr. Saunders Revel after.

## 2020-06-11 NOTE — ED Triage Notes (Signed)
Patient to ER from [facility] clinic for c/o tachycardia. Patient was being seen at [facility] for parasthesia to feet, but was noted to be in SVT (140's) while at office. Cardiology suggested patient come to ER. Patient has h/o A-Fib, but had "tumor removed from heart three years ago" that resolved A-Fib, has not required medication since then. Patient asymptomatic other than shortness of breath with movement.

## 2020-06-11 NOTE — ED Provider Notes (Signed)
[facility] Emergency Department Provider Note   ____________________________________________   Event Date/Time   First MD Initiated Contact with Patient 06/11/20 1042     (approximate)  I have reviewed the triage vital signs and the nursing notes.   HISTORY  Chief Complaint Tachycardia    HPI Julie Duncan is a 79 y.o. female with past medical history of hypertension, hyperlipidemia, paroxysmal atrial fibrillation, and atrial myxoma status post resection who presents to the ED complaining of palpitations.  Patient reports that for the past few months whenever she gets up to move around she feels like her heart starts racing and she has trouble catching her breath.  She denies any associated chest pain, but this has caused major limitations in her activity level.  She has not had any fevers, cough, vomiting, bowel pain, pain or swelling in her legs.  She reports a history of atrial fibrillation but was told this was related to her atrial myxoma, which she states was taken out 3 years ago.  Since then, she has not required any medication for heart rate control or anticoagulation.  She presented to her PCPs office today for evaluation of the symptoms, referred to the ED when she was noted to have a heart rate in the 140s.        Past Medical History:  Diagnosis Date  . Atrial myxoma   . Hyperlipidemia   . Hypertension   . Paroxysmal atrial fibrillation ([place]) 09/2014  . SVT (supraventricular tachycardia) [facility])     Patient Active Problem List   Diagnosis Date Noted  . Fracture of left ankle 05/02/2020  . Dyspnea on exertion 04/14/2017  . Paroxysmal atrial fibrillation ([place]) 08/26/2016  . Chronic anticoagulation 12/06/2014  . History of atrial myxoma 12/06/2014  . Accelerated junctional rhythm 11/17/2014  . Junctional bradycardia 11/15/2014  . AV block, 1st degree 11/11/2014  . History of blood transfusion reaction 11/11/2014  . LVH (left ventricular  hypertrophy) 11/11/2014  . Nonrheumatic tricuspid (valve) insufficiency 10/25/2014  . Abscess of tendon sheath of shoulder 11/09/2012  . Ankle arthritis 07/27/2012  . MICROSCOPIC HEMATURIA 11/19/2009  . URINARY URGENCY 11/06/2009  . PELVIC  PAIN 11/06/2009  . TINNITUS 10/09/2009  . CHEST WALL PAIN, ANTERIOR 09/13/2009  . UNSPECIFIED VITAMIN D DEFICIENCY 12/31/2008  . MITRAL REGURGITATION 12/31/2008  . HYPERLIPIDEMIA 11/21/2008  . Essential hypertension 11/21/2008  . PAROXYSMAL SUPRAVENTRICULAR TACHYCARDIA 11/21/2008  . ALLERGIC RHINITIS 11/21/2008  . GERD 11/21/2008  . OSTEOARTHRITIS 11/21/2008    Past Surgical History:  Procedure Laterality Date  . ANKLE SURGERY Left   . EXCISION OF ATRIAL MYXOMA    . PARTIAL HYSTERECTOMY      Prior to Admission medications   Medication Sig Start Date End Date Taking? Authorizing Provider  ascorbic acid (VITAMIN C) 500 MG tablet Take by mouth.    [provider]  aspirin 81 MG EC tablet Take by mouth.    [provider]  celecoxib (CELEBREX) 100 MG capsule Take 1 capsule (100 mg total) by mouth 2 (two) times daily. 05/03/20   Edrick Kins, DPM  Cholecalciferol (VITAMIN D PO) Take by mouth daily.    [provider]  losartan (COZAAR) 50 MG tablet Take 1 tablet by mouth once daily 03/27/20   Cletis Athens, MD  metoprolol tartrate (LOPRESSOR) 25 MG tablet Take 1 tablet (25 mg total) by mouth 2 (two) times daily. 06/11/20 08/10/20  Blake Divine, MD    Allergies Red yeast rice  [cholestin]  and Saccharin  Family History  Problem Relation Age of Onset  . Stroke Mother 61  . Heart attack Father 17  . Cancer Father        throat  . Cancer Sister 25       lung  . Cancer Brother        lung  . Heart attack Brother 17    Social History Social History   Tobacco Use  . Smoking status: Never Smoker  . Smokeless tobacco: Never Used  Substance Use Topics  . Alcohol use: No  . Drug use: No    Review of  Systems  Constitutional: No fever/chills Eyes: No visual changes. ENT: No sore throat. Cardiovascular: Denies chest pain.  Positive for palpitations. Respiratory: Positive for dyspnea on exertion. Gastrointestinal: No abdominal pain.  No nausea, no vomiting.  No diarrhea.  No constipation. Genitourinary: Negative for dysuria. Musculoskeletal: Negative for back pain. Skin: Negative for rash. Neurological: Negative for headaches, focal weakness or numbness.  ____________________________________________   PHYSICAL EXAM:  VITAL SIGNS: ED Triage Vitals  Enc Vitals Group     BP      Pulse      Resp      Temp      Temp src      SpO2      Weight      Height      Head Circumference      Peak Flow      Pain Score      Pain Loc      Pain Edu?      Excl. in [place]?     Constitutional: Alert and oriented. Eyes: Conjunctivae are normal. Head: Atraumatic. Nose: No congestion/rhinnorhea. Mouth/Throat: Mucous membranes are moist. Neck: Normal ROM Cardiovascular: Normal rate, regular rhythm. Grossly normal heart sounds.  2+ radial pulses bilaterally. Respiratory: Normal respiratory effort.  No retractions. Lungs CTAB. Gastrointestinal: Soft and nontender. No distention. Genitourinary: deferred Musculoskeletal: No lower extremity tenderness nor edema. Neurologic:  Normal speech and language. No gross focal neurologic deficits are appreciated. Skin:  Skin is warm, dry and intact. No rash noted. Psychiatric: Mood and affect are normal. Speech and behavior are normal.  ____________________________________________   LABS (all labs ordered are listed, but only abnormal results are displayed)  Labs Reviewed  BASIC METABOLIC PANEL - Abnormal; Notable for the following components:      Result Value   Glucose, Bld 106 (*)    Creatinine, Ser 1.03 (*)    GFR, Estimated 55 (*)    All other components within normal limits  BRAIN NATRIURETIC PEPTIDE - Abnormal; Notable for the following  components:   B Natriuretic Peptide 201.6 (*)    All other components within normal limits  CBC WITH DIFFERENTIAL/PLATELET  MAGNESIUM  TSH  TROPONIN I (HIGH SENSITIVITY)   ____________________________________________  EKG  ED ECG REPORT I, Blake Divine, the attending physician, personally viewed and interpreted this ECG.   Date: 06/11/2020  EKG Time: 10:38  Rate: 142  Rhythm: Atrial flutter vs SVT  Axis: Normal  Intervals:none  ST&T Change: None  ED ECG REPORT I, Blake Divine, the attending physician, personally viewed and interpreted this ECG.   Date: 06/11/2020  EKG Time: 10:49  Rate: 98  Rhythm: normal sinus rhythm, PVC  Axis: Normal  Intervals:none  ST&T Change: None    PROCEDURES  Procedure(s) performed (including Critical Care):  .1-3 Lead EKG Interpretation Performed by: Blake Divine, MD Authorized by: Blake Divine, MD  Interpretation: normal     ECG rate:  90-100   ECG rate assessment: normal     Rhythm: sinus rhythm     Ectopy: none     Conduction: normal       ____________________________________________   INITIAL IMPRESSION / ASSESSMENT AND PLAN / ED COURSE       79 year old female with past medical history of hypertension, hyperlipidemia, paroxysmal atrial fibrillation, and atrial myxoma status post resection who presents to the ED complaining of tachycardia along with dyspnea on exertion for the past few months.  Initial EKG showed tachycardia in the 140s with regular rhythm but no clear P waves, most consistent with atrial flutter.  When patient taken back to her room and started on cardiac monitor, she was noted to be in normal sinus rhythm with heart rate in the 90s.  We will continue to monitor closely, patient states she is asymptomatic at rest.  We will check labs including troponin, magnesium, and TSH.  Also plan to check chest x-ray.  Anticipate discussion with cardiology to determine appropriate medication  management.  Lab work is unremarkable, chest x-ray reviewed by me and shows no focal infiltrate, edema, or effusion.  Patient has remained in normal sinus rhythm since her initial EKG, continues to deny chest pain or shortness of breath at rest.  Case discussed with Dr. Fletcher Anon of cardiology, who reviewed initial EKG.  He states this could represent atrial flutter or SVT, recommend starting patient on 25 mg metoprolol twice daily and he will arrange for Holter monitor but recommends holding off on anticoagulation for now.  Patient was given initial dose of metoprolol here in the ED, which she tolerated well.  She is appropriate for discharge home with cardiology follow-up, was counseled to return to the ED for new worsening symptoms.  Patient agrees with plan.      ____________________________________________   FINAL CLINICAL IMPRESSION(S) / ED DIAGNOSES  Final diagnoses:  Palpitations  Dyspnea on exertion     ED Discharge Orders         Ordered    metoprolol tartrate (LOPRESSOR) 25 MG tablet  2 times daily        06/11/20 1316           Note:  This document was prepared using Dragon voice recognition software and may include unintentional dictation errors.   Blake Divine, MD 06/11/20 1318

## 2020-06-12 NOTE — Telephone Encounter (Signed)
Patient returning call and advised of note below.

## 2020-06-15 DIAGNOSIS — I4891 Unspecified atrial fibrillation: Secondary | ICD-10-CM

## 2020-07-04 DIAGNOSIS — I4891 Unspecified atrial fibrillation: Secondary | ICD-10-CM | POA: Diagnosis not present

## 2020-07-10 ENCOUNTER — Telehealth: Payer: Self-pay | Admitting: *Deleted

## 2020-07-10 NOTE — Telephone Encounter (Signed)
No answer. Left message to call back.  Patient has appointment tomorrow with Dr End.

## 2020-07-10 NOTE — Progress Notes (Signed)
Outpatient Visit Date: 07/11/2020  Primary Care Provider: Marinda Elk, MD [facility] [place],  [place] [address]  Chief Complaint: Palpitations  HPI:  Julie Duncan is a 80 y.o. female who is being seen today for the evaluation of palpitations. She has a history of SVT, atrial myxoma status post resection (01/3418 at [facility]) complicated by postoperative atrial fibrillation, HTN, and osteoarthritis.  I last saw her in 03/2017, at which time she reported frequent palpitations.  She was subsequently seen by Dr. Caryl Comes (EP) and was noted to have AVNRT while on treadmill.  She was referred for catheter ablation with Dr. Lovena Le not appear this was ever undertaken.  [facility] emergency department last month complaining of palpitations.  He was discharged with an event monitor that showed numerous episodes of SVT.  Today, Julie Duncan reports that she continues to have frequent episodes of SVT, almost anytime she is moving about or eating.  Episodes last seconds to minutes and are very uncomfortable with associated palpitations and shortness of breath.  She notes random chest pain that are not necessarily associated with palpitations.  She denies lightheadedness and syncope.  She has not felt any better with addition of metoprolol.  She requests refill of losartan, as she has not been able to get a new Rx from her PCP.  --------------------------------------------------------------------------------------------------  Cardiovascular History & Procedures: Cardiovascular Problems:  PSVT  Atrial myxoma status post resection complicated by postoperative atrial fibrillation  Risk Factors:  Hypertension and age greater than 71  Cath/PCI:  None  CV Surgery:  None  EP Procedures and Devices:  14-day event monitor (06/11/2020): Sinus rhythm with frequent SVT (lasting up to 44 minutes, 47 seconds) as well as rare PACs, PVCs, and NSVT.  Episodes of Mobitz type I second-degree  AV block also noted.  Non-Invasive Evaluation(s):  Exercise tolerance test (04/29/2017): Poor exercise capacity without diagnostic EKG changes of ischemia.  Baseline narrow complex tachycardia with abrupt cessation with coughing during early recovery suggestive of supraventricular tachycardia.  TTE (04/23/2017): Normal LV size and wall thickness.  LVEF 60-65% with normal diastolic function.  Mild mitral and aortic regurgitation.  Mild to moderate tricuspid regurgitation.  Recent CV Pertinent Labs: Lab Results  Component Value Date   CHOL 257 (H) 06/17/2010   HDL 46.60 06/17/2010   LDLCALC 179 01/21/2009   LDLCALC 179 01/21/2009   LDLCALC 179 01/21/2009   LDLDIRECT 183.0 06/17/2010   TRIG 185.0 (H) 06/17/2010   CHOLHDL 6 06/17/2010   BNP 201.6 (H) 06/11/2020   K 3.9 06/11/2020   MG 2.1 06/11/2020   BUN 17 06/11/2020   CREATININE 1.03 (H) 06/11/2020    --------------------------------------------------------------------------------------------------  Past Medical History:  Diagnosis Date  . Atrial myxoma   . Hyperlipidemia   . Hypertension   . Paroxysmal atrial fibrillation ([place]) 09/2014  . SVT (supraventricular tachycardia) (HCC)     Past Surgical History:  Procedure Laterality Date  . ANKLE SURGERY Left   . EXCISION OF ATRIAL MYXOMA    . PARTIAL HYSTERECTOMY      Current Meds  Medication Sig  . ascorbic acid (VITAMIN C) 500 MG tablet Take by mouth.  Marland Kitchen aspirin 81 MG EC tablet Take by mouth.  . Cholecalciferol (VITAMIN D PO) Take by mouth daily.  . metoprolol tartrate (LOPRESSOR) 25 MG tablet Take 1 tablet (25 mg total) by mouth 2 (two) times daily.  . Misc Natural Products (OSTEO BI-FLEX/5-LOXIN ADVANCED PO) Osteo Bi-Flex    Allergies: Red yeast  rice  [cholestin] and Saccharin  Social History   Tobacco Use  . Smoking status: Never Smoker  . Smokeless tobacco: Never Used  Substance Use Topics  . Alcohol use: No  . Drug use: No    Family History  Problem  Relation Age of Onset  . Stroke Mother 43  . Heart attack Father 38  . Cancer Father        throat  . Cancer Sister 59       lung  . Cancer Brother        lung  . Heart attack Brother 70    Review of Systems: A 12-system review of systems was performed and was negative except as noted in the HPI.  --------------------------------------------------------------------------------------------------  Physical Exam: BP (!) 150/80 (BP Location: Right Arm, Patient Position: Sitting, Cuff Size: Normal)   Pulse 74   Ht 5\' 6"  (1.676 m)   Wt 183 lb (83 kg)   SpO2 97%   BMI 29.54 kg/m   General:  NAD. HEENT: No conjunctival pallor or scleral icterus. Facemask in place. Neck: Supple without lymphadenopathy, thyromegaly, JVD, or HJR. No carotid bruit. Lungs: Normal work of breathing. Clear to auscultation bilaterally without wheezes or crackles. Heart: Irregular rhythm without murmurs, rubs, or gallops. Abd: Bowel sounds present. Soft, NT/ND without hepatosplenomegaly Ext: No lower extremity edema. Radial, PT, and DP pulses are 2+ bilaterally Skin: Warm and dry without rash. Neuro: CNIII-XII intact. Strength and fine-touch sensation intact in upper and lower extremities bilaterally. Psych: Normal mood and affect.  EKG:  Normal sinus rhythm with Mobitz type 1 second degree AV block.  No change noted with moving from supine to standing or with mild exertion in the exam room.  Lab Results  Component Value Date   WBC 6.5 06/11/2020   HGB 14.0 06/11/2020   HCT 44.5 06/11/2020   MCV 90.8 06/11/2020   PLT 322 06/11/2020    Lab Results  Component Value Date   NA 138 06/11/2020   K 3.9 06/11/2020   CL 103 06/11/2020   CO2 26 06/11/2020   BUN 17 06/11/2020   CREATININE 1.03 (H) 06/11/2020   GLUCOSE 106 (H) 06/11/2020   ALT 18 04/14/2017    Lab Results  Component Value Date   CHOL 257 (H) 06/17/2010   HDL 46.60 06/17/2010   LDLCALC 179 01/21/2009   LDLCALC 179 01/21/2009    LDLCALC 179 01/21/2009   LDLDIRECT 183.0 06/17/2010   TRIG 185.0 (H) 06/17/2010   CHOLHDL 6 06/17/2010     --------------------------------------------------------------------------------------------------  ASSESSMENT AND PLAN: SVT, Mobitz type 1 second degree AV block, and shortness of breath: Julie Duncan was recently noted to have >3,000 episodes of SVT lasting up to almost 45 minutes.  EKG today shows Wenckebach, which was also noted on heart monitor.  I have reviewed her history and rhythm strips with Dr. Caryl Comes in the office today.  Given underlying conduction disease, escalation of metoprolol or addition of an antiarrhythmic agent is not a good option at this time.  Dr. Caryl Comes will review findings and event monitor results with Dr. Lovena Le to discuss ablation options.  We will continue current dose of metoprolol tartrate 25 mg twice daily pending further recommendations by Dr. Caryl Comes.  I will also arrange for an echocardiogram to assess for new structural abnormalities in the setting of more symptomatic SVT as well as history of atrial myxoma resection.  Hypertension: Blood pressure is mildly elevated.  In the setting of aforementioned rhythm disturbances, we will  tolerate mild hypertension.  Continue low-dose metoprolol and defer adding back ARB at this time.  Follow-up: Return to clinic in 1 months.  Approximately 50 minutes were spend on this encounter, of which more than 50% was spent counseling the patient and consulting with other providers in the office.  Nelva Bush, MD 07/12/2020 11:48 AM

## 2020-07-10 NOTE — Telephone Encounter (Signed)
-----   Message from Nelva Bush, MD sent at 07/09/2020  7:05 PM EST ----- Please let Julie Duncan know that her event monitor showed frequent episodes of elevated heart rates consistent with supraventricular tachycardia.  She is scheduled to see me later this week, at which time we can discuss this further.  Please also make arrangements for her to see Dr. Caryl Comes, who has previously seen her, for further management of her arrhythmias.

## 2020-07-11 ENCOUNTER — Other Ambulatory Visit: Payer: Self-pay

## 2020-07-11 ENCOUNTER — Ambulatory Visit: Payer: PPO | Admitting: Internal Medicine

## 2020-07-11 ENCOUNTER — Encounter: Payer: Self-pay | Admitting: Internal Medicine

## 2020-07-11 VITALS — BP 150/80 | HR 74 | Ht 66.0 in | Wt 183.0 lb

## 2020-07-11 DIAGNOSIS — R0602 Shortness of breath: Secondary | ICD-10-CM | POA: Diagnosis not present

## 2020-07-11 DIAGNOSIS — I471 Supraventricular tachycardia: Secondary | ICD-10-CM | POA: Diagnosis not present

## 2020-07-11 DIAGNOSIS — I10 Essential (primary) hypertension: Secondary | ICD-10-CM | POA: Diagnosis not present

## 2020-07-11 DIAGNOSIS — I4891 Unspecified atrial fibrillation: Secondary | ICD-10-CM

## 2020-07-11 DIAGNOSIS — I441 Atrioventricular block, second degree: Secondary | ICD-10-CM | POA: Diagnosis not present

## 2020-07-11 NOTE — Patient Instructions (Addendum)
Medication Instructions:  Your physician recommends that you continue on your current medications as directed. Please refer to the Current Medication list given to you today.  *If you need a refill on your cardiac medications before your next appointment, please call your pharmacy*  Lab Work: none If you have labs (blood work) drawn today and your tests are completely normal, you will receive your results only by: Marland Kitchen MyChart Message (if you have MyChart) OR . A paper copy in the mail If you have any lab test that is abnormal or we need to change your treatment, we will call you to review the results.  Testing/Procedures: Your physician has requested that you have an echocardiogram. Echocardiography is a painless test that uses sound waves to create images of your heart. It provides your doctor with information about the size and shape of your heart and how well your heart's chambers and valves are working. This procedure takes approximately one hour. There are no restrictions for this procedure. There is a possibility that an IV may need to be started during your test to inject an image enhancing agent. This is done to obtain more optimal pictures of your heart. Therefore we ask that you do at least drink some water prior to coming in to hydrate your veins.    Follow-Up: At [facility], you and your health needs are our priority.  As part of our continuing mission to provide you with exceptional heart care, we have created designated Provider Care Teams.  These Care Teams include your primary Cardiologist (physician) and Advanced Practice Providers (APPs -  Physician Assistants and Nurse Practitioners) who all work together to provide you with the care you need, when you need it.  We recommend signing up for the patient portal called "MyChart".  Sign up information is provided on this After Visit Summary.  MyChart is used to connect with patients for Virtual Visits (Telemedicine).  Patients are  able to view lab/test results, encounter notes, upcoming appointments, etc.  Non-urgent messages can be sent to your provider as well.   To learn more about what you can do with MyChart, go to [email or web address].    Your next appointment:   1 month(s)  The format for your next appointment:   In Person  Provider:   You may see DR Harrell Gave END or one of the following Advanced Practice Providers on your designated Care Team:    Murray Hodgkins, NP  Christell Faith, PA-C  Marrianne Mood, PA-C  Cadence Clinton, [place]  Laurann Montana, NP

## 2020-07-12 ENCOUNTER — Encounter: Payer: Self-pay | Admitting: Internal Medicine

## 2020-07-24 ENCOUNTER — Other Ambulatory Visit: Payer: Self-pay

## 2020-07-24 ENCOUNTER — Telehealth: Payer: Self-pay | Admitting: *Deleted

## 2020-07-24 ENCOUNTER — Encounter: Payer: Self-pay | Admitting: Internal Medicine

## 2020-07-24 ENCOUNTER — Ambulatory Visit: Payer: PPO | Admitting: Internal Medicine

## 2020-07-24 ENCOUNTER — Encounter (INDEPENDENT_AMBULATORY_CARE_PROVIDER_SITE_OTHER): Payer: Self-pay

## 2020-07-24 VITALS — BP 156/82 | HR 52 | Ht 66.0 in | Wt 187.0 lb

## 2020-07-24 DIAGNOSIS — I471 Supraventricular tachycardia: Secondary | ICD-10-CM

## 2020-07-24 DIAGNOSIS — I10 Essential (primary) hypertension: Secondary | ICD-10-CM | POA: Diagnosis not present

## 2020-07-24 LAB — BASIC METABOLIC PANEL
BUN/Creatinine Ratio: 18 (ref 12–28)
BUN: 15 mg/dL (ref 8–27)
CO2: 32 mmol/L — ABNORMAL HIGH (ref 20–29)
Calcium: 9.4 mg/dL (ref 8.7–10.3)
Chloride: 104 mmol/L (ref 96–106)
Creatinine, Ser: 0.85 mg/dL (ref 0.57–1.00)
GFR calc Af Amer: 74 mL/min/{1.73_m2} (ref >59)
GFR calc non Af Amer: 64 mL/min/{1.73_m2} (ref >59)
Glucose: 78 mg/dL (ref 65–99)
Potassium: 3.8 mmol/L (ref 3.5–5.2)
Sodium: 142 mmol/L (ref 134–144)

## 2020-07-24 LAB — CBC WITH DIFFERENTIAL/PLATELET
Basophils Absolute: 0 10*3/uL (ref 0.0–0.2)
Basos: 1 %
EOS (ABSOLUTE): 0.2 10*3/uL (ref 0.0–0.4)
Eos: 3 %
Hematocrit: 39.7 % (ref 34.0–46.6)
Hemoglobin: 13.1 g/dL (ref 11.1–15.9)
Lymphocytes Absolute: 2.1 10*3/uL (ref 0.7–3.1)
Lymphs: 34 %
MCH: 29.2 pg (ref 26.6–33.0)
MCHC: 33 g/dL (ref 31.5–35.7)
MCV: 89 fL (ref 79–97)
Monocytes Absolute: 0.6 10*3/uL (ref 0.1–0.9)
Monocytes: 10 %
Neutrophils Absolute: 3.3 10*3/uL (ref 1.4–7.0)
Neutrophils: 52 %
Platelets: 300 10*3/uL (ref 150–450)
RBC: 4.48 x10E6/uL (ref 3.77–5.28)
RDW: 13 % (ref 11.7–15.4)
WBC: 6.2 10*3/uL (ref 3.4–10.8)

## 2020-07-24 NOTE — Telephone Encounter (Signed)
Please reschedule pt's echo from 2/8 to anytime after 3/14. Thank you!

## 2020-07-24 NOTE — H&P (View-Only) (Signed)
HPI Julie Duncan is referred by Dr. Saunders Revel for evaluation of SVT. She is a pleasant 80 yo woman with a h/o HTN who has had problems with SVT for several years. She feels tired and anxious though she has minimal palpitations. She wore a cardiac monitor which demonstrated frequent episodes of SVT lasting minutes at 130/min. The spells start and stop suddenly and the RP is short. She also was noted to have some AVWB. She has not had syncope but notes "skipped beats" for several years.  Allergies  Allergen Reactions  . Red Yeast Rice  [Cholestin] Other (See Comments)  . Saccharin Other (See Comments)     Current Outpatient Medications  Medication Sig Dispense Refill  . ascorbic acid (VITAMIN C) 500 MG tablet Take 500 mg by mouth daily.    Marland Kitchen aspirin 81 MG EC tablet Take 81 mg by mouth daily.    . Cholecalciferol (VITAMIN D PO) Take by mouth daily.    . metoprolol tartrate (LOPRESSOR) 25 MG tablet Take 1 tablet (25 mg total) by mouth 2 (two) times daily. 60 tablet 1  . Misc Natural Products (OSTEO BI-FLEX/5-LOXIN ADVANCED PO) Osteo Bi-Flex     No current facility-administered medications for this visit.     Past Medical History:  Diagnosis Date  . Atrial myxoma   . Hyperlipidemia   . Hypertension   . Paroxysmal atrial fibrillation ([facility]) 09/2014  . SVT (supraventricular tachycardia) (HCC)     ROS:   All systems reviewed and negative except as noted in the HPI.   Past Surgical History:  Procedure Laterality Date  . ANKLE SURGERY Left   . EXCISION OF ATRIAL MYXOMA    . PARTIAL HYSTERECTOMY       Family History  Problem Relation Age of Onset  . Stroke Mother 59  . Heart attack Father 10  . Cancer Father        throat  . Cancer Sister 48       lung  . Cancer Brother        lung  . Heart attack Brother 42     Social History   Socioeconomic History  . Marital status: Married    Spouse name: Not on file  . Number of children: Not on file  . Years of education: Not  on file  . Highest education level: Not on file  Occupational History  . Not on file  Tobacco Use  . Smoking status: Never Smoker  . Smokeless tobacco: Never Used  Substance and Sexual Activity  . Alcohol use: No  . Drug use: No  . Sexual activity: Not on file  Other Topics Concern  . Not on file  Social History Narrative  . Not on file   Social Determinants of Health   Financial Resource Strain: Not on file  Food Insecurity: Not on file  Transportation Needs: Not on file  Physical Activity: Not on file  Stress: Not on file  Social Connections: Not on file  Intimate Partner Violence: Not on file     BP (!) 156/82   Pulse (!) 52   Ht 5\' 6"  (1.676 m)   Wt 187 lb (84.8 kg)   SpO2 96%   BMI 30.18 kg/m   Physical Exam:  Well appearing NAD HEENT: Unremarkable Neck:  No JVD, no thyromegally Lymphatics:  No adenopathy Back:  No CVA tenderness Lungs:  Clear with no wheezes HEART:  Regular rate rhythm, no murmurs, no rubs, no clicks  Abd:  soft, positive bowel sounds, no organomegally, no rebound, no guarding Ext:  2 plus pulses, no edema, no cyanosis, no clubbing Skin:  No rashes no nodules Neuro:  CN II through XII intact, motor grossly intact  EKG - reviewed. NSR with no pre-excitation  Assess/Plan: 1. SVT - I have discussed the indications/risks/benefits/goals/expectations of EP study and catheter ablation and she wishes to proceed. 2. HTN - her bp is up a bit today but she is anxious. This can be addressed after ablation.  Gregg Taylor,MD 

## 2020-07-24 NOTE — Patient Instructions (Addendum)
Medication Instructions:  Your physician recommends that you continue on your current medications as directed. Please refer to the Current Medication list given to you today.  Labwork: You will get lab work today:  BMP and CBC  Testing/Procedures: None ordered.  Follow-Up:  SEE INSTRUCTION LETTER  Any Other Special Instructions Will Be Listed Below (If Applicable).  If you need a refill on your cardiac medications before your next appointment, please call your pharmacy.    Cardiac electrophysiology: From cell to bedside (7th ed., pp. 1239-1252). [place], [place]: Elsevier.">  Cardiac Ablation Cardiac ablation is a procedure to destroy, or ablate, a small amount of heart tissue in very specific places. The heart has many electrical connections. Sometimes these connections are abnormal and can cause the heart to beat very fast or irregularly. Ablating some of the areas that cause problems can improve the heart's rhythm or return it to normal. Ablation may be done for people who:  Have Wolff-Parkinson-White syndrome.  Have fast heart rhythms (tachycardia).  Have taken medicines for an abnormal heart rhythm (arrhythmia) that were not effective or caused side effects.  Have a high-risk heartbeat that may be life-threatening. During the procedure, a small incision is made in the neck or the groin, and a long, thin tube (catheter) is inserted into the incision and moved to the heart. Small devices (electrodes) on the tip of the catheter will send out electrical currents. A type of X-ray (fluoroscopy) will be used to help guide the catheter and to provide images of the heart. Tell a health care provider about:  Any allergies you have.  All medicines you are taking, including vitamins, herbs, eye drops, creams, and over-the-counter medicines.  Any problems you or family members have had with anesthetic medicines.  Any blood disorders you have.  Any surgeries you have had.  Any  medical conditions you have, such as kidney failure.  Whether you are pregnant or may be pregnant. What are the risks? Generally, this is a safe procedure. However, problems may occur, including:  Infection.  Bruising and bleeding at the catheter insertion site.  Bleeding into the chest, especially into the sac that surrounds the heart. This is a serious complication.  Stroke or blood clots.  Damage to nearby structures or organs.  Allergic reaction to medicines or dyes.  Need for a permanent pacemaker if the normal electrical system is damaged. A pacemaker is a small computer that sends electrical signals to the heart and helps your heart beat normally.  The procedure not being fully effective. This may not be recognized until months later. Repeat ablation procedures are sometimes done. What happens before the procedure? Medicines Ask your health care provider about:  Changing or stopping your regular medicines. This is especially important if you are taking diabetes medicines or blood thinners.  Taking medicines such as aspirin and ibuprofen. These medicines can thin your blood. Do not take these medicines unless your health care provider tells you to take them.  Taking over-the-counter medicines, vitamins, herbs, and supplements. General instructions  Follow instructions from your health care provider about eating or drinking restrictions.  Plan to have someone take you home from the hospital or clinic.  If you will be going home right after the procedure, plan to have someone with you for 24 hours.  Ask your health care provider what steps will be taken to prevent infection. What happens during the procedure?  An IV will be inserted into one of your veins.  You will be   given a medicine to help you relax (sedative).  The skin on your neck or groin will be numbed.  An incision will be made in your neck or your groin.  A needle will be inserted through the incision  and into a large vein in your neck or groin.  A catheter will be inserted into the needle and moved to your heart.  Dye may be injected through the catheter to help your surgeon see the area of the heart that needs treatment.  Electrical currents will be sent from the catheter to ablate heart tissue in desired areas. There are three types of energy that may be used to do this: ? Heat (radiofrequency energy). ? Laser energy. ? Extreme cold (cryoablation).  When the tissue has been ablated, the catheter will be removed.  Pressure will be held on the insertion area to prevent a lot of bleeding.  A bandage (dressing) will be placed over the insertion area. The exact procedure may vary among health care providers and hospitals.   What happens after the procedure?  Your blood pressure, heart rate, breathing rate, and blood oxygen level will be monitored until you leave the hospital or clinic.  Your insertion area will be monitored for bleeding. You will need to lie still for a few hours to ensure that you do not bleed from the insertion area.  Do not drive for 24 hours or as long as told by your health care provider. Summary  Cardiac ablation is a procedure to destroy, or ablate, a small amount of heart tissue using an electrical current. This procedure can improve the heart rhythm or return it to normal.  Tell your health care provider about any medical conditions you may have and all medicines you are taking to treat them.  This is a safe procedure, but problems may occur. Problems may include infection, bruising, damage to nearby organs or structures, or allergic reactions to medicines.  Follow your health care provider's instructions about eating and drinking before the procedure. You may also be told to change or stop some of your medicines.  After the procedure, do not drive for 24 hours or as long as told by your health care provider. This information is not intended to replace  advice given to you by your health care provider. Make sure you discuss any questions you have with your health care provider. Document Revised: 04/17/2019 Document Reviewed: 04/17/2019 Elsevier Patient Education  2021 Elsevier Inc.   

## 2020-07-24 NOTE — Progress Notes (Signed)
HPI Julie Duncan is referred by Dr. Saunders Revel for evaluation of SVT. She is a pleasant 80 yo woman with a h/o HTN who has had problems with SVT for several years. She feels tired and anxious though she has minimal palpitations. She wore a cardiac monitor which demonstrated frequent episodes of SVT lasting minutes at 130/min. The spells start and stop suddenly and the RP is short. She also was noted to have some AVWB. She has not had syncope but notes "skipped beats" for several years.  Allergies  Allergen Reactions  . Red Yeast Rice  [Cholestin] Other (See Comments)  . Saccharin Other (See Comments)     Current Outpatient Medications  Medication Sig Dispense Refill  . ascorbic acid (VITAMIN C) 500 MG tablet Take 500 mg by mouth daily.    Marland Kitchen aspirin 81 MG EC tablet Take 81 mg by mouth daily.    . Cholecalciferol (VITAMIN D PO) Take by mouth daily.    . metoprolol tartrate (LOPRESSOR) 25 MG tablet Take 1 tablet (25 mg total) by mouth 2 (two) times daily. 60 tablet 1  . Misc Natural Products (OSTEO BI-FLEX/5-LOXIN ADVANCED PO) Osteo Bi-Flex     No current facility-administered medications for this visit.     Past Medical History:  Diagnosis Date  . Atrial myxoma   . Hyperlipidemia   . Hypertension   . Paroxysmal atrial fibrillation ([facility]) 09/2014  . SVT (supraventricular tachycardia) (HCC)     ROS:   All systems reviewed and negative except as noted in the HPI.   Past Surgical History:  Procedure Laterality Date  . ANKLE SURGERY Left   . EXCISION OF ATRIAL MYXOMA    . PARTIAL HYSTERECTOMY       Family History  Problem Relation Age of Onset  . Stroke Mother 59  . Heart attack Father 10  . Cancer Father        throat  . Cancer Sister 48       lung  . Cancer Brother        lung  . Heart attack Brother 42     Social History   Socioeconomic History  . Marital status: Married    Spouse name: Not on file  . Number of children: Not on file  . Years of education: Not  on file  . Highest education level: Not on file  Occupational History  . Not on file  Tobacco Use  . Smoking status: Never Smoker  . Smokeless tobacco: Never Used  Substance and Sexual Activity  . Alcohol use: No  . Drug use: No  . Sexual activity: Not on file  Other Topics Concern  . Not on file  Social History Narrative  . Not on file   Social Determinants of Health   Financial Resource Strain: Not on file  Food Insecurity: Not on file  Transportation Needs: Not on file  Physical Activity: Not on file  Stress: Not on file  Social Connections: Not on file  Intimate Partner Violence: Not on file     BP (!) 156/82   Pulse (!) 52   Ht 5\' 6"  (1.676 m)   Wt 187 lb (84.8 kg)   SpO2 96%   BMI 30.18 kg/m   Physical Exam:  Well appearing NAD HEENT: Unremarkable Neck:  No JVD, no thyromegally Lymphatics:  No adenopathy Back:  No CVA tenderness Lungs:  Clear with no wheezes HEART:  Regular rate rhythm, no murmurs, no rubs, no clicks  Abd:  soft, positive bowel sounds, no organomegally, no rebound, no guarding Ext:  2 plus pulses, no edema, no cyanosis, no clubbing Skin:  No rashes no nodules Neuro:  CN II through XII intact, motor grossly intact  EKG - reviewed. NSR with no pre-excitation  Assess/Plan: 1. SVT - I have discussed the indications/risks/benefits/goals/expectations of EP study and catheter ablation and she wishes to proceed. 2. HTN - her bp is up a bit today but she is anxious. This can be addressed after ablation.  Carleene Overlie Wren [place]aga,MD

## 2020-07-24 NOTE — Telephone Encounter (Signed)
Attempted to schedule.  LMOV to call office.  ° °

## 2020-07-24 NOTE — Telephone Encounter (Signed)
-----   Message from Damian Leavell, RN sent at 07/24/2020 12:06 PM EST ----- Regarding: reschedule echo and follow up with Gerald Stabs? Mrs. Badalamenti is scheduled for her SVT ablation on 07/30/20.  She has an echo scheduled for that day.  The echo needs to be cancelled/rescheduled and probably follow up with Gerald Stabs cancelled/rescheduled.  She will see Dr. Lovena Le 4 weeks post ablation.  So maybe move it out past then?  Sonia Baller

## 2020-07-25 NOTE — Telephone Encounter (Signed)
Patient returning call.  She will call back in march to reschedule appts.

## 2020-07-26 ENCOUNTER — Other Ambulatory Visit
Admission: RE | Admit: 2020-07-26 | Discharge: 2020-07-26 | Disposition: A | Discharge status: Home / Self Care | Payer: PPO | Source: Ambulatory Visit | Attending: Internal Medicine | Admitting: Internal Medicine

## 2020-07-26 ENCOUNTER — Other Ambulatory Visit: Payer: Self-pay

## 2020-07-26 DIAGNOSIS — Z20822 Contact with and (suspected) exposure to covid-19: Secondary | ICD-10-CM | POA: Insufficient documentation

## 2020-07-26 DIAGNOSIS — Z01812 Encounter for preprocedural laboratory examination: Secondary | ICD-10-CM | POA: Diagnosis not present

## 2020-07-27 LAB — SARS CORONAVIRUS 2 (TAT 6-24 HRS): SARS Coronavirus 2: NEGATIVE

## 2020-07-29 NOTE — Progress Notes (Signed)
Attempted to call patient regarding procedure for tomorrow.  Left voice mail on the following items  Arrival time 0530 Nothing to eat or drink after midnight No meds AM of procedure Responsible person to drive you home and stay with you for 24 hrs

## 2020-07-30 ENCOUNTER — Other Ambulatory Visit: Payer: Self-pay

## 2020-07-30 ENCOUNTER — Other Ambulatory Visit: Payer: PPO

## 2020-07-30 ENCOUNTER — Encounter (HOSPITAL_COMMUNITY): Payer: Self-pay | Admitting: Internal Medicine

## 2020-07-30 ENCOUNTER — Ambulatory Visit (HOSPITAL_COMMUNITY)
Admission: RE | Admit: 2020-07-30 | Discharge: 2020-07-30 | Disposition: A | Discharge status: Home / Self Care | Payer: PPO | Attending: Internal Medicine | Admitting: Internal Medicine

## 2020-07-30 ENCOUNTER — Other Ambulatory Visit: Payer: Self-pay | Admitting: Student

## 2020-07-30 ENCOUNTER — Encounter (HOSPITAL_COMMUNITY)
Admission: RE | Disposition: A | Discharge status: Home / Self Care | Payer: Self-pay | Source: Home / Self Care | Attending: Internal Medicine

## 2020-07-30 DIAGNOSIS — I471 Supraventricular tachycardia: Secondary | ICD-10-CM

## 2020-07-30 DIAGNOSIS — Z79899 Other long term (current) drug therapy: Secondary | ICD-10-CM | POA: Diagnosis not present

## 2020-07-30 DIAGNOSIS — I10 Essential (primary) hypertension: Secondary | ICD-10-CM | POA: Insufficient documentation

## 2020-07-30 DIAGNOSIS — Z7982 Long term (current) use of aspirin: Secondary | ICD-10-CM | POA: Insufficient documentation

## 2020-07-30 HISTORY — PX: SVT ABLATION: EP1225

## 2020-07-30 SURGERY — SVT ABLATION

## 2020-07-30 MED ORDER — MIDAZOLAM HCL 5 MG/5ML IJ SOLN
INTRAMUSCULAR | Status: AC
  Filled 2020-07-30: qty 5

## 2020-07-30 MED ORDER — SODIUM CHLORIDE 0.9% FLUSH: 3.0000 mL | INTRAVENOUS | Status: DC | PRN

## 2020-07-30 MED ORDER — MIDAZOLAM HCL 5 MG/5ML IJ SOLN
INTRAMUSCULAR | Status: DC | PRN
  Administered 2020-07-30 (×3): 1 mg via INTRAVENOUS
  Administered 2020-07-30: 2 mg via INTRAVENOUS
  Administered 2020-07-30 (×3): 1 mg via INTRAVENOUS

## 2020-07-30 MED ORDER — FENTANYL CITRATE (PF) 100 MCG/2ML IJ SOLN
INTRAMUSCULAR | Status: DC | PRN
  Administered 2020-07-30 (×3): 12.5 ug via INTRAVENOUS
  Administered 2020-07-30: 25 ug via INTRAVENOUS
  Administered 2020-07-30 (×2): 12.5 ug via INTRAVENOUS

## 2020-07-30 MED ORDER — SODIUM CHLORIDE 0.9% FLUSH: 3.0000 mL | Freq: Two times a day (BID) | INTRAVENOUS | Status: DC

## 2020-07-30 MED ORDER — FENTANYL CITRATE (PF) 100 MCG/2ML IJ SOLN
INTRAMUSCULAR | Status: AC
  Filled 2020-07-30: qty 2

## 2020-07-30 MED ORDER — ONDANSETRON HCL 4 MG/2ML IJ SOLN: 4.0000 mg | Freq: Four times a day (QID) | INTRAMUSCULAR | Status: DC | PRN

## 2020-07-30 MED ORDER — SODIUM CHLORIDE 0.9 % IV SOLN
INTRAVENOUS | Status: DC | PRN
  Administered 2020-07-30: 4 ug/min via INTRAVENOUS

## 2020-07-30 MED ORDER — ACETAMINOPHEN 325 MG PO TABS
650.0000 mg | ORAL_TABLET | ORAL | Status: DC | PRN
  Filled 2020-07-30: qty 2

## 2020-07-30 MED ORDER — HEPARIN (PORCINE) IN NACL 1000-0.9 UT/500ML-% IV SOLN
INTRAVENOUS | Status: DC | PRN
  Administered 2020-07-30: 500 mL

## 2020-07-30 MED ORDER — BUPIVACAINE HCL (PF) 0.25 % IJ SOLN
INTRAMUSCULAR | Status: AC
  Filled 2020-07-30: qty 30

## 2020-07-30 MED ORDER — BUPIVACAINE HCL (PF) 0.25 % IJ SOLN
INTRAMUSCULAR | Status: DC | PRN
  Administered 2020-07-30: 50 mL

## 2020-07-30 MED ORDER — ISOPROTERENOL HCL 0.2 MG/ML IJ SOLN: INTRAVENOUS | Status: DC | PRN

## 2020-07-30 MED ORDER — SODIUM CHLORIDE 0.9 % IV SOLN: INTRAVENOUS | Status: DC

## 2020-07-30 MED ORDER — ISOPROTERENOL HCL 0.2 MG/ML IJ SOLN
INTRAMUSCULAR | Status: AC
  Filled 2020-07-30: qty 5

## 2020-07-30 MED ORDER — SODIUM CHLORIDE 0.9 % IV SOLN: 250.0000 mL | INTRAVENOUS | Status: DC | PRN

## 2020-07-30 MED ORDER — HEPARIN (PORCINE) IN NACL 1000-0.9 UT/500ML-% IV SOLN
INTRAVENOUS | Status: AC
  Filled 2020-07-30: qty 500

## 2020-07-30 SURGICAL SUPPLY — 11 items
BAG SNAP BAND KOVER 36X36 (MISCELLANEOUS) ×2 IMPLANT
CATH CELSIUS THERMO F CV 7FR (ABLATOR) ×2 IMPLANT
CATH JOSEPHSON QUAD-ALLRED 6FR (CATHETERS) ×4 IMPLANT
CATH POLARIS X 2.5/5/2.5 DECAP (CATHETERS) ×2 IMPLANT
MAT PREVALON FULL STRYKER (MISCELLANEOUS) ×2 IMPLANT
PACK EP LATEX FREE (CUSTOM PROCEDURE TRAY) ×2
PACK EP LF (CUSTOM PROCEDURE TRAY) ×1 IMPLANT
PAD PRO RADIOLUCENT 2001M-C (PAD) ×2 IMPLANT
SHEATH PINNACLE 6F 10CM (SHEATH) ×4 IMPLANT
SHEATH PINNACLE 7F 10CM (SHEATH) ×2 IMPLANT
SHEATH PINNACLE 8F 10CM (SHEATH) ×2 IMPLANT

## 2020-07-30 NOTE — Progress Notes (Signed)
79fr sheaths ( x2) and 46fr sheath aspirated and removed from right femoral vein. Manual pressure applied for 20 minutes. Site rebled, Pressure resumed for an additional 20 minutes.   Site level 0, no s+s of hematoma. Tegaderm dressing applied,bedrest instructions given. Right dp pulse palpable.  Bedrest begins at 10:50:00

## 2020-07-30 NOTE — Discharge Instructions (Signed)
Cardiac Ablation, Care After This sheet gives you information about how to care for yourself after your procedure. Your health care provider may also give you more specific instructions. If you have problems or questions, contact your health care provider. What can I expect after the procedure? After the procedure, it is common to have:  Bruising around the insertion site.  Tenderness around the insertion site.  Skipped heartbeats.  Tiredness (fatigue). Follow these instructions at home: Insertion site care  Follow instructions from your health care provider about how to take care of your insertion site. Make sure you: ? Wash your hands with soap and water for at least 20 seconds before and after you change your bandage (dressing). If soap and water are not available, use hand sanitizer. ? Change your dressing as told by your health care provider. ? Leave stitches (sutures), skin glue, or adhesive strips in place. These skin closures may need to stay in place for up to 2 weeks. If adhesive strip edges start to loosen and curl up, you may trim the loose edges. Do not remove adhesive strips completely unless your health care provider tells you to do that.  Check your insertion site every day for signs of infection. Check for: ? More redness, swelling, or pain. ? Fluid or blood. ? Warmth. ? Pus or a bad smell.  If your insertion site starts to bleed, lie down on your back, apply firm pressure to the area, and contact your health care provider.   Driving  If you were given a sedative during the procedure, it can affect you for several hours. Do not drive or operate machinery until your health care provider says that it is safe.  Ask your health care provider when it is safe for you to drive again after the procedure.   Activity  Avoid activities that take a lot of effort for at least 3 days after your procedure.  Do not lift anything that is heavier than 10 lb (4.5 kg), or the limit  that you are told, until your health care provider says that it is safe.  Return to your normal activities as told by your health care provider. Ask your health care provider what activities are safe for you.   General instructions  Take over-the-counter and prescription medicines only as told by your health care provider.  Do not use any products that contain nicotine or tobacco, such as cigarettes, e-cigarettes, and chewing tobacco. If you need help quitting, ask your health care provider.  Do not take baths, swim, or use a hot tub until your health care provider approves. Ask your health care provider if you may take showers. You may only be allowed to take sponge baths.  Do not drink alcohol for 24 hours after your procedure.  Keep all follow-up visits as told by your health care provider. This is important. Contact a health care provider if you:  Notice these things around the catheter insertion site: ? More redness, swelling, or pain. ? Fluid or blood that stops after applying firm pressure to the area. ? Warmth when you touch the area. ? Pus or a bad smell.  Have a fever.  Are sweating a lot.  Feel nauseous.  Have pain or numbness in the arm or leg closest to your insertion site. Get help right away if:  Your insertion site suddenly swells.  Your insertion site is bleeding and the bleeding does not stop after applying firm pressure to the area.  You  have chest pain or discomfort that spreads to your neck, jaw, or arm.  You have a fast or irregular heartbeat.  You have shortness of breath.  You are dizzy or light-headed and feel the need to lie down. These symptoms may represent a serious problem that is an emergency. Do not wait to see if the symptoms will go away. Get medical help right away. Call your local emergency services (911 in the [place] Do not drive yourself to the hospital. Summary  After the procedure, it is common to have bruising and tenderness at the  insertion site in your groin or your neck.  Check your insertion site every day for more redness, swelling, or pain. These are signs of infection.  Contact a health care provider if you notice any signs of infection. Also, contact a health care provider if you start sweating a lot, feel nauseous, or have pain or numbness in the arm or leg closest to your insertion site.  Get help right away if your puncture site is bleeding and the bleeding does not stop after applying firm pressure to the area. This is a medical emergency. This information is not intended to replace advice given to you by your health care provider. Make sure you discuss any questions you have with your health care provider. Document Revised: 04/17/2019 Document Reviewed: 04/17/2019 Elsevier Patient Education  [place].

## 2020-07-30 NOTE — Interval H&P Note (Signed)
History and Physical Interval Note:  07/30/2020 7:36 AM  Julie Duncan  has presented today for surgery, with the diagnosis of svt.  The various methods of treatment have been discussed with the patient and family. After consideration of risks, benefits and other options for treatment, the patient has consented to  Procedure(s): SVT ABLATION (N/A) as a surgical intervention.  The patient's history has been reviewed, patient examined, no change in status, stable for surgery.  I have reviewed the patient's chart and labs.  Questions were answered to the patient's satisfaction.     Julie Duncan

## 2020-07-30 NOTE — Interval H&P Note (Signed)
History and Physical Interval Note:  07/30/2020 7:38 AM  Julie Duncan  has presented today for surgery, with the diagnosis of svt.  The various methods of treatment have been discussed with the patient and family. After consideration of risks, benefits and other options for treatment, the patient has consented to  Procedure(s): SVT ABLATION (N/A) as a surgical intervention.  The patient's history has been reviewed, patient examined, no change in status, stable for surgery.  I have reviewed the patient's chart and labs.  Questions were answered to the patient's satisfaction.     Cristopher Peru

## 2020-07-30 NOTE — Progress Notes (Signed)
Dr Lovena Le in to see client and okay to d/c home at 1700 per Dr Lovena Le

## 2020-08-02 ENCOUNTER — Other Ambulatory Visit: Payer: Self-pay

## 2020-08-02 ENCOUNTER — Ambulatory Visit (INDEPENDENT_AMBULATORY_CARE_PROVIDER_SITE_OTHER): Payer: PPO

## 2020-08-02 ENCOUNTER — Ambulatory Visit (INDEPENDENT_AMBULATORY_CARE_PROVIDER_SITE_OTHER): Payer: PPO | Admitting: *Deleted

## 2020-08-02 VITALS — BP 152/72 | HR 70 | Ht 67.0 in | Wt 187.0 lb

## 2020-08-02 DIAGNOSIS — I441 Atrioventricular block, second degree: Secondary | ICD-10-CM

## 2020-08-02 DIAGNOSIS — R002 Palpitations: Secondary | ICD-10-CM | POA: Diagnosis not present

## 2020-08-02 NOTE — Progress Notes (Signed)
1.) Reason for visit: EKG post SVT ablation  2.) Name of MD requesting visit: Dr. Maggie Schwalbe  3.) H&P: Had SVT ablation 07/30/20, EKG ordered on discharge- with instructions to call APP after.   4.) ROS related to problem: pt reports feeling lousy, tired, feeling heart skipping, head feels heavy.  No lightheadness or dizziness.  Reports has not taken metoprolol since ablation.  BP 152/72  5.) Assessment and plan per MD: Per Dr. Johney Frame (DOD) and Tommye Standard, APP, EKG shows Mobitz Type I, HR is 70.  This is not new rhythm for her.  But with her reported complaints above, order 7 day Zio AT monitor and also stop beta blocker.   Pt reports that she was taking losartan 50 mg prior and ran out.  She went for the appointment to get refills and that is when she was found to be in SVT.  So she never got the medication refilled.  She would like to know if okay to restart this and if so I will send a new prescription to [facility].  Her follow up is scheduled for 08/20/20 with Jonni Sanger T.

## 2020-08-02 NOTE — Addendum Note (Signed)
Addended by: Rodman Key on: 08/02/2020 12:10 PM   Modules accepted: Level of Service

## 2020-08-02 NOTE — Patient Instructions (Signed)
Medication Instructions:  Your physician has recommended you make the following change in your medication:  1.) stop metoprolol  *If you need a refill on your cardiac medications before your next appointment, please call your pharmacy*   Lab Work: none  Testing/Procedures: Zio Monitor for 7 days - see details below.   Follow-Up: As scheduled with Joesph July, APP  Other Instructions ZIO AT Long term monitor-Live Telemetry  Your physician has requested you wear a ZIO patch monitor for 7 days.  This is a single patch monitor. [employer] supplies one patch monitor per enrollment. Additional stickers are not available.  Please do not apply patch if you will be having a Nuclear Stress Test, Echocardiogram, Cardiac CT, MRI, or Chest Xray during the time frame you would be wearing the monitor. The patch cannot be worn during these tests. You cannot remove and re-apply the ZIO AT patch monitor.   Your ZIO patch monitor will be sent Fed Ex from [employer] directly to your home address. The monitor may also be mailed to a PO BOX if home delivery is not available. It may take 3-5 days to receive your monitor after you have been enrolled.  Once you have received you monitor, please review enclosed instructions. Your monitor has already been registered assigning a specific monitor serial # to you.   Applying the monitor  Shave hair from upper left chest.  Hold abrader disc by orange tab. Rub abrader in 40 strokes over left upper chest as indicated in your monitor instructions.  Clean area with 4 enclosed alcohol pads. Use all pads to ensure the area is cleaned thoroughly. Let dry.  Apply patch as indicated in monitor instructions. Patch will be placed under collarbone on left side of chest with arrow pointing upward.  Rub patch adhesive wings for 2 minutes. Remove the white label marked "1". Remove the white label marked "2". Rub patch adhesive wings for 2 additional minutes.  While  looking in a mirror, press and release button in center of patch. A small green light will flash 3-4 times. This will be your only indicator the monitor has been turned on.  Do not shower for the first 24 hours. You may shower after the first 24 hours.  Press the button if you feel a symptom. You will hear a small click. Record Date, Time and Symptom in the Patient Log.   Starting the Gateway  In your kit there is a Hydrographic surveyor box the size of a cellphone. This is Airline pilot. It transmits all your recorded data to [facility]. This box must stay within 10 feet of you at all times. Open the box and push the * button. There will be a light that blinks orange and then green a few times. When the light stops blinking, the Gateway is connected to the ZIO patch.  Call [employer] at [phone] to confirm your monitor is transmitting.   Returning your monitor  Remove your patch and place it inside the [place]. In the lower half of the Gateway there is a white bag with prepaid postage on it. Place Gateway in bag and seal. Mail package back to [place] as soon as possible. Your physician should have your final report approximately 7 days after you have mailed back your monitor.   Call [place] at [phone] if you have questions regarding your ZIO AT patch monitor. Call them immediately if you see an orange light blinking on your monitor.  If your monitor falls  off in less than 4 days contact our Monitor department at [phone]. If your monitor becomes loose or falls off after 4 days call [employer] at [phone] for suggestions on securing your monitor.

## 2020-08-06 ENCOUNTER — Telehealth: Payer: Self-pay

## 2020-08-06 DIAGNOSIS — I441 Atrioventricular block, second degree: Secondary | ICD-10-CM | POA: Diagnosis not present

## 2020-08-06 DIAGNOSIS — R002 Palpitations: Secondary | ICD-10-CM

## 2020-08-06 DIAGNOSIS — I10 Essential (primary) hypertension: Secondary | ICD-10-CM

## 2020-08-06 MED ORDER — LOSARTAN POTASSIUM 50 MG PO TABS: 50.0000 mg | ORAL_TABLET | Freq: Every day | ORAL | 3 refills | Status: DC

## 2020-08-06 NOTE — Telephone Encounter (Signed)
Patient called in and stated that you called her 08/05/2020 and she said there was not a message left and wants to know if you can give her a call back. Patient states she leaves her house around 11 am and not sure when she will be back and if she doesn't answer if you can leave a detailed message.

## 2020-08-06 NOTE — Telephone Encounter (Signed)
Reviewed EKG and Dr. Tanna Furry recommendation from 2/11.   He agreed with holding Metoprolol and Zio AT.   With AFL post ablation we will use Zio AT to make decision on [place]. Discussed with patient AFL would impose a risk of stroke. She agrees to watchful waiting with Zio AT and understands we will start [facility] if further AFL or AF is identified.   She also states her BP has been quite high at home.  She had ran out of her Losartan 50 mg daily. We will refill this and send to pharmacy in [place]. Will check labs at follow up 3/1.   Legrand Como [address] [place], PA-C  08/06/2020 9:50 AM

## 2020-08-07 ENCOUNTER — Telehealth: Payer: Self-pay | Admitting: Student

## 2020-08-07 DIAGNOSIS — I441 Atrioventricular block, second degree: Secondary | ICD-10-CM | POA: Diagnosis not present

## 2020-08-07 DIAGNOSIS — R002 Palpitations: Secondary | ICD-10-CM | POA: Diagnosis not present

## 2020-08-07 NOTE — Telephone Encounter (Signed)
Cardiac report : 1:06 -1:36 pm: CHB  1:57 - 2:05 pm CHB. avg HR 47. Awaiting fax

## 2020-08-07 NOTE — Telephone Encounter (Signed)
[employer] called in with abnormal EKG  Ref#  [id number]  Est number [phone]

## 2020-08-07 NOTE — Telephone Encounter (Signed)
Attempted to return call, wrong number provided. Will await [employer] fax with abnormal EKG. Will route to A. Tillery to view strip once uploaded into Epic.

## 2020-08-08 ENCOUNTER — Telehealth: Payer: Self-pay

## 2020-08-08 ENCOUNTER — Telehealth: Payer: Self-pay | Admitting: Student

## 2020-08-08 NOTE — Telephone Encounter (Addendum)
Kim with [employer] called to report the pt had a "possible CHB" with HR 47 at 11 am this morning similar to the strips sent earlier today. She spoke with the pt and she says she was watching TV and was asymptomatic.   Please see previous Dr. Debby Freiberg message from earlier today. Pt to continue to monitor per Dr. Fernanda Drum.   Will file the strips to the chart.   Pt to call back if she develops any problems.

## 2020-08-08 NOTE — Telephone Encounter (Signed)
Received [employer] event for 08/07/2020 2:35 :46pm.  Possible CHB minimum HR 44bpm  - max rate 59bpm with avg HR 49bpm.  8 episodes of AV block (3rd) lasting 42 seconds.  Sharolyn Douglas, NP notified at 540pm - [place] with pt who reports no symptoms.  Pt states she restarted Metoprolol yesterday as she found her discharge papers from the hospital which indicated she should still be on the medication.  Advised pt per Oda Kilts, PA note from 08/06/2020 pt should be holding Metoprolol.  Pt states her discharge papers say differently.  Pt advised to hold Metoprolol unless further instructed to take.  Alert taken to Dr Lovena Le for review and recommendation.  Dr Lovena Le advises to continue monitoring.  Pt verbalizes understanding and agrees with current plan.

## 2020-08-08 NOTE — Telephone Encounter (Signed)
Kelly from [facility] calling with abnormal EKG results.

## 2020-08-08 NOTE — Telephone Encounter (Signed)
Monitor alert received for 02/17 at 1229pm for CHB - 43-63bpm.  Spoke with pt who reports no symptoms at time of alert and advised to continue monitoring and reiterated to not take Metoprolol as requested by provider.   Will contact pt for any additional recommendations. Pt verbalizes understanding and thanked Therapist, sports for call.   Alert taken to Dr Lovena Le who advises to continue monitoring.

## 2020-08-12 ENCOUNTER — Telehealth: Payer: Self-pay | Admitting: Student

## 2020-08-12 ENCOUNTER — Ambulatory Visit: Payer: PPO | Admitting: Nurse Practitioner

## 2020-08-12 NOTE — Telephone Encounter (Signed)
    Claiborne Billings with [employer] calling to report critical zio result

## 2020-08-12 NOTE — Telephone Encounter (Signed)
Took call from [place].  Per Kelly-first noted afib strip.  Request Claiborne Billings fax strip to office.  Still awaiting rhythm strip.

## 2020-08-14 NOTE — Telephone Encounter (Signed)
Outreach made to [employer] on 08/13/20 to request strip be refaxed to office.  Strip received and viewed by Dr. Lovena Le.  Per Dr. Lovena Le strip does show afib.    Pt will need follow up to discuss.  Spoke with Pt's daughter.  Pt scheduled to see Dr. Saunders Revel 08/15/20 at 1:20 pm to discuss afib found on monitor.

## 2020-08-15 ENCOUNTER — Encounter: Payer: Self-pay | Admitting: Internal Medicine

## 2020-08-15 ENCOUNTER — Other Ambulatory Visit: Payer: Self-pay

## 2020-08-15 ENCOUNTER — Ambulatory Visit: Payer: PPO | Admitting: Internal Medicine

## 2020-08-15 VITALS — BP 150/90 | HR 88 | Ht 67.0 in | Wt 187.0 lb

## 2020-08-15 DIAGNOSIS — I10 Essential (primary) hypertension: Secondary | ICD-10-CM | POA: Diagnosis not present

## 2020-08-15 DIAGNOSIS — I441 Atrioventricular block, second degree: Secondary | ICD-10-CM | POA: Insufficient documentation

## 2020-08-15 DIAGNOSIS — I471 Supraventricular tachycardia: Secondary | ICD-10-CM | POA: Diagnosis not present

## 2020-08-15 DIAGNOSIS — I480 Paroxysmal atrial fibrillation: Secondary | ICD-10-CM

## 2020-08-15 DIAGNOSIS — R079 Chest pain, unspecified: Secondary | ICD-10-CM | POA: Diagnosis not present

## 2020-08-15 MED ORDER — LOSARTAN POTASSIUM 50 MG PO TABS: 50.0000 mg | ORAL_TABLET | Freq: Two times a day (BID) | ORAL | 3 refills | Status: DC

## 2020-08-15 MED ORDER — APIXABAN 5 MG PO TABS: 5.0000 mg | ORAL_TABLET | Freq: Two times a day (BID) | ORAL | 3 refills | Status: DC

## 2020-08-15 NOTE — Progress Notes (Signed)
Follow-up Outpatient Visit Date: 08/15/2020  Primary Care Provider: Marinda Elk, [place] [address]  Chief Complaint: Follow-up SVT ablation and paroxysmal atrial fibrillation  HPI:  Julie Duncan is a 80 y.o. female with history of SVT status post ablation by Dr. Lovena Le earlier this month, atrial myxoma status post resection (06/5174 at [facility]) complicated by postoperative atrial fibrillation, hypertension, and osteoarthritis, who presents for urgent follow-up of abnormal monitor.  She underwent SVT ablation with Dr. Lovena Le on 07/30/2020.  She was discharged with an event monitor that showed possible episodes of Wenkebach and questionable complete heart block as well as paroxysmal atrial fibrillation with rapid ventricular response.  Today, Julie Duncan feels like her heart is frequently pounding, especially when she is sitting still or lying in bed.  She occasionally notes a dull ache in her chest that is different than what she felt leading up to her ED visit in December.  She has stable exertional dyspnea without shortness of breath at rest and orthopnea.  She has not had any edema.  She is a little bit sore in her right neck at the site of IJ access.  Her groin puncture site has healed completely.  She reports that she had been inadvertently taking losartan 50 mg twice daily until yesterday but is only taken it once today.  She is concerned about anticoagulation as she believes that warfarin may have caused her to have a poor appetite and feel very fatigued in the past.  --------------------------------------------------------------------------------------------------  Past Medical History:  Diagnosis Date  . Atrial myxoma   . Hyperlipidemia   . Hypertension   . Paroxysmal atrial fibrillation ([place] [facility]) 09/2014  . SVT (supraventricular tachycardia) (HCC)    Past Surgical History:  Procedure Laterality Date  . ANKLE SURGERY Left   . EXCISION OF  ATRIAL MYXOMA    . PARTIAL HYSTERECTOMY    . SVT ABLATION N/A 07/30/2020   Procedure: SVT ABLATION;  Surgeon: Evans Lance, MD;  Location: [facility];  Service: Cardiovascular;  Laterality: N/A;    Current Meds  Medication Sig  . ascorbic acid (VITAMIN C) 500 MG tablet Take 500 mg by mouth daily.  Marland Kitchen aspirin 81 MG EC tablet Take 81 mg by mouth daily.  . Cholecalciferol (VITAMIN D PO) Take by mouth daily.  Marland Kitchen losartan (COZAAR) 50 MG tablet Take 1 tablet (50 mg total) by mouth daily.  . Misc Natural Products (OSTEO BI-FLEX/5-LOXIN ADVANCED PO) Osteo Bi-Flex  . VITAMIN E PO Take by mouth daily.    Allergies: Red yeast rice  [cholestin] and Saccharin  Social History   Tobacco Use  . Smoking status: Never Smoker  . Smokeless tobacco: Never Used  Vaping Use  . Vaping Use: Never used  Substance Use Topics  . Alcohol use: No  . Drug use: No    Family History  Problem Relation Age of Onset  . Stroke Mother 63  . Heart attack Father 88  . Cancer Father        throat  . Cancer Sister 64       lung  . Cancer Brother        lung  . Heart attack Brother 70    Review of Systems: A 12-system review of systems was performed and was negative except as noted in the HPI.  --------------------------------------------------------------------------------------------------  Physical Exam: BP (!) 150/90 (BP Location: Left Arm, Patient Position: Sitting, Cuff Size: Large)   Pulse 88  Ht 5\' 7"  (1.702 m)   Wt 187 lb (84.8 kg)   SpO2 98%   BMI 29.29 kg/m   General:  NAD. Neck: No JVD or HJR. Lungs: Clear to auscultation bilaterally without wheezes or crackles. Heart: Irregular with occasional brief pauses.  No murmurs, rubs, or gallops. Abdomen: Soft, nontender, nondistended. Extremities: No lower extremity edema.  EKG: Sinus tachycardia with Mobitz type I second-degree AV block (ventricular rate 88 bpm) and left axis deviation.  Lab Results  Component Value Date   WBC  6.2 07/24/2020   HGB 13.1 07/24/2020   HCT 39.7 07/24/2020   MCV 89 07/24/2020   PLT 300 07/24/2020    Lab Results  Component Value Date   NA 142 07/24/2020   K 3.8 07/24/2020   CL 104 07/24/2020   CO2 32 (H) 07/24/2020   BUN 15 07/24/2020   CREATININE 0.85 07/24/2020   GLUCOSE 78 07/24/2020   ALT 18 04/14/2017    Lab Results  Component Value Date   CHOL 257 (H) 06/17/2010   HDL 46.60 06/17/2010   LDLCALC 179 01/21/2009   LDLCALC 179 01/21/2009   LDLCALC 179 01/21/2009   LDLDIRECT 183.0 06/17/2010   TRIG 185.0 (H) 06/17/2010   CHOLHDL 6 06/17/2010    --------------------------------------------------------------------------------------------------  ASSESSMENT AND PLAN: Paroxysmal atrial fibrillation, SVT, and Mobitz type I second-degree AV block: Incidentally noted on recent monitor following SVT ablation.  Patient notes some intermittent palpitations.  She is in sinus rhythm with Mobitz type I second-degree AV block today.  We have agreed to initiate apixaban 5 mg twice daily and arrange for her to be seen by Dr. Lovena Le in the next 1 to 2 weeks for further evaluation of her atrial fibrillation and underlying conduction disease.  AV nodal blocking agents will be avoided.  I advised Julie Duncan to call 911 where she to have syncope, near syncope, or profound fatigue, shortness of breath, or chest pain, as this could indicate atrial fibrillation with rapid ventricular response or worsening of her conduction disease  Hypertension: Blood pressure suboptimally controlled today.  I think it would be reasonable for her to go back to losartan 50 mg twice daily.  Chest pain: Transient and atypical.  ETT's in 2018 showed poor exercise capacity with no ischemic EKG changes.  Defer additional testing at this time given atypical nature of symptoms and risks/difficulties associated with performing pharmacologic myocardial perfusion stress testing or cardiac CTA in the setting of Mobitz type I  second-degree AV block.  Follow-up: Return to see Dr. Lovena Le in 1 to 2 weeks in EP clinic.  Follow-up with [place] in 1 month.  Nelva Bush, MD 08/15/2020 1:38 PM

## 2020-08-15 NOTE — Patient Instructions (Addendum)
Medication Instructions:  Your physician has recommended you make the following change in your medication:  1- STOP Aspirin. 2- START Eliquis 5 mg by mouth two times a day. 3- INCREASE Losartan to 50 mg by mouth two times a day.  *If you need a refill on your cardiac medications before your next appointment, please call your pharmacy*  Lab Work: none If you have labs (blood work) drawn today and your tests are completely normal, you will receive your results only by: Marland Kitchen MyChart Message (if you have MyChart) OR . A paper copy in the mail If you have any lab test that is abnormal or we need to change your treatment, we will call you to review the results.  Testing/Procedures: none  Follow-Up: At [facility], you and your health needs are our priority.  As part of our continuing mission to provide you with exceptional heart care, we have created designated Provider Care Teams.  These Care Teams include your primary Cardiologist (physician) and Advanced Practice Providers (APPs -  Physician Assistants and Nurse Practitioners) who all work together to provide you with the care you need, when you need it.  We recommend signing up for the patient portal called "MyChart".  Sign up information is provided on this After Visit Summary.  MyChart is used to connect with patients for Virtual Visits (Telemedicine).  Patients are able to view lab/test results, encounter notes, upcoming appointments, etc.  Non-urgent messages can be sent to your provider as well.   To learn more about what you can do with MyChart, go to [email or web address].    Your next appointment:   1-2 week(s)  The format for your next appointment:   In Person  Provider:   Dr Cristopher Peru    Your physician recommends that you schedule a follow-up appointment in: 1 month with Dr End or APP

## 2020-08-20 ENCOUNTER — Ambulatory Visit: Payer: PPO | Admitting: Student

## 2020-08-21 DIAGNOSIS — I480 Paroxysmal atrial fibrillation: Secondary | ICD-10-CM | POA: Diagnosis not present

## 2020-08-21 DIAGNOSIS — I10 Essential (primary) hypertension: Secondary | ICD-10-CM | POA: Diagnosis not present

## 2020-08-21 DIAGNOSIS — G6289 Other specified polyneuropathies: Secondary | ICD-10-CM | POA: Diagnosis not present

## 2020-08-21 DIAGNOSIS — E7849 Other hyperlipidemia: Secondary | ICD-10-CM | POA: Diagnosis not present

## 2020-08-21 DIAGNOSIS — Z1231 Encounter for screening mammogram for malignant neoplasm of breast: Secondary | ICD-10-CM | POA: Diagnosis not present

## 2020-08-21 DIAGNOSIS — M47818 Spondylosis without myelopathy or radiculopathy, sacral and sacrococcygeal region: Secondary | ICD-10-CM | POA: Diagnosis not present

## 2020-08-21 DIAGNOSIS — R7303 Prediabetes: Secondary | ICD-10-CM | POA: Diagnosis not present

## 2020-08-22 ENCOUNTER — Other Ambulatory Visit: Payer: Self-pay | Admitting: Internal Medicine

## 2020-08-23 MED ORDER — APIXABAN 5 MG PO TABS: 5.0000 mg | ORAL_TABLET | Freq: Two times a day (BID) | ORAL | 1 refills | Status: DC

## 2020-08-23 NOTE — Telephone Encounter (Signed)
Age 80, weight 85kg, SCr 0.85 on 07/24/20, afib indication, last visit 08/15/20. Pharmacy comment on refill is that pt is requesting generic. There is no generic formulation for Eliquis. Called pt to let her know but there was no answer. Will change refill to be for 90 day supply instead of 30 day supply for cost savings though. ($90 for 3 month supply vs $45 for 1 month supply).   Only generic anticoagulant is warfarin, but pt's office visit states "She is concerned about anticoagulation as she believes that warfarin may have caused her to have a poor appetite and feel very fatigued in the past."

## 2020-08-23 NOTE — Telephone Encounter (Signed)
Refill Request.  

## 2020-09-05 ENCOUNTER — Encounter: Payer: Self-pay | Admitting: Internal Medicine

## 2020-09-05 ENCOUNTER — Ambulatory Visit: Payer: PPO | Admitting: Internal Medicine

## 2020-09-05 ENCOUNTER — Other Ambulatory Visit: Payer: Self-pay

## 2020-09-05 VITALS — BP 144/64 | HR 93 | Ht 67.0 in | Wt 187.0 lb

## 2020-09-05 DIAGNOSIS — I441 Atrioventricular block, second degree: Secondary | ICD-10-CM

## 2020-09-05 DIAGNOSIS — I480 Paroxysmal atrial fibrillation: Secondary | ICD-10-CM

## 2020-09-05 DIAGNOSIS — I471 Supraventricular tachycardia: Secondary | ICD-10-CM

## 2020-09-05 NOTE — Progress Notes (Signed)
HPI Julie Duncan returns today for followup. She is a pleasant 80 yo woman with SVT who underwent EP study and catheter ablation. She has known conduction system disease with AVWB. She had atrial fib for an hour or two after her ablation of uncertain clinical significance and we had her wear a cardiac monitor and she was found to have spontaneous occurring atrial fib and was started by the atrial fib clinic on eliquis. She denies palpitations since her ablation. She has not had syncope or symptoms of symptomatic bradycardia despite having periods of 2:1 AV block as well as AVWB. She denies syncope, near syncope, or vision changes or dizziness.  Allergies  Allergen Reactions  . Red Yeast Rice  [Cholestin] Other (See Comments)  . Saccharin Other (See Comments)     Current Outpatient Medications  Medication Sig Dispense Refill  . apixaban (ELIQUIS) 5 MG TABS tablet Take 1 tablet (5 mg total) by mouth 2 (two) times daily. 180 tablet 1  . ascorbic acid (VITAMIN C) 500 MG tablet Take 500 mg by mouth daily.    . Cholecalciferol (VITAMIN D PO) Take by mouth daily.    Marland Kitchen losartan (COZAAR) 50 MG tablet Take 1 tablet (50 mg total) by mouth 2 (two) times daily. 60 tablet 3  . Misc Natural Products (OSTEO BI-FLEX/5-LOXIN ADVANCED PO) Osteo Bi-Flex    . VITAMIN E PO Take by mouth daily.     No current facility-administered medications for this visit.     Past Medical History:  Diagnosis Date  . Atrial myxoma   . Hyperlipidemia   . Hypertension   . Paroxysmal atrial fibrillation ([place]) 09/2014  . SVT (supraventricular tachycardia) (HCC)     ROS:   All systems reviewed and negative except as noted in the HPI.   Past Surgical History:  Procedure Laterality Date  . ANKLE SURGERY Left   . EXCISION OF ATRIAL MYXOMA    . PARTIAL HYSTERECTOMY    . SVT ABLATION N/A 07/30/2020   Procedure: SVT ABLATION;  Surgeon: Evans Lance, MD;  Location: [facility];  Service: Cardiovascular;   Laterality: N/A;     Family History  Problem Relation Age of Onset  . Stroke Mother 37  . Heart attack Father 42  . Cancer Father        throat  . Cancer Sister 92       lung  . Cancer Brother        lung  . Heart attack Brother 26     Social History   Socioeconomic History  . Marital status: Married    Spouse name: Not on file  . Number of children: Not on file  . Years of education: Not on file  . Highest education level: Not on file  Occupational History  . Not on file  Tobacco Use  . Smoking status: Never Smoker  . Smokeless tobacco: Never Used  Vaping Use  . Vaping Use: Never used  Substance and Sexual Activity  . Alcohol use: No  . Drug use: No  . Sexual activity: Not on file  Other Topics Concern  . Not on file  Social History Narrative  . Not on file   Social Determinants of Health   Financial Resource Strain: Not on file  Food Insecurity: Not on file  Transportation Needs: Not on file  Physical Activity: Not on file  Stress: Not on file  Social Connections: Not on file  Intimate Partner Violence:  Not on file     BP (!) 144/64   Pulse 93   Ht 5\' 7"  (1.702 m)   Wt 187 lb (84.8 kg)   SpO2 98%   BMI 29.29 kg/m   Physical Exam:  Well appearing NAD HEENT: Unremarkable Neck:  No JVD, no thyromegally Lymphatics:  No adenopathy Back:  No CVA tenderness Lungs:  Clear with no wheezes HEART:  Regular rate rhythm, no murmurs, no rubs, no clicks Abd:  soft, positive bowel sounds, no organomegally, no rebound, no guarding Ext:  2 plus pulses, no edema, no cyanosis, no clubbing Skin:  No rashes no nodules Neuro:  CN II through XII intact, motor grossly intact  EKG - NSR with AVWB  Assess/Plan: 1. SVT - she is asymptomatic, s/p Ablation. No change in meds. 2. HTN - her bp is fairly well controlled. She is encouraged to avoid salty foods. 3. PAF - she is asymptomatic, and she will continue eliquis. 4. AV block - as she is asymptomatic, I have  discussed the symptoms she might experience if her conduction worsens and if so she will call us.  Carleene Overlie Shayaan Parke,MD

## 2020-09-05 NOTE — Patient Instructions (Addendum)
Medication Instructions:  °Your physician recommends that you continue on your current medications as directed. Please refer to the Current Medication list given to you today. ° °Labwork: °None ordered. ° °Testing/Procedures: °None ordered. ° °Follow-Up: °Your physician wants you to follow-up in: 6 months with Gregg Taylor, MD  ° ° °Any Other Special Instructions Will Be Listed Below (If Applicable). ° °If you need a refill on your cardiac medications before your next appointment, please call your pharmacy.  ° ° ° ° °

## 2020-09-11 ENCOUNTER — Ambulatory Visit: Payer: PPO | Admitting: Physician Assistant

## 2020-09-20 ENCOUNTER — Telehealth: Payer: Self-pay

## 2020-09-20 MED ORDER — LOSARTAN POTASSIUM 50 MG PO TABS: 50.0000 mg | ORAL_TABLET | Freq: Two times a day (BID) | ORAL | 3 refills | Status: DC

## 2020-09-20 NOTE — Telephone Encounter (Signed)
The patient called with questions about medication. I told her I will have a nurse give her a call back.

## 2020-09-20 NOTE — Telephone Encounter (Signed)
Returned call to Pt.  Pt's losartan was changed to BID, but not enough medication sent to her pharmacy.  Reentered order for 3 month supply.  Pt thanked nurse for help.

## 2020-09-23 ENCOUNTER — Encounter: Payer: Self-pay | Admitting: Physician Assistant

## 2020-09-23 ENCOUNTER — Other Ambulatory Visit: Payer: Self-pay

## 2020-09-23 ENCOUNTER — Ambulatory Visit (INDEPENDENT_AMBULATORY_CARE_PROVIDER_SITE_OTHER): Payer: PPO | Admitting: Physician Assistant

## 2020-09-23 VITALS — BP 124/60 | HR 96 | Ht 67.0 in | Wt 182.0 lb

## 2020-09-23 DIAGNOSIS — I471 Supraventricular tachycardia: Secondary | ICD-10-CM

## 2020-09-23 DIAGNOSIS — I4430 Unspecified atrioventricular block: Secondary | ICD-10-CM

## 2020-09-23 DIAGNOSIS — I480 Paroxysmal atrial fibrillation: Secondary | ICD-10-CM

## 2020-09-23 DIAGNOSIS — I10 Essential (primary) hypertension: Secondary | ICD-10-CM | POA: Diagnosis not present

## 2020-09-23 DIAGNOSIS — I441 Atrioventricular block, second degree: Secondary | ICD-10-CM

## 2020-09-23 MED ORDER — RIVAROXABAN 20 MG PO TABS: 20.0000 mg | ORAL_TABLET | Freq: Every day | ORAL | 11 refills | Status: DC

## 2020-09-23 NOTE — Progress Notes (Signed)
Office Visit    Patient Name: Julie Duncan Date of Encounter: 09/23/2020  PCP:  Marinda Elk, [place]  Cardiologist:  Nelva Bush, MD Advanced Practice Provider:  No care team member to display Electrophysiologist:  None   Chief Complaint    Chief Complaint  Patient presents with  . Follow-up    1 Month follow up. Medication verbally reviewed with patient.     She has history of SVT s/p ablation 07/2020, PAF on Eliquis, atrial myxoma s/p resection 09/2014 at [facility] hypertension, AV block with Wenckebach (established with Dr. Alecia Lemming), osteoarthritis, and seen today for 1 month follow-up.  Past Medical History    Past Medical History:  Diagnosis Date  . Atrial myxoma   . Hyperlipidemia   . Hypertension   . Paroxysmal atrial fibrillation ([place]) 09/2014  . SVT (supraventricular tachycardia) (HCC)    Past Surgical History:  Procedure Laterality Date  . ANKLE SURGERY Left   . EXCISION OF ATRIAL MYXOMA    . PARTIAL HYSTERECTOMY    . SVT ABLATION N/A 07/30/2020   Procedure: SVT ABLATION;  Surgeon: Evans Lance, MD;  Location: [place] [facility];  Service: Cardiovascular;  Laterality: N/A;    Allergies  Allergies  Allergen Reactions  . Red Yeast Rice  [Cholestin] Other (See Comments)  . Saccharin Other (See Comments)    History of Present Illness    Julie Duncan is a 80 y.o. female with PMH as above.  She has history of SVT s/p ablation by Dr. Lovena Le 07/30/2020, atrial myxoma s/p resection (08/6142 at [facility]) complicated by postoperative atrial fibrillation, hypertension, osteoarthritis. she has history of SVT and underwent EP study and catheter ablation.  She has known conduction disease with AVWB.    She underwent SVT ablation 07/30/2020.  She had atrial fibrillation for an hour or 2 after her ablation of uncertain clinical significance.  She was found to have PAF and AV block, as well as possible AVWB (Wenckebach) versus complete  heart block.  She denies any syncope presyncope.She has denied palpitations since her ablation.    She was seen by her primary cardiologist 08/15/2020.  At that time, she felt as if her heart was frequently pounding, especially when sitting still or lying in bed.  She felt the occasional dull ache in her chest, which was different from what she felt leading up to her ED visit in December 2021.  She reported DOE without SOP at rest or orthopnea.  She felt sore on her right neck at the site of the IJ access.  She had inadvertently been taking losartan to 50 mg twice daily but dropped back down to once daily.  EKG showed NSR with Mobitz type I second-degree AV block.  She was started on Eliquis 5 mg twice daily and referred to Dr. Lovena Le.  It was noted that AV nodal blocking agents should be avoided.  Repeat stress testing was deferred given atypical nature of chest pain and risk/difficulties associated with performing a Lexiscan or cardiac CT in the setting of Mobitz type I second-degree AV block.  She was last seen by EP 09/05/2020.  Resulted 08/2020 cardiac monitoring, ordered by Dr. Lovena Le, as below and showed NSR with sinus tachycardia, periods of Mobitz I/II, possible PAF, criteria for CHB met.  She had a minimum heart rate of 38 bpm and maximum heart rate 136 bpm with average heart rate 73 bpm.  135 episodes of AV block/third-degree  AV block occurred, lasting a total of 38 minutes and 53 seconds.  Second-degree AV block/Mobitz 1 was present and corresponded with symptomatic patient events.  Rare PVCs/ventricular couplets.    At her following clinic visit with EP, it was noted that she was asymptomatic with her SVT and PAF s/p ablation.  She was continued on Eliquis for anticoagulation.  Her BP was fairly well controlled.  She was encouraged to avoid salty foods.  Symptoms were reviewed for her AV block and patient instructed to call the office if any symptoms progress.  Today, 09/23/2020, she returns to  clinic and notes significant fatigue.  She is uncertain if this fatigue is 2/2 running around all morning for errands.  She reports that she has been to the bank 3 times.  She is also been to the hardware store to replace all the parts in her push mower and weed eater.  She reports that she can sleep all night and still feel tired the next day.  When compared with her previous visit with her primary cardiologist, she feels as if her sleepiness/fatigue has increased.  No chest pain, racing heart rate, palpitations, presyncope, or syncope.  She reports bilateral hip pain, as well as bilateral foot pain. She has intermittent edema. She attributes some of her foot pain 2/2 left-sided ankle replacement, which she has been informed is deteriorating.  She reports left lower extremity skin sometimes more blue than right with pulses confirmed and checked with doppler.  She has pain in L foot and toes, as well as paresthesias.  On exam, discoloration/cyanosis noted with pulses confirmed by Doppler and recommendations as below.  She denies any alcohol or tobacco use.  She reports that she is not very active due to her pain and fatigue.  She no longer pushes her vacuum, and she has not recently mowed her yard since last summer.  She does report dancing the other day; however, she had to stop her foot pain.  She reports that she cannot take Eliquis due to the cost.  Samples of Xarelto provided, as well as prescription for Xarelto.  No reported signs or symptoms of bleeding.  EP follow-up sooner than scheduled discussed with recommendations as below.  Home Medications    Current Outpatient Medications on File Prior to Visit  Medication Sig Dispense Refill  . ascorbic acid (VITAMIN C) 500 MG tablet Take 500 mg by mouth daily.    . Cholecalciferol (VITAMIN D PO) Take by mouth daily.    . Coenzyme Q10 10 MG capsule Take by mouth.    . cyanocobalamin 50 MCG tablet Take by mouth.    . Ginkgo Biloba 40 MG TABS Take by mouth.     . losartan (COZAAR) 50 MG tablet Take 1 tablet (50 mg total) by mouth 2 (two) times daily. 180 tablet 3  . Misc Natural Products (OSTEO BI-FLEX/5-LOXIN ADVANCED PO) Osteo Bi-Flex    . VITAMIN E PO Take by mouth daily.     No current facility-administered medications on file prior to visit.    Review of Systems    She reports fatigue, bilateral hip pain, bilateral foot pain, intermittent bilateral lower extremity edema, left sided foot pain greater than that of right with associated cyanosis and pain/cramping in her toes. She denies chest pain, palpitations, dyspnea, pnd, orthopnea, n, v, dizziness, syncope, weight gain, or early satiety.   All other systems reviewed and are otherwise negative except as noted above.  Physical Exam    VS:  BP 124/60 (BP Location: Left Arm, Patient Position: Sitting, Cuff Size: Normal)   Pulse 96   Ht 5' 7"  (1.702 m)   Wt 182 lb (82.6 kg)   SpO2 96%   BMI 28.51 kg/m  , BMI Body mass index is 28.51 kg/m. GEN: Well nourished, well developed, in no acute distress. HEENT: normal. Neck: Supple, no JVD, carotid bruits, or masses. Cardiac: RRR, no murmurs, rubs, or gallops. No clubbing, cyanosis, edema.  Left lower extremity with cyanosis of toes.  Left lower extremity pedal pulse confirmed via Doppler.  Strong right lower extremity pedal pulse with palpation.  Radials/DP/PT 2+ and equal bilaterally.  Respiratory:  Respirations regular and unlabored, clear to auscultation bilaterally. GI: Soft, nontender, nondistended, BS + x 4. MS: no deformity or atrophy. Skin: warm and dry, no rash. Neuro:  Strength and sensation are intact. Psych: Normal affect.  Accessory Clinical Findings    ECG personally reviewed by me today - 96bpm, Mobitz I, QTC 439 - no acute changes.  VITALS Reviewed today   Temp Readings from Last 3 Encounters:  07/30/20 97.9 F (36.6 C) (Oral)  06/11/20 97.9 F (36.6 C) (Oral)  04/14/17 98.7 F (37.1 C) (Oral)   BP Readings from  Last 3 Encounters:  09/23/20 124/60  09/05/20 (!) 144/64  08/15/20 (!) 150/90   Pulse Readings from Last 3 Encounters:  09/23/20 96  09/05/20 93  08/15/20 88    Wt Readings from Last 3 Encounters:  09/23/20 182 lb (82.6 kg)  09/05/20 187 lb (84.8 kg)  08/15/20 187 lb (84.8 kg)     LABS  reviewed today   Lab Results  Component Value Date   WBC 6.2 07/24/2020   HGB 13.1 07/24/2020   HCT 39.7 07/24/2020   MCV 89 07/24/2020   PLT 300 07/24/2020   Lab Results  Component Value Date   CREATININE 0.85 07/24/2020   BUN 15 07/24/2020   NA 142 07/24/2020   K 3.8 07/24/2020   CL 104 07/24/2020   CO2 32 (H) 07/24/2020   Lab Results  Component Value Date   ALT 18 04/14/2017   AST 21 04/14/2017   ALKPHOS 85 04/14/2017   BILITOT 0.5 04/14/2017   Lab Results  Component Value Date   CHOL 257 (H) 06/17/2010   HDL 46.60 06/17/2010   LDLCALC 179 01/21/2009   LDLCALC 179 01/21/2009   LDLCALC 179 01/21/2009   LDLDIRECT 183.0 06/17/2010   TRIG 185.0 (H) 06/17/2010   CHOLHDL 6 06/17/2010    No results found for: HGBA1C Lab Results  Component Value Date   TSH 1.048 06/11/2020     STUDIES/PROCEDURES reviewed today   Echo ordered, pending  08/26/20 Cardiac monitoring, Dr. Lovena Le 1. NSR with sinus tachycardia 2. Periods of mobitz 1 and mobitz 2, second degree AVB 3. Cannot exclude PAF (strips are irregular and fib waves are suggestive but not conclusive) 4. No VT or SVT Patch Wear Time:  5 days and 15 hours (2022-02-15T18:29:49-0500 to 2022-02-21T10:13:03-499) Patient had a min HR of 38 bpm, max HR of 136 bpm, and avg HR of 73 bpm. 135 episode(s) of AV Block (3rd) occurred, lasting a total of 38 mins 53 secs. Second Degree AV Block-Mobitz I (Wenckebach) was present. Wenckebach was detected within +/- 45  seconds of symptomatic patient event(s). No Isolated SVEs, SVE Couplets, or SVE Triplets were present. Isolated VEs were rare (<1.0%), VE Couplets were rare (<1.0%), and no  VE Triplets were present. Previously notified - MD notification criteria  for  Complete Heart Block met - notified Kandace Parkins RN on 08/08/2020 at 12:42 PM CST Shelva Majestic)  Cardiac monitoring 07/05/2020  The patient was monitored for 14 days.  The predominant rhythm was sinus with an average rate of 82 bpm (range 33-103 bpm in sinus). Episodes of Mobitz type 1 second degree AV block were observed.  There were rare PAC's and PVC's. A singe 4 beat run of nonsustained ventricular tachycardia was observed with a maximum rate of 120 bpm.  There were 3813 episodes of supraventricular tachycardia lasting up to 44 minuted, 47 seconds. The maximum heart rate was 231 bpm.  Patient triggered events correpond primarily to supraventricular tachycardia. Sinus rhythm with frequent SVT, some of which is symptomatic.  Rare PAC's, PVC's, and NSVT noted, as well as episodes of Mobitz type 1 second degree AV block.  ETT 04/2017  Baseline EKG shows narrow complex tachycardia with poor R-wave progressions and non-specific ST segment changes.  Exaggerated heart rate response is noted with stress. There is sudden conversion to sinus tachycardia with coughing during early recovery.  No diagnostic ST-segment or T-wave changes are noted during stress or recovery.  Intermediate risk exercise tolerance test (Duke Treadmill Score = 3). Poor exercise capacity without diagnostic EKG changes of ischemia. Baseline narrow-complex tachycardia with abrupt cessation with coughing during early recovery suggestive of supraventricular tachycardia.    Assessment & Plan    Paroxysmal atrial fibrillation, SVT, Mobitz I/II, CHB --Reports fatigue that is worse when compared with previous 08/15/2020 office visit.  No further report of palpitations in the setting of known PAF with ablation 07/2020.  No further chest pain as previously reported with 2018 ETT showing poor exercise capacity without EKG ischemic changes.  Review of EP note  with recommendation to monitor symptoms.  EKG today shows Mobitz I; however, review of most recent cardiac monitoring (read 3/13) shows episodes of complete heart block per the report and EP notified.  Subsequent EP visit 3/17 with recommendation for 6 mo RTC; however, given her worsening fatigue, I am concerned for worsening conduction disease / SSS.  Carefully reviewed return precautions and recommendations for ED presentation. Recommend follow-up with the EP as soon as her schedule allows.  Will also loop in primary cardiologist. She expresses a desire to follow-up with the EP at the [facility] location and will be set up with Dr. Quentin Ore, given she does not like to drive in [place] and frequently has to have her son-in-law drive her for that reason.  Advised her to call 911 if presyncope, syncope, chest pain, or shortness of breath.  Avoid AV nodal blocking agents.  Continue apixaban 5 mg twice daily.  No signs or symptoms of bleeding.  HTN --BP well controlled.  Continue current losartan twice daily.  Salt and fluid guidelines reviewed.  Chest pain, resolved --Previously reported transient atypical chest pain without recurrence.  ETT in 2018 with poor exercise capacity and no ischemic changes.  Defer additional testing at this time given atypical nature of her symptoms in the past and lack of recurrence, as well as difficulties with performing pharmacologic MPI or cardiac CTA in the setting of Mobitz type I second-degree AV block.  Bilateral lower extremity edema/pain --Reports intermittent edema and bilateral lower extremity pain.  She has known left ankle replacement, which she feels may be contributing to her left lower extremity pain.  On exam, cyanosis of toes noted.  Pedal pulses confirmed for bilateral lower extremities.  Strong right pedal pulse felt on exam  with left pedal pulse reduced but confirmed on Doppler.  Discussed obtaining arterial studies in the future.  Deferred for now.   Recommend cholesterol control and control of risk factors.  Disposition: RTC after EP visit. Will also send note to Dr. Saunders Revel to loop in regarding cardiac monitoring.    Arvil Chaco, PA-C 09/23/2020

## 2020-09-23 NOTE — Patient Instructions (Signed)
Medication Instructions:  Your physician has recommended you make the following change in your medication:   START Xarelto 20mg  DAILY  *If you need a refill on your cardiac medications before your next appointment, please call your pharmacy*   Lab Work: None ordered   Testing/Procedures: None ordered   Follow-Up: At [facility], you and your health needs are our priority.  As part of our continuing mission to provide you with exceptional heart care, we have created designated Provider Care Teams.  These Care Teams include your primary Cardiologist (physician) and Advanced Practice Providers (APPs -  Physician Assistants and Nurse Practitioners) who all work together to provide you with the care you need, when you need it.  We recommend signing up for the patient portal called "MyChart".  Sign up information is provided on this After Visit Summary.  MyChart is used to connect with patients for Virtual Visits (Telemedicine).  Patients are able to view lab/test results, encounter notes, upcoming appointments, etc.  Non-urgent messages can be sent to your provider as well.   To learn more about what you can do with MyChart, go to [email or web address].    Your next appointment:    Follow up with Dr. Quentin Ore (when available)  Follow up with Marrianne Mood, PA or Dr. Saunders Revel - 6 months  The format for your next appointment:   In Person  Provider:    You may see Nelva Bush, MD or one of the following Advanced Practice Providers on your [facility] Team   [place], [place]

## 2020-10-21 DIAGNOSIS — R7303 Prediabetes: Secondary | ICD-10-CM | POA: Diagnosis not present

## 2020-10-21 DIAGNOSIS — I10 Essential (primary) hypertension: Secondary | ICD-10-CM | POA: Diagnosis not present

## 2020-10-21 DIAGNOSIS — E7849 Other hyperlipidemia: Secondary | ICD-10-CM | POA: Diagnosis not present

## 2020-10-28 DIAGNOSIS — R200 Anesthesia of skin: Secondary | ICD-10-CM | POA: Diagnosis not present

## 2020-10-28 DIAGNOSIS — R202 Paresthesia of skin: Secondary | ICD-10-CM | POA: Diagnosis not present

## 2020-10-28 DIAGNOSIS — Z86018 Personal history of other benign neoplasm: Secondary | ICD-10-CM | POA: Diagnosis not present

## 2020-10-28 DIAGNOSIS — I480 Paroxysmal atrial fibrillation: Secondary | ICD-10-CM | POA: Diagnosis not present

## 2020-10-28 DIAGNOSIS — Z0000 Encounter for general adult medical examination without abnormal findings: Secondary | ICD-10-CM | POA: Diagnosis not present

## 2020-10-28 DIAGNOSIS — E7849 Other hyperlipidemia: Secondary | ICD-10-CM | POA: Diagnosis not present

## 2020-10-28 DIAGNOSIS — M1712 Unilateral primary osteoarthritis, left knee: Secondary | ICD-10-CM | POA: Diagnosis not present

## 2020-10-28 DIAGNOSIS — R7303 Prediabetes: Secondary | ICD-10-CM | POA: Diagnosis not present

## 2020-10-28 DIAGNOSIS — M19079 Primary osteoarthritis, unspecified ankle and foot: Secondary | ICD-10-CM | POA: Diagnosis not present

## 2020-10-28 DIAGNOSIS — I10 Essential (primary) hypertension: Secondary | ICD-10-CM | POA: Diagnosis not present

## 2020-12-04 DIAGNOSIS — M25571 Pain in right ankle and joints of right foot: Secondary | ICD-10-CM | POA: Diagnosis not present

## 2020-12-04 DIAGNOSIS — M79672 Pain in left foot: Secondary | ICD-10-CM | POA: Diagnosis not present

## 2020-12-04 DIAGNOSIS — M25572 Pain in left ankle and joints of left foot: Secondary | ICD-10-CM | POA: Diagnosis not present

## 2020-12-04 DIAGNOSIS — G8929 Other chronic pain: Secondary | ICD-10-CM | POA: Diagnosis not present

## 2020-12-04 DIAGNOSIS — M19071 Primary osteoarthritis, right ankle and foot: Secondary | ICD-10-CM | POA: Diagnosis not present

## 2020-12-04 DIAGNOSIS — M79671 Pain in right foot: Secondary | ICD-10-CM | POA: Diagnosis not present

## 2020-12-04 DIAGNOSIS — M2012 Hallux valgus (acquired), left foot: Secondary | ICD-10-CM | POA: Diagnosis not present

## 2020-12-04 DIAGNOSIS — M19072 Primary osteoarthritis, left ankle and foot: Secondary | ICD-10-CM | POA: Diagnosis not present

## 2020-12-19 ENCOUNTER — Other Ambulatory Visit: Payer: Self-pay

## 2020-12-25 DIAGNOSIS — M79672 Pain in left foot: Secondary | ICD-10-CM | POA: Diagnosis not present

## 2020-12-25 DIAGNOSIS — M19079 Primary osteoarthritis, unspecified ankle and foot: Secondary | ICD-10-CM | POA: Diagnosis not present

## 2020-12-25 DIAGNOSIS — M79671 Pain in right foot: Secondary | ICD-10-CM | POA: Diagnosis not present

## 2020-12-25 DIAGNOSIS — Z9889 Other specified postprocedural states: Secondary | ICD-10-CM | POA: Diagnosis not present

## 2021-02-05 ENCOUNTER — Ambulatory Visit (INDEPENDENT_AMBULATORY_CARE_PROVIDER_SITE_OTHER): Payer: PPO | Admitting: Cardiology

## 2021-02-05 ENCOUNTER — Other Ambulatory Visit: Payer: Self-pay

## 2021-02-05 ENCOUNTER — Encounter: Payer: Self-pay | Admitting: Cardiology

## 2021-02-05 VITALS — BP 150/88 | HR 79 | Ht 67.0 in | Wt 185.2 lb

## 2021-02-05 DIAGNOSIS — I471 Supraventricular tachycardia: Secondary | ICD-10-CM

## 2021-02-05 DIAGNOSIS — I441 Atrioventricular block, second degree: Secondary | ICD-10-CM | POA: Diagnosis not present

## 2021-02-05 DIAGNOSIS — I4891 Unspecified atrial fibrillation: Secondary | ICD-10-CM | POA: Diagnosis not present

## 2021-02-05 NOTE — Progress Notes (Signed)
Electrophysiology Office Follow up Visit Note:    Date:  02/05/2021   ID:  Julie Duncan, DOB 07-Sep-1940, MRN [id number]  PCP:  Marinda Elk, MD  [facility] Cardiologist:  Nelva Bush, MD     Interval History:    Julie Duncan is a 80 y.o. female who presents for a follow up visit.  She underwent a successful slow pathway modification on July 30, 2020 by Dr. Lovena Le.  She is not had a recurrence of AVNRT since the ablation procedure.  She does have known AV conduction disease with AV Wenke block.  Today she tells me she is had no lightheadedness or dizziness since the procedure.  No syncope.  No chest pain.  She is active without limitation.     Past Medical History:  Diagnosis Date   Atrial myxoma    Hyperlipidemia    Hypertension    Paroxysmal atrial fibrillation ([place]) 09/2014   SVT (supraventricular tachycardia) (HCC)     Past Surgical History:  Procedure Laterality Date   ANKLE SURGERY Left    EXCISION OF ATRIAL MYXOMA     PARTIAL HYSTERECTOMY     SVT ABLATION N/A 07/30/2020   Procedure: SVT ABLATION;  Surgeon: Evans Lance, MD;  Location: [place] [facility];  Service: Cardiovascular;  Laterality: N/A;    Current Medications: Current Meds  Medication Sig   ascorbic acid (VITAMIN C) 500 MG tablet Take 500 mg by mouth daily.   Cholecalciferol (VITAMIN D PO) Take by mouth daily.   Coenzyme Q10 10 MG capsule Take by mouth.   cyanocobalamin 50 MCG tablet Take by mouth.   Ginkgo Biloba 40 MG TABS Take by mouth.   Misc Natural Products (OSTEO BI-FLEX/5-LOXIN ADVANCED PO) Osteo Bi-Flex   rivaroxaban (XARELTO) 20 MG TABS tablet Take 1 tablet (20 mg total) by mouth daily with supper.   rosuvastatin (CRESTOR) 10 MG tablet Take 10 mg by mouth daily.   VITAMIN E PO Take by mouth daily.     Allergies:   Red yeast rice  [cholestin] and Saccharin   Social History   Socioeconomic History   Marital status: Married    Spouse name: Not on file   Number of  children: Not on file   Years of education: Not on file   Highest education level: Not on file  Occupational History   Not on file  Tobacco Use   Smoking status: Never   Smokeless tobacco: Never  Vaping Use   Vaping Use: Never used  Substance and Sexual Activity   Alcohol use: No   Drug use: No   Sexual activity: Not on file  Other Topics Concern   Not on file  Social History Narrative   Not on file   Social Determinants of Health   Financial Resource Strain: Not on file  Food Insecurity: Not on file  Transportation Needs: Not on file  Physical Activity: Not on file  Stress: Not on file  Social Connections: Not on file     Family History: The patient's family history includes Cancer in her brother and father; Cancer (age of onset: 66) in her sister; Heart attack (age of onset: 21) in her brother; Heart attack (age of onset: 80) in her father; Stroke (age of onset: 50) in her mother.  ROS:   Please see the history of present illness.    All other systems reviewed and are negative.  EKGs/Labs/Other Studies Reviewed:    The following studies were reviewed today:  Prior notes, EP study records  EKG:  The ekg ordered today demonstrates sinus rhythm with second-degree AV block Mobitz 1  Recent Labs: 06/11/2020: B Natriuretic Peptide 201.6; Magnesium 2.1; TSH 1.048 07/24/2020: BUN 15; Creatinine, Ser 0.85; Hemoglobin 13.1; Platelets 300; Potassium 3.8; Sodium 142  Recent Lipid Panel    Component Value Date/Time   CHOL 257 (H) 06/17/2010 1010   TRIG 185.0 (H) 06/17/2010 1010   HDL 46.60 06/17/2010 1010   CHOLHDL 6 06/17/2010 1010   VLDL 37.0 06/17/2010 1010   LDLCALC 179 01/21/2009 0000   LDLCALC 179 01/21/2009 0000   LDLCALC 179 01/21/2009 0000   LDLDIRECT 183.0 06/17/2010 1010    Physical Exam:    VS:  BP (!) 150/88   Pulse 79   Ht '5\' 7"'$  (1.702 m)   Wt 185 lb 3.2 oz (84 kg)   SpO2 97%   BMI 29.01 kg/m     Wt Readings from Last 3 Encounters:  02/05/21 185  lb 3.2 oz (84 kg)  09/23/20 182 lb (82.6 kg)  09/05/20 187 lb (84.8 kg)     GEN:  Well nourished, well developed in no acute distress HEENT: Normal NECK: No JVD; No carotid bruits LYMPHATICS: No lymphadenopathy CARDIAC: Irregular rhythm, no murmurs, rubs, gallops RESPIRATORY:  Clear to auscultation without rales, wheezing or rhonchi  ABDOMEN: Soft, non-tender, non-distended MUSCULOSKELETAL:  No edema; No deformity  SKIN: Warm and dry.  Skin lesions on lower legs consistent with cat scratches NEUROLOGIC:  Alert and oriented x 3 PSYCHIATRIC:  Normal affect   ASSESSMENT:    1. SVT (supraventricular tachycardia) ([place])   2. Atrial fibrillation, unspecified type ([place])   3. Mobitz type 1 second degree atrioventricular block    PLAN:    In order of problems listed above:   1. SVT (supraventricular tachycardia) ([place]) Patient with a history of AVNRT and is now post slow pathway ablation in February 2022 by Dr. Lovena Le.  She has not had a recurrence.  2. Atrial fibrillation, unspecified type ([place]) History of paroxysmal atrial fibrillation.  She is in normal rhythm today.  She declines anticoagulation.  She says it is too expensive and she is taking aspirin only.  She understands the risks.  3. Mobitz type 1 second degree atrioventricular block Chronic issue for her.  Asymptomatic.   Follow-up as needed.   Medication Adjustments/Labs and Tests Ordered: Current medicines are reviewed at length with the patient today.  Concerns regarding medicines are outlined above.  No orders of the defined types were placed in this encounter.  No orders of the defined types were placed in this encounter.    Signed, Lars Mage, MD, [facility], [facility] 02/05/2021 3:40 PM    Electrophysiology [facility]

## 2021-02-05 NOTE — Patient Instructions (Addendum)

## 2021-02-13 ENCOUNTER — Ambulatory Visit: Payer: PPO | Admitting: Internal Medicine

## 2021-02-14 DIAGNOSIS — M5442 Lumbago with sciatica, left side: Secondary | ICD-10-CM | POA: Diagnosis not present

## 2021-02-26 ENCOUNTER — Ambulatory Visit: Payer: PPO | Admitting: Internal Medicine

## 2021-02-26 NOTE — Progress Notes (Deleted)
Follow-up Outpatient Visit Date: 02/26/2021  Primary Care Provider: Marinda Elk, [place] [address]  Chief Complaint: ***  HPI:  Ms. Corts is a 80 y.o. female with history of SVT status post ablation by Dr. Lovena Le (07/2020), atrial myxoma status post resection ([id number] at [facility]) complicated by postoperative atrial fibrillation, hypertension, and osteoarthritis, who presents for follow-up of SVT and heart block.  Preceding event monitor showed episodes of Mobitz type I second-degree AV block and questionable complete heart block as well as paroxysmal atrial fibrillation with rapid ventricular response.  Ms. Cernosek complained of frequent pounding in her chest as well as an occasional dull ache in her chest.  She was started on apixaban and referred for follow-up with Dr. Lovena Le.  He did not make any medication changes or pursue additional testing.  She complained of significant fatigue at her most recent follow-up visit with [place] in April.  She was switched from apixaban to rivaroxaban at that time, though reason for switch is unclear.  She saw Dr. Quentin Ore last month and reported no symptoms.  Of note, she had stopped taking rivaroxaban due to cost and was on aspirin alone.  --------------------------------------------------------------------------------------------------  Past Medical History:  Diagnosis Date   Atrial myxoma    Hyperlipidemia    Hypertension    Paroxysmal atrial fibrillation (HCC) 09/2014   SVT (supraventricular tachycardia) (HCC)    Past Surgical History:  Procedure Laterality Date   ANKLE SURGERY Left    EXCISION OF ATRIAL MYXOMA     PARTIAL HYSTERECTOMY     SVT ABLATION N/A 07/30/2020   Procedure: SVT ABLATION;  Surgeon: Evans Lance, MD;  Location: [place] [facility];  Service: Cardiovascular;  Laterality: N/A;    No outpatient medications have been marked as taking for the 02/26/21 encounter (Appointment) with Glennis Montenegro,  Harrell Gave, MD.    Allergies: Red yeast rice  [cholestin] and Saccharin  Social History   Tobacco Use   Smoking status: Never   Smokeless tobacco: Never  Vaping Use   Vaping Use: Never used  Substance Use Topics   Alcohol use: No   Drug use: No    Family History  Problem Relation Age of Onset   Stroke Mother 35   Heart attack Father 50   Cancer Father        throat   Cancer Sister 53       lung   Cancer Brother        lung   Heart attack Brother 66    Review of Systems: A 12-system review of systems was performed and was negative except as noted in the HPI.  --------------------------------------------------------------------------------------------------  Physical Exam: There were no vitals taken for this visit.  General:  NAD. Neck: No JVD or HJR. Lungs: Clear to auscultation bilaterally without wheezes or crackles. Heart: Regular rate and rhythm without murmurs, rubs, or gallops. Abdomen: Soft, nontender, nondistended. Extremities: No lower extremity edema.  EKG:  ***  Lab Results  Component Value Date   WBC 6.2 07/24/2020   HGB 13.1 07/24/2020   HCT 39.7 07/24/2020   MCV 89 07/24/2020   PLT 300 07/24/2020    Lab Results  Component Value Date   NA 142 07/24/2020   K 3.8 07/24/2020   CL 104 07/24/2020   CO2 32 (H) 07/24/2020   BUN 15 07/24/2020   CREATININE 0.85 07/24/2020   GLUCOSE 78 07/24/2020   ALT 18 04/14/2017    Lab Results  Component Value Date   CHOL 257 (H) 06/17/2010   HDL 46.60 06/17/2010   LDLCALC 179 01/21/2009   LDLCALC 179 01/21/2009   LDLCALC 179 01/21/2009   LDLDIRECT 183.0 06/17/2010   TRIG 185.0 (H) 06/17/2010   CHOLHDL 6 06/17/2010    --------------------------------------------------------------------------------------------------  ASSESSMENT AND PLAN: Nelva Bush, MD 02/26/2021 6:43 AM

## 2021-04-30 DIAGNOSIS — M79672 Pain in left foot: Secondary | ICD-10-CM | POA: Diagnosis not present

## 2021-04-30 DIAGNOSIS — Z86018 Personal history of other benign neoplasm: Secondary | ICD-10-CM | POA: Diagnosis not present

## 2021-04-30 DIAGNOSIS — M79671 Pain in right foot: Secondary | ICD-10-CM | POA: Diagnosis not present

## 2021-04-30 DIAGNOSIS — E7849 Other hyperlipidemia: Secondary | ICD-10-CM | POA: Diagnosis not present

## 2021-04-30 DIAGNOSIS — R7303 Prediabetes: Secondary | ICD-10-CM | POA: Diagnosis not present

## 2021-04-30 DIAGNOSIS — I480 Paroxysmal atrial fibrillation: Secondary | ICD-10-CM | POA: Diagnosis not present

## 2021-04-30 DIAGNOSIS — I10 Essential (primary) hypertension: Secondary | ICD-10-CM | POA: Diagnosis not present

## 2021-04-30 DIAGNOSIS — M19079 Primary osteoarthritis, unspecified ankle and foot: Secondary | ICD-10-CM | POA: Diagnosis not present

## 2021-05-02 ENCOUNTER — Other Ambulatory Visit: Payer: Self-pay | Admitting: Physician Assistant

## 2021-05-02 DIAGNOSIS — Z1231 Encounter for screening mammogram for malignant neoplasm of breast: Secondary | ICD-10-CM

## 2021-07-11 DIAGNOSIS — M19072 Primary osteoarthritis, left ankle and foot: Secondary | ICD-10-CM | POA: Diagnosis not present

## 2021-07-21 DIAGNOSIS — M19071 Primary osteoarthritis, right ankle and foot: Secondary | ICD-10-CM | POA: Diagnosis not present

## 2021-07-21 DIAGNOSIS — M65872 Other synovitis and tenosynovitis, left ankle and foot: Secondary | ICD-10-CM | POA: Diagnosis not present

## 2021-07-29 ENCOUNTER — Encounter: Payer: Self-pay | Admitting: Nurse Practitioner

## 2021-07-29 ENCOUNTER — Ambulatory Visit (INDEPENDENT_AMBULATORY_CARE_PROVIDER_SITE_OTHER): Payer: PPO | Admitting: Nurse Practitioner

## 2021-07-29 ENCOUNTER — Other Ambulatory Visit: Payer: Self-pay

## 2021-07-29 VITALS — BP 128/68 | HR 76 | Ht 67.0 in | Wt 180.0 lb

## 2021-07-29 DIAGNOSIS — I4892 Unspecified atrial flutter: Secondary | ICD-10-CM

## 2021-07-29 DIAGNOSIS — I480 Paroxysmal atrial fibrillation: Secondary | ICD-10-CM

## 2021-07-29 DIAGNOSIS — I471 Supraventricular tachycardia: Secondary | ICD-10-CM

## 2021-07-29 DIAGNOSIS — I10 Essential (primary) hypertension: Secondary | ICD-10-CM | POA: Diagnosis not present

## 2021-07-29 DIAGNOSIS — I441 Atrioventricular block, second degree: Secondary | ICD-10-CM | POA: Diagnosis not present

## 2021-07-29 DIAGNOSIS — I483 Typical atrial flutter: Secondary | ICD-10-CM

## 2021-07-29 DIAGNOSIS — R002 Palpitations: Secondary | ICD-10-CM | POA: Diagnosis not present

## 2021-07-29 DIAGNOSIS — E782 Mixed hyperlipidemia: Secondary | ICD-10-CM | POA: Diagnosis not present

## 2021-07-29 DIAGNOSIS — I4891 Unspecified atrial fibrillation: Secondary | ICD-10-CM | POA: Diagnosis not present

## 2021-07-29 MED ORDER — APIXABAN 5 MG PO TABS: 5.0000 mg | ORAL_TABLET | Freq: Two times a day (BID) | ORAL | 3 refills | Status: DC

## 2021-07-29 NOTE — Patient Instructions (Addendum)
Medication Instructions:  Your physician has recommended you make the following change in your medication:  1.Stop Aspirin 81 mg 2.Start Eliquis 5 mg, take one tablet by mouth twice a day *If you need a refill on your cardiac medications before your next appointment, please call your pharmacy*   Lab Work: CBC, BMET, MAG and TSH today If you have labs (blood work) drawn today and your tests are completely normal, you will receive your results only by: [place] (if you have MyChart) OR A paper copy in the mail If you have any lab test that is abnormal or we need to change your treatment, we will call you to review the results.   Testing/Procedures: Your physician has requested that you have an echocardiogram. Echocardiography is a painless test that uses sound waves to create images of your heart. It provides your doctor with information about the size and shape of your heart and how well your heart's chambers and valves are working. This procedure takes approximately one hour. There are no restrictions for this procedure.    Follow-Up: At [facility], you and your health needs are our priority.  As part of our continuing mission to provide you with exceptional heart care, we have created designated Provider Care Teams.  These Care Teams include your primary Cardiologist (physician) and Advanced Practice Providers (APPs -  Physician Assistants and Nurse Practitioners) who all work together to provide you with the care you need, when you need it.  We recommend signing up for the patient portal called "MyChart".  Sign up information is provided on this After Visit Summary.  MyChart is used to connect with patients for Virtual Visits (Telemedicine).  Patients are able to view lab/test results, encounter notes, upcoming appointments, etc.  Non-urgent messages can be sent to your provider as well.   To learn more about what you can do with MyChart, go to [email or web address].    Your  next appointment:   2-3 week(s)  The format for your next appointment:   In Person  Provider:   Nelva Bush, MD, Lars Mage, MD, or Murray Hodgkins, NP{

## 2021-07-29 NOTE — Progress Notes (Signed)
Office Visit    Patient Name: Julie Duncan Date of Encounter: 07/29/2021  Primary Care Provider:  Marinda Elk, MD Primary Cardiologist:  Nelva Bush, MD  Chief Complaint    81 year old female with a history of atrial myxoma status postresection at [facility] in April 2016, PAF on Xarelto, PSVT/AVNRT status post RFCA in 07/2020, Mobitz 1 heart block, hypertension, hyperlipidemia, and arthritis, who presents for follow-up related to fatigue and new finding of atrial flutter.  Past Medical History    Past Medical History:  Diagnosis Date   Atrial flutter ([place])    Atrial myxoma    a. 2016 s/p resection ([facility]); b. 04/2017 Echo: EF 60-65%, no rwma, mild AI/MR. Mild-mod TR. Nl PASP.   History of stress test    a. 04/2017 ETT: Poor ex capacity. No ischemia. Episode of PSVT noted.   Hyperlipidemia    Hypertension    Mobitz I    Mobitz II    Paroxysmal atrial fibrillation (HCC)    a. CHA2DS2VASc = 4.  Did not tolerate Warfarin. Difficulty in affording DOAC. Eliquis added 07/2021 (new Aflutter).   Paroxysmal SVT (supraventricular tachycardia) ([place])    a. 06/2020 Zio: 3813 SVT runs - longest 1m 47s, max rate 231; b. 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   Past Surgical History:  Procedure Laterality Date   ANKLE SURGERY Left    EXCISION OF ATRIAL MYXOMA     PARTIAL HYSTERECTOMY     SVT ABLATION N/A 07/30/2020   Procedure: SVT ABLATION;  Surgeon: Evans Lance, MD;  Location: [place] [facility];  Service: Cardiovascular;  Laterality: N/A;    Allergies  Allergies  Allergen Reactions   Red Yeast Rice  [Cholestin] Other (See Comments)   Saccharin Other (See Comments)   Warfarin And Related     anorexia    History of Present Illness    81 year old female with above past medical history including atrial myxoma, paroxysmal atrial fibrillation, PSVT/AVNRT, Mobitz 1 heart block, hypertension, hyperlipidemia, and arthritis.  She previously underwent resection of atrial myxoma at [facility]  in April 2016.  Following resection, she noted worsening of palpitations and fatigue, which was initially managed with atenolol therapy, which was initially switched to diltiazem (caused headaches) and subsequently metoprolol.  She was on warfarin for a period of time in the setting paroxysmal atrial fibrillation following myxoma resection.  She establish care in our office in November 2017.  She underwent exercise treadmill testing in November 2018 which was notable for poor exercise tolerance, an episode of PSVT, and no evidence of ischemia.  Most recent echo in November 2018 showed an EF of 60 to 65% without regional wall motion abnormalities, mild AI/MR, and mild to moderate TR.  In early 2022, in the setting of worsening palpitations, she wore an event monitor which showed 3813 runs of SVT, the longest lasting 44 minutes and 47 seconds.  Mobitz 1 AV block was also noted.  She subsequently underwent successful catheter ablation for AVNRT in February 2022.  An event monitor was placed following the procedure and was notable for 135 episodes of AV block including Mobitz 1 and Mobitz 2.  Wenckebach was detected within +/- 45 seconds of symptomatic events.  Episodes of paroxysmal atrial fibrillation could not be excluded.  She was placed on Eliquis but later discontinued due to cost and has been taking aspirin daily.    She was last seen by Dr. Quentin Ore in August 2022, at which time she was  doing well.  In the setting of lack of symptoms/recurrent tachycardias, and history of Mobitz 1/2, AV nodal blocking agents have been avoided.  Unfortunately, last Monday, in the setting of ongoing bilateral ankle pain, she had a cortisone injection in her right ankle.  She notes that the following day, she began noticing significant fatigue, low energy, and dyspnea on exertion.  She also felt anxious at times and noted that while lying down, she felt as though her heart was beating hard, though not necessarily fast.  Her heart  rates on her home blood pressure cuff have been in the 70s and blood pressures have been variable, sometimes into the 150's.  She thought perhaps she felt poorly because of elevated blood pressures and scheduled this appointment today.  On ECG today, she is in atrial flutter with 4 1 conduction and a heart rate of 76.  She denies chest pain, PND, orthopnea, dizziness, syncope, or edema.  Her appetite has been poor over the past week.  Home Medications    Current Outpatient Medications  Medication Sig Dispense Refill   apixaban (ELIQUIS) 5 MG TABS tablet Take 1 tablet (5 mg total) by mouth 2 (two) times daily. 60 tablet 3   ascorbic acid (VITAMIN C) 500 MG tablet Take 500 mg by mouth daily.     Cholecalciferol (VITAMIN D PO) Take by mouth daily.     Coenzyme Q10 10 MG capsule Take by mouth.     cyanocobalamin 50 MCG tablet Take by mouth.     Ginkgo Biloba 40 MG TABS Take by mouth.     losartan (COZAAR) 50 MG tablet Take 1 tablet (50 mg total) by mouth 2 (two) times daily. 180 tablet 3   Misc Natural Products (OSTEO BI-FLEX/5-LOXIN ADVANCED PO) Osteo Bi-Flex     rosuvastatin (CRESTOR) 10 MG tablet Take 10 mg by mouth daily.     VITAMIN E PO Take by mouth daily.     No current facility-administered medications for this visit.     Review of Systems    Fatigue, low energy, generalized malaise, and dyspnea on exertion over the past week.  She has also felt at times that her heart is beating hard though not necessarily fast.  She denies chest pain, PND, orthopnea, dizziness, syncope, edema.  Appetite has been poor.  All other systems reviewed and are otherwise negative except as noted above.    Physical Exam    VS:  BP 128/68 (BP Location: Left Arm, Patient Position: Sitting, Cuff Size: Large)    Pulse 76    Ht 5\' 7"  (1.702 m)    Wt 180 lb (81.6 kg)    SpO2 98%    BMI 28.19 kg/m  , BMI Body mass index is 28.19 kg/m.     GEN: Well nourished, well developed, in no acute distress. HEENT:  normal. Neck: Supple, no JVD, carotid bruits, or masses. Cardiac: RRR, no murmurs, rubs, or gallops. No clubbing, cyanosis, edema.  Radials/PT 2+ and equal bilaterally.  Respiratory:  Respirations regular and unlabored, clear to auscultation bilaterally. GI: Soft, nontender, nondistended, BS + x 4. MS: no deformity or atrophy. Skin: warm and dry, no rash. Neuro:  Strength and sensation are intact. Psych: Normal affect.  Accessory Clinical Findings    ECG personally reviewed by me today -atrial flutter with 4-1 conduction, 76, left axis deviation- no acute changes.  Labs dated April 30, 2021 from care everywhere  Hemoglobin 12.2, hematocrit 38.5, WBC 5.6, platelets 304 Sodium 142, potassium  4.1, chloride 104, BUN 17, creatinine 0.9, glucose 78 Total bilirubin 0.5 alkaline phosphatase 99, AST 20, ALT 16 Total protein 6.9, albumin 4.0 Hemoglobin A1c 5.9 Total cholesterol 221, triglycerides 154, HDL 55.1, LDL 135  Assessment & Plan    1.  Atrial flutter: Patient with prior history of atrial myxoma and prior atrial/supraventricular arrhythmias including paroxysmal atrial fibrillation, AVNRT, and ? Of aflutter on prior monitoring.  She is status post catheter ablation for AVNRT in early 2022 with subsequent monitoring showing PAF, as well as Mobitz 1 and 2 heart block, prohibiting the use of AV nodal blocking agents.  Over the past week, following a cortisone injection in her right ankle, she has been experiencing fatigue and low energy, as well as dyspnea on exertion.  At times, she feels as though her heart is beating hard, though not necessarily fast.  EKG today shows atrial flutter with 4:1 conduction and a heart rate of 76.  We discussed the diagnosis and management plan of atrial flutter at length today.  She has previously had issues with affording oral anticoagulation and was intolerant to warfarin in the past.  I have provided her with samples for Eliquis 5 mg twice daily (CHA2DS2-VASc  equals 4).  I will follow-up a CBC, basic metabolic panel, TSH, and magnesium today.  I will also schedule her for 2D echocardiogram in the setting fatigue and dyspnea on exertion (last echo was 2018 with normal LV function).  I will plan to see her back in approximately 2 to 3 weeks to reevaluate rhythm and ordered studies and subsequently plan on direct-current cardioversion if she remains in atrial flutter.  Finally, I will refer her back to Dr. Quentin Ore, to evaluate potential ablative versus antiarrhythmic options.  She understands that if symptoms progress prior to her next follow-up appointment, she is to contact us and we can arrange for TEE and cardioversion sooner.  2.  PSVT/AVNRT: Quiescent status post catheter ablation in February 2022.  3.  Paroxysmal atrial fibrillation: Previously noted on event monitoring.  See 1 regarding atrial flutter diagnosis today.  CHA2DS2-VASc equals 4.  4.  Essential hypertension: She says blood pressures have been running higher at home recently though stable today at 128/68.  Continue losartan.  5.  Hyperlipidemia: She remains on statin therapy with an LDL of 135 in November.  This been followed by her primary care provider.  No prior history of vascular disease.    6.  Mobitz 1/2 heart block: Previously noted on event monitoring March 2022.  Avoiding AV nodal blocking agents.  7.  Disposition: Follow-up lab work and echocardiogram.  Follow-up in clinic in 2 to 3 weeks or sooner if necessary.  Murray Hodgkins, NP 07/29/2021, 9:41 AM

## 2021-07-30 ENCOUNTER — Ambulatory Visit: Payer: PPO | Admitting: Internal Medicine

## 2021-07-30 LAB — CBC
Hematocrit: 35.8 % (ref 34.0–46.6)
Hemoglobin: 13.2 g/dL (ref 11.1–15.9)
MCH: 34.1 pg — ABNORMAL HIGH (ref 26.6–33.0)
MCHC: 36.9 g/dL — ABNORMAL HIGH (ref 31.5–35.7)
MCV: 93 fL (ref 79–97)
Platelets: 317 10*3/uL (ref 150–450)
RBC: 3.87 x10E6/uL (ref 3.77–5.28)
RDW: 12.4 % (ref 11.7–15.4)
WBC: 7.8 10*3/uL (ref 3.4–10.8)

## 2021-07-30 LAB — BASIC METABOLIC PANEL
BUN/Creatinine Ratio: 19 (ref 12–28)
BUN: 21 mg/dL (ref 8–27)
CO2: 25 mmol/L (ref 20–29)
Calcium: 9.3 mg/dL (ref 8.7–10.3)
Chloride: 104 mmol/L (ref 96–106)
Creatinine, Ser: 1.11 mg/dL — ABNORMAL HIGH (ref 0.57–1.00)
Glucose: 101 mg/dL — ABNORMAL HIGH (ref 70–99)
Potassium: 4.6 mmol/L (ref 3.5–5.2)
Sodium: 142 mmol/L (ref 134–144)
eGFR: 50 mL/min/{1.73_m2} — ABNORMAL LOW (ref >59)

## 2021-07-30 LAB — TSH: TSH: 1.16 u[IU]/mL (ref 0.450–4.500)

## 2021-07-30 LAB — MAGNESIUM: Magnesium: 2.3 mg/dL (ref 1.6–2.3)

## 2021-07-31 ENCOUNTER — Other Ambulatory Visit: Payer: Self-pay

## 2021-07-31 ENCOUNTER — Telehealth: Payer: Self-pay | Admitting: *Deleted

## 2021-07-31 ENCOUNTER — Ambulatory Visit (INDEPENDENT_AMBULATORY_CARE_PROVIDER_SITE_OTHER): Payer: PPO

## 2021-07-31 DIAGNOSIS — I483 Typical atrial flutter: Secondary | ICD-10-CM | POA: Diagnosis not present

## 2021-07-31 LAB — ECHOCARDIOGRAM COMPLETE
AV Vena cont: 0.4 cm
Area-P 1/2: 5.02 cm2
Calc EF: 60.9 %
S' Lateral: 2 cm
Single Plane A2C EF: 52.8 %
Single Plane A4C EF: 63.9 %

## 2021-07-31 NOTE — Telephone Encounter (Signed)
Left voicemail message to call back for review of results.  

## 2021-07-31 NOTE — Telephone Encounter (Signed)
-----   Message from Theora Gianotti, NP sent at 07/30/2021  5:19 PM EST ----- Blood counts look good.  Electrolytes are normal.  Creatinine is very mildly elevated, which may suggest mild dehydration.  Magnesium is normal.  TSH is normal.

## 2021-08-01 ENCOUNTER — Encounter: Payer: Self-pay | Admitting: *Deleted

## 2021-08-01 NOTE — Telephone Encounter (Signed)
Reviewed results and recommendations with patient. She verbalized understanding with no further questions and requested we send her results via mail.

## 2021-08-07 ENCOUNTER — Other Ambulatory Visit: Payer: Self-pay

## 2021-08-07 ENCOUNTER — Ambulatory Visit
Admission: RE | Admit: 2021-08-07 | Discharge: 2021-08-07 | Disposition: A | Discharge status: Home / Self Care | Payer: PPO | Source: Ambulatory Visit | Attending: Physician Assistant | Admitting: Physician Assistant

## 2021-08-07 DIAGNOSIS — Z1231 Encounter for screening mammogram for malignant neoplasm of breast: Secondary | ICD-10-CM | POA: Diagnosis not present

## 2021-08-12 ENCOUNTER — Ambulatory Visit (INDEPENDENT_AMBULATORY_CARE_PROVIDER_SITE_OTHER): Payer: PPO | Admitting: Nurse Practitioner

## 2021-08-12 ENCOUNTER — Other Ambulatory Visit: Payer: Self-pay

## 2021-08-12 ENCOUNTER — Encounter: Payer: Self-pay | Admitting: Nurse Practitioner

## 2021-08-12 VITALS — BP 154/80 | HR 72 | Ht 67.0 in | Wt 183.4 lb

## 2021-08-12 DIAGNOSIS — I480 Paroxysmal atrial fibrillation: Secondary | ICD-10-CM

## 2021-08-12 DIAGNOSIS — I483 Typical atrial flutter: Secondary | ICD-10-CM

## 2021-08-12 DIAGNOSIS — I10 Essential (primary) hypertension: Secondary | ICD-10-CM

## 2021-08-12 DIAGNOSIS — I471 Supraventricular tachycardia, unspecified: Secondary | ICD-10-CM

## 2021-08-12 DIAGNOSIS — I441 Atrioventricular block, second degree: Secondary | ICD-10-CM

## 2021-08-12 MED ORDER — AMLODIPINE BESYLATE 2.5 MG PO TABS: 2.5000 mg | ORAL_TABLET | Freq: Every day | ORAL | 3 refills | Status: DC

## 2021-08-12 NOTE — H&P (View-Only) (Signed)
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  Past Medical History    Past Medical History:  Diagnosis Date   Atrial flutter ([place])    Atrial myxoma    a. 2016 s/p resection ([facility]); b. 04/2017 Echo: EF 60-65%, no rwma, mild AI/MR. Mild-mod TR. Nl PASP; c. 07/2021 Echo: EF 60-65%, no rwma, nl RV fxn, mild MR/AI. Ao sclerosis.   History of stress test    a. 04/2017 ETT: Poor ex capacity. No ischemia. Episode of PSVT noted.   Hyperlipidemia    Hypertension    Mobitz I    Mobitz II    Paroxysmal atrial fibrillation (HCC)    a. CHA2DS2VASc = 4.  Did not tolerate Warfarin. Difficulty in affording DOAC. Eliquis added 07/2021 (new Aflutter).   Paroxysmal SVT (supraventricular tachycardia) ([place])    a. 06/2020 Zio: 3813 SVT runs - longest 64m 47s, max rate 231; b. 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   Past Surgical History:  Procedure Laterality Date   ANKLE SURGERY Left    EXCISION OF ATRIAL MYXOMA     PARTIAL HYSTERECTOMY     SVT ABLATION N/A 07/30/2020   Procedure: SVT ABLATION;  Surgeon: Evans Lance, MD;  Location: [place] [facility];  Service: Cardiovascular;  Laterality: N/A;    Allergies  Allergies  Allergen Reactions   Red Yeast Rice  [Cholestin] Other (See Comments)   Saccharin Other (See Comments)   Warfarin And Related     anorexia    History of Present Illness    81 year old female with the above past medical history including atrial myxoma, paroxysmal atrial fibrillation, PSVT/AVNRT, Mobitz 1 heart block, hypertension,  hyperlipidemia, and arthritis.  She previously underwent resection of atrial myxoma at [facility] in April 2016.  Following resection, she noted worsening of palpitations and fatigue, which was initially managed with atenolol therapy, and subsequently switched to diltiazem, which caused headaches, and finally, switch to metoprolol.  She was on warfarin for a period of time in the setting of paroxysmal atrial fibrillation following myxoma resection.  She establish care in our office November 2017.  Exercise treadmill testing in November 2018 was notable for poor exercise tolerance, an episode of PSVT, and no evidence of ischemia.  Echocardiogram in November 2018 showed an EF of 60 to 65% without regional wall motion abnormalities, mild AI/MR, and mild to moderate TR.  In early 2022, in the setting of worsening palpitations, she wore an event monitor which showed 3813 runs of SVT.  Mobitz 1 AV block was also noted.  She underwent successful catheter ablation for AVNRT in February 2022.  Event monitor was placed following the procedure was notable for 135 episodes of AV block including Mobitz 1 and Mobitz 2.  Wenckebach was detected within +/- 45 seconds of symptomatic events.  Episodes of paroxysmal atrial fibrillation could not be excluded and she was placed on Eliquis however, this was subsequently discontinued due to cost and she preferred to take aspirin.  She was last seen in cardiology clinic on February 7 with complaints of a 1  week history of fatigue, low energy, and dyspnea on exertion.  She noted the heart rates were variable on her home blood pressure cuff in the 70s to 150s.  She was found to be in 4-1 atrial flutter at a rate of 76 in the office.  She was provided with Eliquis samples.  CBC, basic metabolic panel, TSH, and magnesium were unremarkable.  Echo performed February 9 showed an EF of 60 to 65% without regional wall motion abnormalities, mild MR, normal atrial sizes, and mild AI with aortic sclerosis  as well.  Plan was made for follow-up today with cardioversion if appropriate as well as EP referral to discuss ablative versus antiarrhythmic agents.  Since her last visit, she has continued to feel fatigued with some dyspnea on exertion.  She feels as though she can hear her heart beating when she lays on her right side but has not been experiencing any tachypalpitations.  She denies chest pain, PND, orthopnea, dizziness, syncope, edema, or early satiety.  Home Medications    Current Outpatient Medications  Medication Sig Dispense Refill   amLODipine (NORVASC) 2.5 MG tablet Take 1 tablet (2.5 mg total) by mouth daily. 90 tablet 3   apixaban (ELIQUIS) 5 MG TABS tablet Take 1 tablet (5 mg total) by mouth 2 (two) times daily. 60 tablet 3   ascorbic acid (VITAMIN C) 500 MG tablet Take 500 mg by mouth daily.     Cholecalciferol (VITAMIN D PO) Take by mouth daily.     Coenzyme Q10 10 MG capsule Take by mouth.     cyanocobalamin 50 MCG tablet Take by mouth.     Ginkgo Biloba 40 MG TABS Take by mouth.     losartan (COZAAR) 50 MG tablet Take 1 tablet (50 mg total) by mouth 2 (two) times daily. 180 tablet 3   Misc Natural Products (OSTEO BI-FLEX/5-LOXIN ADVANCED PO) Osteo Bi-Flex     rosuvastatin (CRESTOR) 10 MG tablet Take 10 mg by mouth daily.     VITAMIN E PO Take by mouth daily.     No current facility-administered medications for this visit.    Family History    Family History  Problem Relation Age of Onset   Stroke Mother 39   Heart attack Father 63   Cancer Father        throat   Cancer Sister 77       lung   Cancer Brother        lung   Heart attack Brother 48     Social History    Social History   Socioeconomic History   Marital status: Married    Spouse name: Not on file   Number of children: Not on file   Years of education: Not on file   Highest education level: Not on file  Occupational History   Not on file  Tobacco Use   Smoking status: Never   Smokeless  tobacco: Never  Vaping Use   Vaping Use: Never used  Substance and Sexual Activity   Alcohol use: No   Drug use: No   Sexual activity: Not on file  Other Topics Concern   Not on file  Social History Narrative   Not on file   Social Determinants of Health   Financial Resource Strain: Not on file  Food Insecurity: Not on file  Transportation Needs: Not on file  Physical Activity: Not on file  Stress: Not on file  Social Connections: Not on file  Intimate Partner Violence:  Not on file     Review of Systems    Ongoing fatigue with some dyspnea on exertion.  She denies chest pain, palpitations, PND, orthopnea, dizziness, syncope, edema, or early satiety.  All other systems reviewed and are otherwise negative except as noted above.    Physical Exam    VS:  BP (!) 154/80    Pulse 72    Ht 5\' 7"  (1.702 m)    Wt 183 lb 6.4 oz (83.2 kg)    SpO2 96%    BMI 28.72 kg/m  , BMI Body mass index is 28.72 kg/m.     GEN: Well nourished, well developed, in no acute distress. HEENT: normal. Neck: Supple, no JVD, carotid bruits, or masses. Cardiac: RRR, no murmurs, rubs, or gallops. No clubbing, cyanosis, edema.  Radials/PT 2+ and equal bilaterally.  Respiratory:  Respirations regular and unlabored, clear to auscultation bilaterally. GI: Soft, nontender, nondistended, BS + x 4. MS: no deformity or atrophy. Skin: warm and dry, no rash. Neuro:  Strength and sensation are intact. Psych: Normal affect.  Accessory Clinical Findings    ECG personally reviewed by me today -atrial flutter with 4-1 conduction- no acute changes.  Lab Results  Component Value Date   WBC 7.8 07/29/2021   HGB 13.2 07/29/2021   HCT 35.8 07/29/2021   MCV 93 07/29/2021   PLT 317 07/29/2021   Lab Results  Component Value Date   CREATININE 1.11 (H) 07/29/2021   BUN 21 07/29/2021   NA 142 07/29/2021   K 4.6 07/29/2021   CL 104 07/29/2021   CO2 25 07/29/2021   Lab Results  Component Value Date   ALT 18  04/14/2017   AST 21 04/14/2017   ALKPHOS 85 04/14/2017   BILITOT 0.5 04/14/2017   Lab Results  Component Value Date   CHOL 257 (H) 06/17/2010   HDL 46.60 06/17/2010   LDLCALC 179 01/21/2009   LDLCALC 179 01/21/2009   LDLCALC 179 01/21/2009   LDLDIRECT 183.0 06/17/2010   TRIG 185.0 (H) 06/17/2010   CHOLHDL 6 06/17/2010    Assessment & Plan    1.  Persistent atrial flutter: Prior history of atrial myxoma and atrial/supraventricular arrhythmias including paroxysmal atrial fibrillation, AVNRT and question of atrial flutter on prior monitoring.  She is status post catheter ablation for AVNRT in early 2022 with subsequent monitoring showing paroxysmal atrial fibrillation as well as Mobitz 1 and 2 heart blocks, prohibiting use of AV nodal blocking agents.  She was on oral anticoagulation previously but came off due to cost.  She was seen February 7 with a 1 week history of fatigue and mild dyspnea and was noted to be in 4-1 atrial flutter.  She was placed on Eliquis 5 mg twice daily.  Lab work was relatively unremarkable.  2D echo has shown normal LV function with mild MR, mild AI, aortic sclerosis, and normal atrial sizes.  She remains in atrial flutter today, and remains fatigued.  She has been compliant with Eliquis.  We discussed the risks, benefits, and indications for direct-current cardioversion, and she was agreeable to proceed.  We will plan for this to take place on March 7.  She will see Dr. Quentin Ore on March 15 to evaluate potential ablative versus antiarrhythmic options.  2.  PSVT/AVNRT: Quiescent status post catheter ablation in February 2022.  3.  Paroxysmal atrial fibrillation: Previously noted on event monitoring.  See #1 regarding atrial flutter.  CHA2DS2-VASc equals 4.  She is anticoagulated with Eliquis now.  4.  Essential hypertension: Blood pressure higher today at 154/80.  She is on maximum dose losartan.  I am adding amlodipine 2.5 mg daily.  5.  Mobitz 1 and 2 heart block:  Previously noted on event monitoring March 2022.  Avoiding AV nodal blocking agents.  6.  Disposition: Patient is scheduled for direct-current cardioversion with Dr. Saunders Revel on March 7.  She will follow-up with electrophysiology on March 15.  Murray Hodgkins, NP 08/12/2021, 12:45 PM

## 2021-08-12 NOTE — Progress Notes (Signed)
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  Past Medical History    Past Medical History:  Diagnosis Date   Atrial flutter ([place])    Atrial myxoma    a. 2016 s/p resection ([facility]); b. 04/2017 Echo: EF 60-65%, no rwma, mild AI/MR. Mild-mod TR. Nl PASP; c. 07/2021 Echo: EF 60-65%, no rwma, nl RV fxn, mild MR/AI. Ao sclerosis.   History of stress test    a. 04/2017 ETT: Poor ex capacity. No ischemia. Episode of PSVT noted.   Hyperlipidemia    Hypertension    Mobitz I    Mobitz II    Paroxysmal atrial fibrillation (HCC)    a. CHA2DS2VASc = 4.  Did not tolerate Warfarin. Difficulty in affording DOAC. Eliquis added 07/2021 (new Aflutter).   Paroxysmal SVT (supraventricular tachycardia) ([place])    a. 06/2020 Zio: 3813 SVT runs - longest 67m 47s, max rate 231; b. 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   Past Surgical History:  Procedure Laterality Date   ANKLE SURGERY Left    EXCISION OF ATRIAL MYXOMA     PARTIAL HYSTERECTOMY     SVT ABLATION N/A 07/30/2020   Procedure: SVT ABLATION;  Surgeon: Evans Lance, MD;  Location: [facility];  Service: Cardiovascular;  Laterality: N/A;    Allergies  Allergies  Allergen Reactions   Red Yeast Rice  [Cholestin] Other (See Comments)   Saccharin Other (See Comments)   Warfarin And Related     anorexia    History of Present Illness    81 year old female with the above past medical history including atrial myxoma, paroxysmal atrial fibrillation, PSVT/AVNRT, Mobitz 1 heart block, hypertension,  hyperlipidemia, and arthritis.  She previously underwent resection of atrial myxoma at [facility] in April 2016.  Following resection, she noted worsening of palpitations and fatigue, which was initially managed with atenolol therapy, and subsequently switched to diltiazem, which caused headaches, and finally, switch to metoprolol.  She was on warfarin for a period of time in the setting of paroxysmal atrial fibrillation following myxoma resection.  She establish care in our office November 2017.  Exercise treadmill testing in November 2018 was notable for poor exercise tolerance, an episode of PSVT, and no evidence of ischemia.  Echocardiogram in November 2018 showed an EF of 60 to 65% without regional wall motion abnormalities, mild AI/MR, and mild to moderate TR.  In early 2022, in the setting of worsening palpitations, she wore an event monitor which showed 3813 runs of SVT.  Mobitz 1 AV block was also noted.  She underwent successful catheter ablation for AVNRT in February 2022.  Event monitor was placed following the procedure was notable for 135 episodes of AV block including Mobitz 1 and Mobitz 2.  Wenckebach was detected within +/- 45 seconds of symptomatic events.  Episodes of paroxysmal atrial fibrillation could not be excluded and she was placed on Eliquis however, this was subsequently discontinued due to cost and she preferred to take aspirin.  She was last seen in cardiology clinic on February 7 with complaints of a 1  week history of fatigue, low energy, and dyspnea on exertion.  She noted the heart rates were variable on her home blood pressure cuff in the 70s to 150s.  She was found to be in 4-1 atrial flutter at a rate of 76 in the office.  She was provided with Eliquis samples.  CBC, basic metabolic panel, TSH, and magnesium were unremarkable.  Echo performed February 9 showed an EF of 60 to 65% without regional wall motion abnormalities, mild MR, normal atrial sizes, and mild AI with aortic sclerosis  as well.  Plan was made for follow-up today with cardioversion if appropriate as well as EP referral to discuss ablative versus antiarrhythmic agents.  Since her last visit, she has continued to feel fatigued with some dyspnea on exertion.  She feels as though she can hear her heart beating when she lays on her right side but has not been experiencing any tachypalpitations.  She denies chest pain, PND, orthopnea, dizziness, syncope, edema, or early satiety.  Home Medications    Current Outpatient Medications  Medication Sig Dispense Refill   amLODipine (NORVASC) 2.5 MG tablet Take 1 tablet (2.5 mg total) by mouth daily. 90 tablet 3   apixaban (ELIQUIS) 5 MG TABS tablet Take 1 tablet (5 mg total) by mouth 2 (two) times daily. 60 tablet 3   ascorbic acid (VITAMIN C) 500 MG tablet Take 500 mg by mouth daily.     Cholecalciferol (VITAMIN D PO) Take by mouth daily.     Coenzyme Q10 10 MG capsule Take by mouth.     cyanocobalamin 50 MCG tablet Take by mouth.     Ginkgo Biloba 40 MG TABS Take by mouth.     losartan (COZAAR) 50 MG tablet Take 1 tablet (50 mg total) by mouth 2 (two) times daily. 180 tablet 3   Misc Natural Products (OSTEO BI-FLEX/5-LOXIN ADVANCED PO) Osteo Bi-Flex     rosuvastatin (CRESTOR) 10 MG tablet Take 10 mg by mouth daily.     VITAMIN E PO Take by mouth daily.     No current facility-administered medications for this visit.    Family History    Family History  Problem Relation Age of Onset   Stroke Mother 66   Heart attack Father 54   Cancer Father        throat   Cancer Sister 33       lung   Cancer Brother        lung   Heart attack Brother 31     Social History    Social History   Socioeconomic History   Marital status: Married    Spouse name: Not on file   Number of children: Not on file   Years of education: Not on file   Highest education level: Not on file  Occupational History   Not on file  Tobacco Use   Smoking status: Never   Smokeless  tobacco: Never  Vaping Use   Vaping Use: Never used  Substance and Sexual Activity   Alcohol use: No   Drug use: No   Sexual activity: Not on file  Other Topics Concern   Not on file  Social History Narrative   Not on file   Social Determinants of Health   Financial Resource Strain: Not on file  Food Insecurity: Not on file  Transportation Needs: Not on file  Physical Activity: Not on file  Stress: Not on file  Social Connections: Not on file  Intimate Partner Violence:  Not on file     Review of Systems    Ongoing fatigue with some dyspnea on exertion.  She denies chest pain, palpitations, PND, orthopnea, dizziness, syncope, edema, or early satiety.  All other systems reviewed and are otherwise negative except as noted above.    Physical Exam    VS:  BP (!) 154/80    Pulse 72    Ht 5\' 7"  (1.702 m)    Wt 183 lb 6.4 oz (83.2 kg)    SpO2 96%    BMI 28.72 kg/m  , BMI Body mass index is 28.72 kg/m.     GEN: Well nourished, well developed, in no acute distress. HEENT: normal. Neck: Supple, no JVD, carotid bruits, or masses. Cardiac: RRR, no murmurs, rubs, or gallops. No clubbing, cyanosis, edema.  Radials/PT 2+ and equal bilaterally.  Respiratory:  Respirations regular and unlabored, clear to auscultation bilaterally. GI: Soft, nontender, nondistended, BS + x 4. MS: no deformity or atrophy. Skin: warm and dry, no rash. Neuro:  Strength and sensation are intact. Psych: Normal affect.  Accessory Clinical Findings    ECG personally reviewed by me today -atrial flutter with 4-1 conduction- no acute changes.  Lab Results  Component Value Date   WBC 7.8 07/29/2021   HGB 13.2 07/29/2021   HCT 35.8 07/29/2021   MCV 93 07/29/2021   PLT 317 07/29/2021   Lab Results  Component Value Date   CREATININE 1.11 (H) 07/29/2021   BUN 21 07/29/2021   NA 142 07/29/2021   K 4.6 07/29/2021   CL 104 07/29/2021   CO2 25 07/29/2021   Lab Results  Component Value Date   ALT 18  04/14/2017   AST 21 04/14/2017   ALKPHOS 85 04/14/2017   BILITOT 0.5 04/14/2017   Lab Results  Component Value Date   CHOL 257 (H) 06/17/2010   HDL 46.60 06/17/2010   LDLCALC 179 01/21/2009   LDLCALC 179 01/21/2009   LDLCALC 179 01/21/2009   LDLDIRECT 183.0 06/17/2010   TRIG 185.0 (H) 06/17/2010   CHOLHDL 6 06/17/2010    Assessment & Plan    1.  Persistent atrial flutter: Prior history of atrial myxoma and atrial/supraventricular arrhythmias including paroxysmal atrial fibrillation, AVNRT and question of atrial flutter on prior monitoring.  She is status post catheter ablation for AVNRT in early 2022 with subsequent monitoring showing paroxysmal atrial fibrillation as well as Mobitz 1 and 2 heart blocks, prohibiting use of AV nodal blocking agents.  She was on oral anticoagulation previously but came off due to cost.  She was seen February 7 with a 1 week history of fatigue and mild dyspnea and was noted to be in 4-1 atrial flutter.  She was placed on Eliquis 5 mg twice daily.  Lab work was relatively unremarkable.  2D echo has shown normal LV function with mild MR, mild AI, aortic sclerosis, and normal atrial sizes.  She remains in atrial flutter today, and remains fatigued.  She has been compliant with Eliquis.  We discussed the risks, benefits, and indications for direct-current cardioversion, and she was agreeable to proceed.  We will plan for this to take place on March 7.  She will see Dr. Quentin Ore on March 15 to evaluate potential ablative versus antiarrhythmic options.  2.  PSVT/AVNRT: Quiescent status post catheter ablation in February 2022.  3.  Paroxysmal atrial fibrillation: Previously noted on event monitoring.  See #1 regarding atrial flutter.  CHA2DS2-VASc equals 4.  She is anticoagulated with Eliquis now.  4.  Essential hypertension: Blood pressure higher today at 154/80.  She is on maximum dose losartan.  I am adding amlodipine 2.5 mg daily.  5.  Mobitz 1 and 2 heart block:  Previously noted on event monitoring March 2022.  Avoiding AV nodal blocking agents.  6.  Disposition: Patient is scheduled for direct-current cardioversion with Dr. Saunders Revel on March 7.  She will follow-up with electrophysiology on March 15.  Murray Hodgkins, NP 08/12/2021, 12:45 PM

## 2021-08-12 NOTE — Patient Instructions (Addendum)
Medication Instructions:  Your physician has recommended you make the following change in your medication:   START Amlodipine 2.5 mg once daily  *If you need a refill on your cardiac medications before your next appointment, please call your pharmacy*   Lab Work: None  If you have labs (blood work) drawn today and your tests are completely normal, you will receive your results only by: [place] (if you have MyChart) OR A paper copy in the mail If you have any lab test that is abnormal or we need to change your treatment, we will call you to review the results.   Testing/Procedures: You are scheduled for a Cardioversion on August 26, 2021 with Dr. Saunders Revel Please arrive at the [facility] at 06:30 a.m. on the day of your procedure.  DIET INSTRUCTIONS:  Nothing to eat or drink after midnight except your medications with a small sip of water.   Medications:  MAKE SURE NOT TO MISS ANY DOSES OF YOUR ELIQUIS. YOU MAY TAKE ALL of your remaining medications with a small amount of water.  Must have a responsible person to drive you home.  Bring a current list of your medications and current insurance cards.    If you have any questions after you get home, please call the office at [phone]    Follow-Up: At [facility], you and your health needs are our priority.  As part of our continuing mission to provide you with exceptional heart care, we have created designated Provider Care Teams.  These Care Teams include your primary Cardiologist (physician) and Advanced Practice Providers (APPs -  Physician Assistants and Nurse Practitioners) who all work together to provide you with the care you need, when you need it.  We recommend signing up for the patient portal called "MyChart".  Sign up information is provided on this After Visit Summary.  MyChart is used to connect with patients for Virtual Visits (Telemedicine).  Patients are able to view lab/test results, encounter notes,  upcoming appointments, etc.  Non-urgent messages can be sent to your provider as well.   To learn more about what you can do with MyChart, go to [email or web address].    Your next appointment:   Keep follow up appointment with Dr. Quentin Ore on 09/03/21 at 10:20 am  The format for your next appointment:   In Person  Provider:   Lars Mage, MD

## 2021-08-26 ENCOUNTER — Encounter
Admission: RE | Disposition: A | Discharge status: Home / Self Care | Payer: Self-pay | Source: Home / Self Care | Attending: Internal Medicine

## 2021-08-26 ENCOUNTER — Ambulatory Visit
Admission: RE | Admit: 2021-08-26 | Discharge: 2021-08-26 | Disposition: A | Discharge status: Home / Self Care | Payer: PPO | Attending: Internal Medicine | Admitting: Internal Medicine

## 2021-08-26 ENCOUNTER — Ambulatory Visit: Payer: PPO | Admitting: Anesthesiology

## 2021-08-26 ENCOUNTER — Encounter: Payer: Self-pay | Admitting: Internal Medicine

## 2021-08-26 ENCOUNTER — Other Ambulatory Visit: Payer: Self-pay

## 2021-08-26 DIAGNOSIS — I10 Essential (primary) hypertension: Secondary | ICD-10-CM | POA: Diagnosis not present

## 2021-08-26 DIAGNOSIS — E785 Hyperlipidemia, unspecified: Secondary | ICD-10-CM | POA: Insufficient documentation

## 2021-08-26 DIAGNOSIS — Z7901 Long term (current) use of anticoagulants: Secondary | ICD-10-CM | POA: Diagnosis not present

## 2021-08-26 DIAGNOSIS — I480 Paroxysmal atrial fibrillation: Secondary | ICD-10-CM | POA: Diagnosis not present

## 2021-08-26 DIAGNOSIS — I441 Atrioventricular block, second degree: Secondary | ICD-10-CM | POA: Insufficient documentation

## 2021-08-26 DIAGNOSIS — I483 Typical atrial flutter: Secondary | ICD-10-CM | POA: Diagnosis not present

## 2021-08-26 DIAGNOSIS — I4892 Unspecified atrial flutter: Secondary | ICD-10-CM

## 2021-08-26 DIAGNOSIS — M1990 Unspecified osteoarthritis, unspecified site: Secondary | ICD-10-CM | POA: Insufficient documentation

## 2021-08-26 DIAGNOSIS — I4891 Unspecified atrial fibrillation: Secondary | ICD-10-CM | POA: Diagnosis not present

## 2021-08-26 DIAGNOSIS — T8111XD Postprocedural  cardiogenic shock, subsequent encounter: Secondary | ICD-10-CM | POA: Diagnosis not present

## 2021-08-26 HISTORY — PX: CARDIOVERSION: SHX1299

## 2021-08-26 SURGERY — CARDIOVERSION
Anesthesia: General

## 2021-08-26 MED ORDER — SODIUM CHLORIDE 0.9 % IV SOLN: INTRAVENOUS | Status: DC

## 2021-08-26 MED ORDER — ONDANSETRON HCL 4 MG/2ML IJ SOLN: 4.0000 mg | Freq: Once | INTRAMUSCULAR | Status: DC | PRN

## 2021-08-26 MED ORDER — PROPOFOL 10 MG/ML IV BOLUS
INTRAVENOUS | Status: DC | PRN
  Administered 2021-08-26: 50 mg via INTRAVENOUS

## 2021-08-26 MED ORDER — PROPOFOL 10 MG/ML IV BOLUS
INTRAVENOUS | Status: AC
  Filled 2021-08-26: qty 20

## 2021-08-26 MED ORDER — FENTANYL CITRATE (PF) 100 MCG/2ML IJ SOLN: 25.0000 ug | INTRAMUSCULAR | Status: DC | PRN

## 2021-08-26 NOTE — Interval H&P Note (Signed)
History and Physical Interval Note: ? ?08/26/2021 ?7:19 AM ? ?Julie Duncan  has presented today for surgery, with the diagnosis of atrial fibrillation.  The various methods of treatment have been discussed with the patient and family. After consideration of risks, benefits and other options for treatment, the patient has consented to  Procedure(s): ?CARDIOVERSION (N/A) as a surgical intervention.  The patient's history has been reviewed, patient examined, no change in status, stable for surgery.  I have reviewed the patient's chart and labs.  Questions were answered to the patient's satisfaction.   ? ? ?Shine Mikes ? ? ?

## 2021-08-26 NOTE — CV Procedure (Signed)
? ? ?  Cardioversion Note ? HENNESY SOBALVARRO ?[id number] ?18-Jan-1941 ? ?Procedure: DC Cardioversion ?Indications: Atrial flutter ? ?Procedure Details ?Consent: Obtained ?Time Out: Verified patient identification, verified procedure, site/side was marked, verified correct patient position, special equipment/implants available, Radiology Safety Procedures followed,  medications/allergies/relevent history reviewed, required imaging and test results available.  Performed ? ?The patient has been on adequate anticoagulation.  The patient received IV propofol by anesthesia for sedation.  Synchronous cardioversion was performed at 50 joules. ? ?The cardioversion was successful with conversion to sinus rhythm with Mobitz type 1 second degree AV block. ? ?Complications: No apparent complications ?Patient did tolerate procedure well. ? ?Nelva Bush., MD ?08/26/2021, 7:39 AM ? ? ? ? ? ? ?

## 2021-08-26 NOTE — Anesthesia Preprocedure Evaluation (Signed)
Anesthesia Evaluation  ?Patient identified by MRN, date of birth, ID band ?Patient awake ? ? ? ?Reviewed: ?Allergy & Precautions, H&P , NPO status , Patient's Chart, lab work & pertinent test results, reviewed documented beta blocker date and time  ? ?Airway ?Mallampati: II ? ? ?Neck ROM: full ? ? ? Dental ? ?(+) Poor Dentition ?  ?Pulmonary ?neg pulmonary ROS,  ?  ?Pulmonary exam normal ? ? ? ? ? ? ? Cardiovascular ?Exercise Tolerance: Poor ?hypertension, On Medications ?+ dysrhythmias Atrial Fibrillation  ?Rhythm:regular Rate:Normal ? ? ?  ?Neuro/Psych ?negative neurological ROS ? negative psych ROS  ? GI/Hepatic ?Neg liver ROS, GERD  Medicated,  ?Endo/Other  ?negative endocrine ROS ? Renal/GU ?negative Renal ROS  ?negative genitourinary ?  ?Musculoskeletal ? ? Abdominal ?  ?Peds ? Hematology ?negative hematology ROS ?(+)   ?Anesthesia Other Findings ?Past Medical History: ?No date: Atrial flutter ([place]) ?No date: Atrial myxoma ?    Comment:  a. 2016 s/p resection ([facility]); b. 04/2017 Echo: EF  ?             60-65%, no rwma, mild AI/MR. Mild-mod TR. Nl PASP; c.  ?             07/2021 Echo: EF 60-65%, no rwma, nl RV fxn, mild MR/AI.  ?             Ao sclerosis. ?No date: History of stress test ?    Comment:  a. 04/2017 ETT: Poor ex capacity. No ischemia. Episode  ?             of PSVT noted. ?No date: Hyperlipidemia ?No date: Hypertension ?No date: Mobitz I ?No date: Mobitz II ?No date: Paroxysmal atrial fibrillation (HCC) ?    Comment:  a. CHA2DS2VASc = 4.  Did not tolerate Warfarin.  ?             Difficulty in affording DOAC. Eliquis added 07/2021 (new  ?             Aflutter). ?No date: Paroxysmal SVT (supraventricular tachycardia) ([place]) ?    Comment:  a. 06/2020 Zio: 3813 SVT runs - longest 36m47s, max rate ?             231; b. 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 ?Past Surgical History: ?No date: ANKLE SURGERY; Left ?No date: EXCISION OF ATRIAL MYXOMA ?No date: PARTIAL  HYSTERECTOMY ?07/30/2020: SVT ABLATION; N/A ?    Comment:  Procedure: SVT ABLATION;  Surgeon: TEvans Lance MD;  ?             Location: [facility];  Service: Cardiovascular;   ?             Laterality: N/A; ?BMI   ? Body Mass Index: 29.54 kg/m?  ?  ? Reproductive/Obstetrics ?negative OB ROS ? ?  ? ? ? ? ? ? ? ? ? ? ? ? ? ?  ?  ? ? ? ? ? ? ? ? ?Anesthesia Physical ?Anesthesia Plan ? ?ASA: 3 ? ?Anesthesia Plan: General  ? ?Post-op Pain Management:   ? ?Induction:  ? ?PONV Risk Score and Plan:  ? ?Airway Management Planned:  ? ?Additional Equipment:  ? ?Intra-op Plan:  ? ?Post-operative Plan:  ? ?Informed Consent: I have reviewed the patients History and Physical, chart, labs and discussed the procedure including the risks, benefits and alternatives for the proposed anesthesia with the patient or authorized representative who has indicated his/her  understanding and acceptance.  ? ? ? ?Dental Advisory Given ? ?Plan Discussed with: CRNA ? ?Anesthesia Plan Comments:   ? ? ? ? ? ? ?Anesthesia Quick Evaluation ? ?

## 2021-08-26 NOTE — Transfer of Care (Signed)
Immediate Anesthesia Transfer of Care Note ? ?Patient: Julie Duncan ? ?Procedure(s) Performed: CARDIOVERSION ? ?Patient Location: Short Stay ? ?Anesthesia Type:General ? ?Level of Consciousness: drowsy ? ?Airway & Oxygen Therapy: Patient Spontanous Breathing and Patient connected to nasal cannula oxygen ? ?Post-op Assessment: Report given to RN and Post -op Vital signs reviewed and stable ? ?Post vital signs: Reviewed and stable ? ?Last Vitals:  ?Vitals Value Taken Time  ?BP 110/66 08/26/21 0732  ?Temp    ?Pulse 78 08/26/21 0733  ?Resp 15 08/26/21 0733  ?SpO2 99 % 08/26/21 0733  ?Vitals shown include unvalidated device data. ? ?Last Pain:  ?Vitals:  ? 08/26/21 0700  ?TempSrc: Oral  ?PainSc: 0-No pain  ?   ? ?  ? ?Complications: No notable events documented. ?

## 2021-08-31 NOTE — Anesthesia Postprocedure Evaluation (Signed)
Anesthesia Post Note ? ?Patient: Julie Duncan ? ?Procedure(s) Performed: CARDIOVERSION ? ?Patient location during evaluation: PACU ?Anesthesia Type: General ?Level of consciousness: awake and alert ?Pain management: pain level controlled ?Vital Signs Assessment: post-procedure vital signs reviewed and stable ?Respiratory status: spontaneous breathing, nonlabored ventilation, respiratory function stable and patient connected to nasal cannula oxygen ?Cardiovascular status: blood pressure returned to baseline and stable ?Postop Assessment: no apparent nausea or vomiting ?Anesthetic complications: no ? ? ?No notable events documented. ? ? ?Last Vitals:  ?Vitals:  ? 08/26/21 0731 08/26/21 0732  ?BP:    ?Pulse: 83 67  ?Resp: (!) 3 18  ?Temp:    ?SpO2: 98% 99%  ?  ?Last Pain:  ?Vitals:  ? 08/26/21 0809  ?TempSrc:   ?PainSc: 0-No pain  ? ? ?  ?  ?  ?  ?  ?  ? ?Molli Barrows ? ? ? ? ?

## 2021-09-03 ENCOUNTER — Ambulatory Visit: Payer: PPO | Admitting: Cardiology

## 2021-09-03 ENCOUNTER — Encounter: Payer: Self-pay | Admitting: Cardiology

## 2021-09-03 ENCOUNTER — Other Ambulatory Visit: Payer: Self-pay

## 2021-09-03 VITALS — BP 144/70 | HR 77 | Ht 67.0 in | Wt 182.5 lb

## 2021-09-03 DIAGNOSIS — I471 Supraventricular tachycardia: Secondary | ICD-10-CM | POA: Diagnosis not present

## 2021-09-03 DIAGNOSIS — I480 Paroxysmal atrial fibrillation: Secondary | ICD-10-CM

## 2021-09-03 NOTE — Progress Notes (Signed)
?Electrophysiology Office Follow up Visit Note:   ? ?Date:  09/03/2021  ? ?[name], DOB 1940-08-07, MRN [id number] ? ?PCP:  Julie Elk, MD  ?[facility] Cardiologist:  Julie Bush, MD  ?[facility] Electrophysiologist:  Julie Epley, MD  ? ? ?Interval History:   ? ?Julie Duncan is a 81 y.o. female who presents for a follow up visit.  ?I last saw the patient February 05, 2021.  She previously had a slow pathway modification on July 30, 2020 by Dr. Lovena Duncan.  She has known AV nodal conduction disease with Mobitz 1.  She is on Eliquis for history of atrial fibrillation.  She recently started amlodipine but is having itching that she attributes to this medication. ?  ? ?Past Medical History:  ?Diagnosis Date  ? Atrial flutter ([place])   ? Atrial myxoma   ? a. 2016 s/p resection ([facility]); b. 04/2017 Echo: EF 60-65%, no rwma, mild AI/MR. Mild-mod TR. Nl PASP; c. 07/2021 Echo: EF 60-65%, no rwma, nl RV fxn, mild MR/AI. Ao sclerosis.  ? History of stress test   ? a. 04/2017 ETT: Poor ex capacity. No ischemia. Episode of PSVT noted.  ? Hyperlipidemia   ? Hypertension   ? Mobitz I   ? Mobitz II   ? Paroxysmal atrial fibrillation (HCC)   ? a. CHA2DS2VASc = 4.  Did not tolerate Warfarin. Difficulty in affording DOAC. Eliquis added 07/2021 (new Aflutter).  ? Paroxysmal SVT (supraventricular tachycardia) ([place])   ? a. 06/2020 Zio: 3813 SVT runs - longest 13m47s, max rate 231; b. 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  ? ? ?Past Surgical History:  ?Procedure Laterality Date  ? ANKLE SURGERY Left   ? CARDIOVERSION N/A 08/26/2021  ? Procedure: CARDIOVERSION;  Surgeon: Julie Bush MD;  Location: [facility] ORS;  Service: Cardiovascular;  Laterality: N/A;  ? EXCISION OF ATRIAL MYXOMA    ? PARTIAL HYSTERECTOMY    ? SVT ABLATION N/A 07/30/2020  ? Procedure: SVT ABLATION;  Surgeon: Julie Lance MD;  [facility];  Service: Cardiovascular;  Laterality: N/A;  ? ? ?Current Medications: ?Current Meds   ?Medication Sig  ? apixaban (ELIQUIS) 5 MG TABS tablet Take 1 tablet (5 mg total) by mouth 2 (two) times daily.  ? ascorbic acid (VITAMIN C) 500 MG tablet Take 500 mg by mouth daily.  ? Cholecalciferol (VITAMIN D PO) Take by mouth daily.  ? Coenzyme Q10 10 MG capsule Take by mouth.  ? cyanocobalamin 50 MCG tablet Take by mouth.  ? Ginkgo Biloba 40 MG TABS Take by mouth.  ? losartan (COZAAR) 50 MG tablet Take 1 tablet (50 mg total) by mouth 2 (two) times daily.  ? Misc Natural Products (OSTEO BI-FLEX/5-LOXIN ADVANCED PO)   ? rosuvastatin (CRESTOR) 5 MG tablet Take 5 mg by mouth 2 (two) times daily.  ? VITAMIN E PO Take by mouth daily.  ? [DISCONTINUED] amLODipine (NORVASC) 2.5 MG tablet Take 1 tablet (2.5 mg total) by mouth daily.  ?  ? ?Allergies:   Cortisone, Red yeast rice  [cholestin], Saccharin, and Warfarin and related  ? ?Social History  ? ?Socioeconomic History  ? Marital status: Married  ?  Spouse name: Not on file  ? Number of children: Not on file  ? Years of education: Not on file  ? Highest education level: Not on file  ?Occupational History  ? Not on file  ?Tobacco Use  ? Smoking status: Never  ?  Smokeless tobacco: Never  ?Vaping Use  ? Vaping Use: Never used  ?Substance and Sexual Activity  ? Alcohol use: No  ? Drug use: No  ? Sexual activity: Not on file  ?Other Topics Concern  ? Not on file  ?Social History Narrative  ? Lives alone.   ? ?Social Determinants of Health  ? ?Financial Resource Strain: Not on file  ?Food Insecurity: Not on file  ?Transportation Needs: Not on file  ?Physical Activity: Not on file  ?Stress: Not on file  ?Social Connections: Not on file  ?  ? ?Family History: ?The patient's family history includes Cancer in her brother and father; Cancer (age of onset: 67) in her sister; Heart attack (age of onset: 25) in her brother; Heart attack (age of onset: 75) in her father; Stroke (age of onset: 21) in her mother. ? ?ROS:   ?Please see the history of present illness.    ?All other  systems reviewed and are negative. ? ?EKGs/Labs/Other Studies Reviewed:   ? ?The following studies were reviewed today: ? ?July 31, 2021 echo ?Normal left ventricular function, 60% ?Right ventricular function normal ?Mild MR ? ?EKG:  The ekg ordered today demonstrates sinus rhythm.  Mobitz 1/Wenckebach, leftward axis ? ?Recent Labs: ?07/29/2021: BUN 21; Creatinine, Ser 1.11; Hemoglobin 13.2; Magnesium 2.3; Platelets 317; Potassium 4.6; Sodium 142; TSH 1.160  ?Recent Lipid Panel ?   ?Component Value Date/Time  ? CHOL 257 (H) 06/17/2010 1010  ? TRIG 185.0 (H) 06/17/2010 1010  ? HDL 46.60 06/17/2010 1010  ? CHOLHDL 6 06/17/2010 1010  ? VLDL 37.0 06/17/2010 1010  ? [place] 179 01/21/2009 0000  ? [place] 179 01/21/2009 0000  ? [place] 179 01/21/2009 0000  ? LDLDIRECT 183.0 06/17/2010 1010  ? ? ?Physical Exam:   ? ?VS:  BP (!) 144/70 (BP Location: Left Arm, Patient Position: Sitting, Cuff Size: Normal)   Pulse 77   Ht '5\' 7"'$  (1.702 m)   Wt 182 lb 8 oz (82.8 kg)   SpO2 91%   BMI 28.58 kg/m?    ? ?Wt Readings from Last 3 Encounters:  ?09/03/21 182 lb 8 oz (82.8 kg)  ?08/26/21 183 lb (83 kg)  ?08/12/21 183 lb 6.4 oz (83.2 kg)  ?  ? ?GEN:  Well nourished, well developed in no acute distress ?HEENT: Normal ?NECK: No JVD; No carotid bruits ?LYMPHATICS: No lymphadenopathy ?CARDIAC: RRR, no murmurs, rubs, gallops ?RESPIRATORY:  Clear to auscultation without rales, wheezing or rhonchi  ?ABDOMEN: Soft, non-tender, non-distended ?MUSCULOSKELETAL:  No edema; No deformity  ?SKIN: Warm and dry ?NEUROLOGIC:  Alert and oriented x 3 ?PSYCHIATRIC:  Normal affect  ? ? ? ?  ? ?ASSESSMENT:   ? ?1. PSVT (paroxysmal supraventricular tachycardia) ([place])   ?2. PAF (paroxysmal atrial fibrillation) ([place])   ? ?PLAN:   ? ?In order of problems listed above: ? ? ?#Paroxysmal atrial fibrillation ?Low burden.  On Eliquis for stroke prophylaxis.  I discussed treatment options available for her atrial fibrillation including various rhythm control  strategies.  I do not think she is a good candidate for antiarrhythmic drug therapy given her history of significant conduction system abnormalities.  She would like to continue with her current medical regimen which I think is reasonable. ? ?#SVT ?No recurrence ? ?She will follow-up with an APP in 6 months.  I can be available on an as-needed basis. ? ? ?Total time spent with patient today 40 minutes. This includes reviewing records, evaluating the patient and coordinating care.  ? ?  Medication Adjustments/Labs and Tests Ordered: ?Current medicines are reviewed at length with the patient today.  Concerns regarding medicines are outlined above.  ?Orders Placed This Encounter  ?Procedures  ? EKG 12-Lead  ? ?No orders of the defined types were placed in this encounter. ? ? ? ?Signed, ?Lars Mage, MD, [facility] [place] ?09/03/2021 9:13 PM    ?Electrophysiology ?[place] ?

## 2021-09-03 NOTE — Patient Instructions (Signed)
Medication Instructions:  ?- Your physician has recommended you make the following change in your medication:  ? ?1) STOP amlodipine  ? ?*If you need a refill on your cardiac medications before your next appointment, please call your pharmacy* ? ? ?Lab Work: ?- none ordered ? ?If you have labs (blood work) drawn today and your tests are completely normal, you will receive your results only by: ?MyChart Message (if you have MyChart) OR ?A paper copy in the mail ?If you have any lab test that is abnormal or we need to change your treatment, we will call you to review the results. ? ? ?Testing/Procedures: ?- none ordered ? ? ?Follow-Up: ?At [facility], you and your health needs are our priority.  As part of our continuing mission to provide you with exceptional heart care, we have created designated Provider Care Teams.  These Care Teams include your primary Cardiologist (physician) and Advanced Practice Providers (APPs -  Physician Assistants and Nurse Practitioners) who all work together to provide you with the care you need, when you need it. ? ?We recommend signing up for the patient portal called "MyChart".  Sign up information is provided on this After Visit Summary.  MyChart is used to connect with patients for Virtual Visits (Telemedicine).  Patients are able to view lab/test results, encounter notes, upcoming appointments, etc.  Non-urgent messages can be sent to your provider as well.   ?To learn more about what you can do with MyChart, go to [email or web address].   ? ?Your next appointment:   ?6 month(s) ? ?The format for your next appointment:   ?In Person ? ?Provider:   ?- You may see Nelva Bush, MD or one of the following Advanced Practice Providers on your designated Care Team:   ?Murray Hodgkins, NP ?Christell Faith, PA-C ?Cadence Kathlen Mody, PA-C ? ?- Dr. Quentin Ore will see you back as needed  ? ? ?Other Instructions ?N/a ? ?

## 2021-09-19 ENCOUNTER — Other Ambulatory Visit: Payer: Self-pay | Admitting: Internal Medicine

## 2021-10-06 ENCOUNTER — Telehealth: Payer: Self-pay | Admitting: Internal Medicine

## 2021-10-06 NOTE — Telephone Encounter (Signed)
Patient c/o Palpitations:  High priority if patient c/o lightheadedness, shortness of breath, or chest pain ? ?How long have you had palpitations/irregular HR/ Afib? Since her last procedure ? ?Are you having the symptoms now? yes ? ?Are you currently experiencing lightheadedness, SOB or CP? no ? ?Do you have a history of afib (atrial fibrillation) or irregular heart rhythm?  ? ?Have you checked your BP or HR? (document readings if available):  ? ?Are you experiencing any other symptoms? Fatigue ? ? ?Patient said she just feels like her heart is beating harder than normal. She said it keeps her up at night. She wants an appointment to see Dr. Saunders Revel  ?

## 2021-10-06 NOTE — Telephone Encounter (Signed)
Called the patient. Lmtcb. ?Appt scheduled with Dr. Saunders Revel 10/08/21 @ 3pm. ?Patient to be notified on return call. ?

## 2021-10-07 NOTE — Telephone Encounter (Signed)
Called and spoke with pt. Pt confirmed appt for tomorrow at 3 PM with Dr. Saunders Revel. Pt aware to arrive 15 min early.  ?Pt has no further questions/concerns at this time.  ?

## 2021-10-08 ENCOUNTER — Encounter: Payer: Self-pay | Admitting: Internal Medicine

## 2021-10-08 ENCOUNTER — Ambulatory Visit: Payer: PPO | Admitting: Internal Medicine

## 2021-10-08 VITALS — BP 160/70 | HR 70 | Ht 65.0 in | Wt 187.0 lb

## 2021-10-08 DIAGNOSIS — R079 Chest pain, unspecified: Secondary | ICD-10-CM

## 2021-10-08 DIAGNOSIS — I4892 Unspecified atrial flutter: Secondary | ICD-10-CM

## 2021-10-08 DIAGNOSIS — I10 Essential (primary) hypertension: Secondary | ICD-10-CM | POA: Diagnosis not present

## 2021-10-08 DIAGNOSIS — R0609 Other forms of dyspnea: Secondary | ICD-10-CM

## 2021-10-08 DIAGNOSIS — E782 Mixed hyperlipidemia: Secondary | ICD-10-CM | POA: Diagnosis not present

## 2021-10-08 NOTE — Progress Notes (Signed)
? ?Follow-up Outpatient Visit ?Date: 10/08/2021 ? ?Primary Care Provider: ?Julie Elk, Julie Duncan ?[address] [place] [id number] ? ?Chief Complaint: Palpitations ? ?HPI:  Julie Duncan is a 80 y.o. female with history of atrial fibrillation/flutter, SVT status post ablation, atrial myxoma status post resection (11/9483 at [facility]) complicated by postoperative atrial fibrillation, hypertension, and osteoarthritis, who presents for follow-up of atrial flutter.  She was noted to be in persistent atrial flutter at her visit with Julie Bayley, Julie Duncan, in February.  She subsequently underwent cardioversion in early March and was maintaining sinus rhythm at her follow-up visit with Julie Duncan a week later.  No medication changes were recommended. ? ?Today, Julie Duncan reports that she has been experiencing frequent palpitations since her cardioversion in March.  She describes it as a feeling of her heart pounding more forcefully than she is accustomed to.  It sometimes races with activity though usually it does not seem to be beating faster than normal.  She also gets out of breath more easily, including when walking in from the parking lot today.  She has sporadic chest pains when moving in bed.  She is unable to characterize them further.  She does not have any exertional chest pain.  She notes mild dependent edema in her legs, which is chronic and stable.  She has not had any lightheadedness.  She is compliant with her medications, including apixaban.  Home blood pressures are usually in the 130s to 140s over 70s. ? ?-------------------------------------------------------------------------------------------------- ? ?Past Medical History:  ?Diagnosis Date  ? Atrial flutter ([place])   ? Atrial myxoma   ? a. 2016 s/p resection ([facility]); b. 04/2017 Echo: EF 60-65%, no rwma, mild AI/MR. Mild-mod TR. Nl PASP; c. 07/2021 Echo: EF 60-65%, no rwma, nl RV fxn, mild MR/AI. Ao sclerosis.  ? History of stress test   ?  a. 04/2017 ETT: Poor ex capacity. No ischemia. Episode of PSVT noted.  ? Hyperlipidemia   ? Hypertension   ? Mobitz I   ? Mobitz II   ? Paroxysmal atrial fibrillation (HCC)   ? a. CHA2DS2VASc = 4.  Did not tolerate Warfarin. Difficulty in affording DOAC. Eliquis added 07/2021 (new Aflutter).  ? Paroxysmal SVT (supraventricular tachycardia) ([place])   ? a. 06/2020 Zio: 3813 SVT runs - longest 81m47s, max rate 231; b. 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  ? ?Past Surgical History:  ?Procedure Laterality Date  ? ANKLE SURGERY Left   ? CARDIOVERSION N/A 08/26/2021  ? Procedure: CARDIOVERSION;  Surgeon: ENelva Bush Julie Duncan;  Location: [facility] ORS;  Service: Cardiovascular;  Laterality: N/A;  ? EXCISION OF ATRIAL MYXOMA    ? PARTIAL HYSTERECTOMY    ? SVT ABLATION N/A 07/30/2020  ? Procedure: SVT ABLATION;  Surgeon: TEvans Lance Julie Duncan;  Location[facility];  Service: Cardiovascular;  Laterality: N/A;  ? ? ?Current Meds  ?Medication Sig  ? apixaban (ELIQUIS) 5 MG TABS tablet Take 1 tablet (5 mg total) by mouth 2 (two) times daily.  ? ascorbic acid (VITAMIN C) 500 MG tablet Take 500 mg by mouth daily.  ? Cholecalciferol (VITAMIN D PO) Take by mouth daily.  ? Coenzyme Q10 10 MG capsule Take by mouth daily.  ? cyanocobalamin 50 MCG tablet Take by mouth.  ? Ginkgo Biloba 40 MG TABS Take 1 tablet by mouth daily.  ? losartan (COZAAR) 50 MG tablet Take 1 tablet by mouth twice daily  ? Misc Natural Products (OSTEO BI-FLEX/5-LOXIN  ADVANCED PO)   ? rosuvastatin (CRESTOR) 5 MG tablet Take 5 mg by mouth 2 (two) times daily.  ? VITAMIN E PO Take by mouth daily.  ? ? ?Allergies: Cortisone, Red yeast rice  [cholestin], Saccharin, and Warfarin and related ? ?Social History  ? ?Tobacco Use  ? Smoking status: Never  ? Smokeless tobacco: Never  ?Vaping Use  ? Vaping Use: Never used  ?Substance Use Topics  ? Alcohol use: No  ? Drug use: No  ? ? ?Family History  ?Problem Relation Age of Onset  ? Stroke Mother 36  ? Heart attack Father 58  ? Cancer  Father   ?     throat  ? Cancer Sister 12  ?     lung  ? Cancer Brother   ?     lung  ? Heart attack Brother 51  ? ? ?Review of Systems: ?A 12-system review of systems was performed and was negative except as noted in the HPI. ? ?-------------------------------------------------------------------------------------------------- ? ?Physical Exam: ?BP (!) 160/70 (BP Location: Left Arm, Patient Position: Sitting, Cuff Size: Large)   Pulse 70   Ht '5\' 5"'$  (1.651 m)   Wt 187 lb (84.8 kg)   SpO2 98%   BMI 31.12 kg/m?  ?Repeat BP: 130/60 ? ?General:  NAD. ?Neck: No JVD or HJR. ?Lungs: Clear to auscultation bilaterally without wheezes or crackles. ?Heart: Regular rate and rhythm without murmurs, rubs, or gallops. ?Abdomen: Soft, nontender, nondistended. ?Extremities: Trace pretibial edema. ? ?EKG: Atrial flutter with low voltage and septal infarct.  Ventricular rate 70 bpm.  Compared with prior tracing from 09/03/2021, atrial flutter has replaced sinus rhythm with Mobitz type I second-degree AV block. ? ?Lab Results  ?Component Value Date  ? WBC 7.8 07/29/2021  ? HGB 13.2 07/29/2021  ? HCT 35.8 07/29/2021  ? MCV 93 07/29/2021  ? PLT 317 07/29/2021  ? ? ?Lab Results  ?Component Value Date  ? NA 142 07/29/2021  ? K 4.6 07/29/2021  ? CL 104 07/29/2021  ? CO2 25 07/29/2021  ? BUN 21 07/29/2021  ? CREATININE 1.11 (H) 07/29/2021  ? GLUCOSE 101 (H) 07/29/2021  ? ALT 18 04/14/2017  ? ? ?Lab Results  ?Component Value Date  ? CHOL 257 (H) 06/17/2010  ? HDL 46.60 06/17/2010  ? [place] 179 01/21/2009  ? [place] 179 01/21/2009  ? [place] 179 01/21/2009  ? LDLDIRECT 183.0 06/17/2010  ? TRIG 185.0 (H) 06/17/2010  ? CHOLHDL 6 06/17/2010  ? ? ?-------------------------------------------------------------------------------------------------- ? ?ASSESSMENT AND PLAN: ?Atrial flutter: ?I presume that interval development of palpitations reported by Julie Duncan is related to her recurrent atrial flutter, though her ventricular rate control seems  to be good in the office today off AV nodal blocking agents.  As noted by Julie Duncan in the past, rate control and antiarrhythmic therapy will be challenging given her underlying conduction disease with Mobitz type I second-degree AV block noted on her last tracing in March.  We will defer medication changes today and have her see Julie Duncan as soon as possible for further evaluation.  I have reviewed her tracing with Julie Duncan today, and her best option for long-term management may be AV node ablation and pacemaker placement.  Julie Duncan should continue anticoagulation with apixaban 5 mg twice daily. ? ?Dyspnea on exertion and chest pain: ?DOE most likely related to recurrent atrial flutter, though some element of diastolic dysfunction is likely also present.  Echo earlier this year showed preserved LVEF.  Underlying ischemic  heart disease is also a possibility, though Julie Duncan has not had any frank angina.  Noninvasive ischemia testing would be challenging on account of her atrial flutter and underlying second-degree AV block.  We will defer additional testing for now pending reevaluation by Julie Duncan. ? ?Hypertension: ?Initial blood pressure reading elevated, though repeat reading improved at 130/60.  Continue losartan 50 mg daily. ? ?Hyperlipidemia: ?Continue rosuvastatin 5 mg twice daily for now, as prescribed by her PCP.  Consider repeat lipid panel at Julie Duncan's next blood draw with [place] or her PCP. ? ?Follow-up: Return to clinic in 3 months. ? ?Nelva Bush, Julie Duncan ?10/08/2021 ?3:08 PM ? ?

## 2021-10-08 NOTE — Patient Instructions (Signed)
Medication Instructions:  ? ?Your physician recommends that you continue on your current medications as directed. Please refer to the Current Medication list given to you today. ? ?*If you need a refill on your cardiac medications before your next appointment, please call your pharmacy* ? ? ?Lab Work: ? ?None ordered ? ?Testing/Procedures: ? ?None ordered ? ? ?Follow-Up: ?At [facility], you and your health needs are our priority.  As part of our continuing mission to provide you with exceptional heart care, we have created designated Provider Care Teams.  These Care Teams include your primary Cardiologist (physician) and Advanced Practice Providers (APPs -  Physician Assistants and Nurse Practitioners) who all work together to provide you with the care you need, when you need it. ? ?We recommend signing up for the patient portal called "MyChart".  Sign up information is provided on this After Visit Summary.  MyChart is used to connect with patients for Virtual Visits (Telemedicine).  Patients are able to view lab/test results, encounter notes, upcoming appointments, etc.  Non-urgent messages can be sent to your provider as well.   ?To learn more about what you can do with MyChart, go to [email or web address].   ? ?Your next appointment:   ?3 month(s) ? ?The format for your next appointment:   ?In Person ? ?Provider:   ?You may see Nelva Bush, MD or one of the following Advanced Practice Providers on your designated Care Team:   ?Murray Hodgkins, NP ?Christell Faith, PA-C ?Cadence Kathlen Mody, PA-C1}  ? ? ?Other Instructions ? ?You have been referred to Dr. Quentin Ore with electrophysiology for recurrent atrial flutter.  ? ?Important Information About Sugar ? ? ? ? ? ? ?

## 2021-10-15 ENCOUNTER — Encounter: Payer: Self-pay | Admitting: Cardiology

## 2021-10-15 ENCOUNTER — Ambulatory Visit: Payer: PPO | Admitting: Cardiology

## 2021-10-15 VITALS — BP 154/84 | HR 70 | Ht 66.0 in | Wt 186.0 lb

## 2021-10-15 DIAGNOSIS — I441 Atrioventricular block, second degree: Secondary | ICD-10-CM

## 2021-10-15 DIAGNOSIS — I483 Typical atrial flutter: Secondary | ICD-10-CM

## 2021-10-15 NOTE — Patient Instructions (Signed)
Medications: ?Your physician recommends that you continue on your current medications as directed. Please refer to the Current Medication list given to you today. ?*If you need a refill on your cardiac medications before your next appointment, please call your pharmacy* ? ?Lab Work: ?June 1  ?[facility] at [facility] ?1st desk on the right to check in ?Lab hours: 7:30 am- 5:30 pm (walk in basis) ? ?Testing/Procedures: ?Your physician has requested that you have cardiac CT. Cardiac computed tomography (CT) is a painless test that uses an x-ray machine to take clear, detailed pictures of your heart. For further information please visit [email or web address]. Please follow instruction sheet as given. ? ?Your physician has recommended that you have an ablation. Catheter ablation is a medical procedure used to treat some cardiac arrhythmias (irregular heartbeats). During catheter ablation, a long, thin, flexible tube is put into a blood vessel in your groin (upper thigh), or neck. This tube is called an ablation catheter. It is then guided to your heart through the blood vessel. Radio frequency waves destroy small areas of heart tissue where abnormal heartbeats may cause an arrhythmia to start. Please see the instruction sheet given to you today. ? ?Follow-Up: ?At [facility], you and your health needs are our priority.  As part of our continuing mission to provide you with exceptional heart care, we have created designated Provider Care Teams.  These Care Teams include your primary Cardiologist (physician) and Advanced Practice Providers (APPs -  Physician Assistants and Nurse Practitioners) who all work together to provide you with the care you need, when you need it. ? ?Your physician wants you to follow-up in: I will call you when your Ablation and CT scan are set up with instructions. Ablation date June 22. Office visit 4 weeks after ablation.  ? ?We recommend signing up for the patient portal called  "MyChart".  Sign up information is provided on this After Visit Summary.  MyChart is used to connect with patients for Virtual Visits (Telemedicine).  Patients are able to view lab/test results, encounter notes, upcoming appointments, etc.  Non-urgent messages can be sent to your provider as well.   ?To learn more about what you can do with MyChart, go to [email or web address].   ? ?Any Other Special Instructions Will Be Listed Below (If Applicable).  ? ?Cardiac Ablation ?Cardiac ablation is a procedure to destroy (ablate) some heart tissue that is sending bad signals. These bad signals cause problems in heart rhythm. ?The heart has many areas that make these signals. If there are problems in these areas, they can make the heart beat in a way that is not normal. Destroying some tissues can help make the heart rhythm normal. ?Tell your doctor about: ?Any allergies you have. ?All medicines you are taking. These include vitamins, herbs, eye drops, creams, and over-the-counter medicines. ?Any problems you or family members have had with medicines that make you fall asleep (anesthetics). ?Any blood disorders you have. ?Any surgeries you have had. ?Any medical conditions you have, such as kidney failure. ?Whether you are pregnant or may be pregnant. ?What are the risks? ?This is a safe procedure. But problems may occur, including: ?Infection. ?Bruising and bleeding. ?Bleeding into the chest. ?Stroke or blood clots. ?Damage to nearby areas of your body. ?Allergies to medicines or dyes. ?The need for a pacemaker if the normal system is damaged. ?Failure of the procedure to treat the problem. ?What happens before the procedure? ?Medicines ?Ask your doctor about: ?Changing or stopping your normal  medicines. This is important. ?Taking aspirin and ibuprofen. Do not take these medicines unless your doctor tells you to take them. ?Taking other medicines, vitamins, herbs, and supplements. ?General instructions ?Follow  instructions from your doctor about what you cannot eat or drink. ?Plan to have someone take you home from the hospital or clinic. ?If you will be going home right after the procedure, plan to have someone with you for 24 hours. ?Ask your doctor what steps will be taken to prevent infection. ?What happens during the procedure? ? ?An IV tube will be put into one of your veins. ?You will be given a medicine to help you relax. ?The skin on your neck or groin will be numbed. ?A cut (incision) will be made in your neck or groin. A needle will be put through your cut and into a large vein. ?A tube (catheter) will be put into the needle. The tube will be moved to your heart. ?Dye may be put through the tube. This helps your doctor see your heart. ?Small devices (electrodes) on the tube will send out signals. ?A type of energy will be used to destroy some heart tissue. ?The tube will be taken out. ?Pressure will be held on your cut. This helps stop bleeding. ?A bandage will be put over your cut. ?The exact procedure may vary among doctors and hospitals. ?What happens after the procedure? ?You will be watched until you leave the hospital or clinic. This includes checking your heart rate, breathing rate, oxygen, and blood pressure. ?Your cut will be watched for bleeding. You will need to lie still for a few hours. ?Do not drive for 24 hours or as long as your doctor tells you. ?Summary ?Cardiac ablation is a procedure to destroy some heart tissue. This is done to treat heart rhythm problems. ?Tell your doctor about any medical conditions you may have. Tell him or her about all medicines you are taking to treat them. ?This is a safe procedure. But problems may occur. These include infection, bruising, bleeding, and damage to nearby areas of your body. ?Follow what your doctor tells you about food and drink. You may also be told to change or stop some of your medicines. ?After the procedure, do not drive for 24 hours or as long  as your doctor tells you. ?This information is not intended to replace advice given to you by your health care provider. Make sure you discuss any questions you have with your health care provider. ?Document Revised: 05/11/2019 Document Reviewed: 05/11/2019 ?Elsevier Patient Education ? [place]. ? ?

## 2021-10-15 NOTE — Addendum Note (Signed)
Addended by: Darrell Jewel on: 10/15/2021 09:51 AM ? ? Modules accepted: Orders ? ?

## 2021-10-15 NOTE — Progress Notes (Signed)
?Electrophysiology Office Follow up Visit Note:   ? ?Date:  10/15/2021  ? ?Julie Duncan, DOB Jun 08, 1941, MRN [id number] ? ?PCP:  Marinda Elk, MD  ?[facility] Cardiologist:  Nelva Bush, MD  ?[facility] Electrophysiologist:  Vickie Epley, MD  ? ? ?Interval History:   ? ?Julie Duncan is a 81 y.o. female who presents for a follow up visit.  The patient was previously seen by Dr. Lovena Le and underwent an EP study and ablation on July 30, 2020.  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  At the end of the procedure, the patient's clinical tachycardia was no longer inducible. ? ?Since the procedure, Julie Duncan has had recurrent tachycardia with EKG appearance consistent with typical atrial flutter.  She is on Eliquis twice daily for stroke prophylaxis.  She underwent a cardioversion on August 26, 2021.  She saw Dr. Saunders Revel in follow-up after the cardioversion and again was in atrial flutter with variable AV conduction. ? ?She is symptomatic when in this rhythm with dyspnea on exertion. ? ?  ? ?Past Medical History:  ?Diagnosis Date  ? Atrial flutter ([place])   ? Atrial myxoma   ? a. 2016 s/p resection ([facility]); b. 04/2017 Echo: EF 60-65%, no rwma, mild AI/MR. Mild-mod TR. Nl PASP; c. 07/2021 Echo: EF 60-65%, no rwma, nl RV fxn, mild MR/AI. Ao sclerosis.  ? History of stress test   ? a. 04/2017 ETT: Poor ex capacity. No ischemia. Episode of PSVT noted.  ? Hyperlipidemia   ? Hypertension   ? Mobitz I   ? Mobitz II   ? Paroxysmal atrial fibrillation ([facility])   ? a. CHA2DS2VASc = 4.  Did not tolerate Warfarin. Difficulty in affording DOAC. Eliquis added 07/2021 (new Aflutter).  ? Paroxysmal SVT (supraventricular tachycardia) ([place])   ? a. 06/2020 Zio: 3813 SVT runs - longest 37m47s, max rate 231; b. 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  ? ? ?Past Surgical History:  ?Procedure Laterality Date  ? ANKLE  SURGERY Left   ? CARDIOVERSION N/A 08/26/2021  ? Procedure: CARDIOVERSION;  Surgeon: ENelva Bush MD;  Location: [facility] ORS;  Service: Cardiovascular;  Laterality: N/A;  ? EXCISION OF ATRIAL MYXOMA    ? PARTIAL HYSTERECTOMY    ? SVT ABLATION N/A 07/30/2020  ? Procedure: SVT ABLATION;  Surgeon: TEvans Lance MD;  [facility];  Service: Cardiovascular;  Laterality: N/A;  ? ? ?Current Medications: ?Current Meds  ?Medication Sig  ? apixaban (ELIQUIS) 5 MG TABS tablet Take 1 tablet (5 mg total) by mouth 2 (two) times daily.  ? ascorbic acid (VITAMIN C) 500 MG tablet Take 500 mg by mouth daily.  ? Cholecalciferol (VITAMIN D PO) Take by mouth daily.  ? Coenzyme Q10 10 MG capsule Take by mouth daily.  ? cyanocobalamin 50 MCG tablet Take by mouth daily.  ? Ginkgo Biloba 40 MG TABS Take 1 tablet by mouth daily.  ? losartan (COZAAR) 50 MG tablet Take 1 tablet by mouth twice daily  ? VITAMIN E PO Take by mouth daily.  ?  ? ?Allergies:   Cortisone, Red yeast rice  [cholestin], Saccharin, and Warfarin and related  ? ?Social History  ? ?Socioeconomic History  ? Marital status: Married  ?  Spouse name: Not on file  ? Number of children: Not on file  ? Years of education: Not  on file  ? Highest education level: Not on file  ?Occupational History  ? Not on file  ?Tobacco Use  ? Smoking status: Never  ? Smokeless tobacco: Never  ?Vaping Use  ? Vaping Use: Never used  ?Substance and Sexual Activity  ? Alcohol use: No  ? Drug use: No  ? Sexual activity: Not on file  ?Other Topics Concern  ? Not on file  ?Social History Narrative  ? Lives alone.   ? ?Social Determinants of Health  ? ?Financial Resource Strain: Not on file  ?Food Insecurity: Not on file  ?Transportation Needs: Not on file  ?Physical Activity: Not on file  ?Stress: Not on file  ?Social Connections: Not on file  ?  ? ?Family History: ?The patient's family history includes Cancer in her brother and father; Cancer (age of onset: 65) in her sister; Heart  attack (age of onset: 76) in her brother; Heart attack (age of onset: 56) in her father; Stroke (age of onset: 46) in her mother. ? ?ROS:   ?Please see the history of present illness.    ?All other systems reviewed and are negative. ? ?EKGs/Labs/Other Studies Reviewed:   ? ?The following studies were reviewed today: ? ?EP study records ? ?August 26, 2021 EKG shows sinus rhythm, Wenckebach and first-degree AV delay.  There is low voltage in the precordium. ? ?July 31, 2021 echo ?Left ventricular function normal, 60% ?Right ventricular function normal ?Mild MR ?Mild AI ? ?Holter monitor from 2018 personally reviewed and shows no evidence of atrial fibrillation.  There are multiple episodes of SVT.  Rhythm strips labeled as atrial fibrillation appear to be sinus rhythm with Wenckebach and PVCs. ? ?August 26, 2020 ZIO monitor personally reviewed ?No evidence of atrial fibrillation. ? ? ?EKG:  The ekg ordered today demonstrates typical appearing atrial flutter although very low amplitude in the inferior leads.  The axis of the P wave/flutter wave appears consistent with typical atrial flutter.  Ventricular rate 70 beats a minute. ? ?Recent Labs: ?07/29/2021: BUN 21; Creatinine, Ser 1.11; Hemoglobin 13.2; Magnesium 2.3; Platelets 317; Potassium 4.6; Sodium 142; TSH 1.160  ?Recent Lipid Panel ?   ?Component Value Date/Time  ? CHOL 257 (H) 06/17/2010 1010  ? TRIG 185.0 (H) 06/17/2010 1010  ? HDL 46.60 06/17/2010 1010  ? CHOLHDL 6 06/17/2010 1010  ? VLDL 37.0 06/17/2010 1010  ? [place] 179 01/21/2009 0000  ? [place] 179 01/21/2009 0000  ? [place] 179 01/21/2009 0000  ? LDLDIRECT 183.0 06/17/2010 1010  ? ? ?Physical Exam:   ? ?VS:  BP (!) 154/84 (BP Location: Left Arm, Patient Position: Sitting, Cuff Size: Large)   Pulse 70   Ht '5\' 6"'$  (1.676 m)   Wt 186 lb (84.4 kg)   SpO2 99%   BMI 30.02 kg/m?    ? ?Wt Readings from Last 3 Encounters:  ?10/15/21 186 lb (84.4 kg)  ?10/08/21 187 lb (84.8 kg)  ?09/03/21 182 lb 8 oz (82.8 kg)   ?  ? ?GEN:  Well nourished, well developed in no acute distress ?HEENT: Normal ?NECK: No JVD; No carotid bruits ?LYMPHATICS: No lymphadenopathy ?CARDIAC: RRR, no murmurs, rubs, gallops ?RESPIRATORY:  Clear to auscultation without rales, wheezing or rhonchi  ?ABDOMEN: Soft, non-tender, non-distended ?MUSCULOSKELETAL:  No edema; No deformity  ?SKIN: Warm and dry ?NEUROLOGIC:  Alert and oriented x 3 ?PSYCHIATRIC:  Normal affect  ? ? ? ?  ? ?ASSESSMENT:   ? ?1. Typical atrial flutter ([place])   ?2. Mobitz  type 1 second degree atrioventricular block   ? ?PLAN:   ? ?In order of problems listed above: ? ?#Atrial flutter ?Typical appearing although with her history of atrial myxoma resection, there is a chance this is an atypical flutter around a surgical incision.  Her AV conduction disease makes management of her arrhythmia challenging with limited antiarrhythmic options.  I discussed catheter ablation for atrial flutter in detail during today's visit and she wishes to proceed with scheduling.  I also discussed the risk of developing atrial fibrillation after successful atrial flutter ablation. ? ?I have reviewed every EKG in our system and see no evidence of atrial fibrillation.  I only see evidence of her previously ablated SVT and a recently diagnosed atrial flutter. ? ?Risk, benefits, and alternatives to EP study and radiofrequency ablation for atrial flutter were also discussed in detail today. These risks include but are not limited to stroke, bleeding, vascular damage, tamponade, perforation, damage to the esophagus, lungs, and other structures, pulmonary vein stenosis, worsening renal function, and death. The patient understands these risk and wishes to proceed.  We will therefore proceed with catheter ablation at the next available time.  Carto, ICE, anesthesia are requested for the procedure.  Will also obtain CT PV protocol prior to the procedure to exclude LAA thrombus and further evaluate atrial anatomy given  her history of atrial myxoma resection. ? ? ?Total time spent with patient today 45 minutes. This includes reviewing records, evaluating the patient and coordinating care.  ? ?Medication Adjustments/Labs and Test

## 2021-10-16 ENCOUNTER — Telehealth: Payer: Self-pay | Admitting: Cardiology

## 2021-10-16 NOTE — Addendum Note (Signed)
Addended by: Darrell Jewel on: 10/16/2021 09:37 AM ? ? Modules accepted: Orders ? ?

## 2021-10-16 NOTE — Telephone Encounter (Signed)
Patient is calling requesting to speak with Julie Duncan to cancel her ablation.  ?

## 2021-10-16 NOTE — Telephone Encounter (Signed)
Spoke to the patient and after she went home and thought about having the procedure most she has decided she is not ready to move forward with it at this time. Asked that I cancel the ablation and the CT scan.  I will make Dr. Quentin Ore aware and have him advise of follow time frame.

## 2021-10-16 NOTE — Telephone Encounter (Signed)
Recall placed for 6 months

## 2021-12-11 ENCOUNTER — Ambulatory Visit (HOSPITAL_COMMUNITY): Admit: 2021-12-11 | Payer: PPO | Admitting: Cardiology

## 2021-12-11 ENCOUNTER — Encounter (HOSPITAL_COMMUNITY): Payer: Self-pay

## 2021-12-11 SURGERY — A-FLUTTER ABLATION
Anesthesia: General

## 2021-12-24 ENCOUNTER — Other Ambulatory Visit: Payer: Self-pay | Admitting: Nurse Practitioner

## 2021-12-24 NOTE — Telephone Encounter (Signed)
LVM requesting the patient to call back to verify how she takes the medication. According to Dr. Darnelle Bos note on 10-08-21, he states for the patient to continue to take Losartan '50mg'$  daily. The prescription states for the patient to take 50 mg twice a day.

## 2021-12-24 NOTE — Telephone Encounter (Signed)
Hi Julie Duncan, according to Dr. Darnelle Bos note on 10-08-21, he states for the patient to continue to take Losartan '50mg'$  daily. The prescription states for the patient to take 50 mg twice a day(this is also mentioned on her after visit summary). I spoke with the patient and she states that she takes Losartan 50 mg twice daily. Please advise if it's ok to send med as it is written or 50 mg once daily. Thank you so much.

## 2021-12-26 ENCOUNTER — Other Ambulatory Visit: Payer: Self-pay | Admitting: Nurse Practitioner

## 2021-12-26 ENCOUNTER — Telehealth: Payer: Self-pay | Admitting: Internal Medicine

## 2021-12-26 NOTE — Telephone Encounter (Signed)
*  STAT* If patient is at the pharmacy, call can be transferred to refill team.   1. Which medications need to be refilled? (please list name of each medication and dose if known) losartan (COZAAR) 50 MG tablet  2. Which pharmacy/location (including street and city if local pharmacy) is medication to be sent to? [place], [place]  3. Do they need a 30 day or 90 day supply? 47   Pt states that she called here and told someone that she needed a refill. Informed the pt that pharmacy confirmed it on 12/24/21 at 4:36pm however, she states that she just called them and they have not received it. She states she has enough for the rest of the day, please advise.

## 2021-12-26 NOTE — Telephone Encounter (Signed)
Refill resent to pharmacy as requested.

## 2022-01-07 ENCOUNTER — Ambulatory Visit: Payer: PPO | Admitting: Cardiology

## 2022-01-08 ENCOUNTER — Ambulatory Visit: Payer: PPO | Admitting: Internal Medicine

## 2022-01-08 ENCOUNTER — Other Ambulatory Visit
Admission: RE | Admit: 2022-01-08 | Discharge: 2022-01-08 | Disposition: A | Discharge status: Home / Self Care | Payer: PPO | Source: Ambulatory Visit | Attending: Internal Medicine | Admitting: Internal Medicine

## 2022-01-08 ENCOUNTER — Encounter: Payer: Self-pay | Admitting: Internal Medicine

## 2022-01-08 VITALS — BP 160/60 | HR 72 | Ht 66.0 in | Wt 183.0 lb

## 2022-01-08 DIAGNOSIS — E782 Mixed hyperlipidemia: Secondary | ICD-10-CM | POA: Diagnosis not present

## 2022-01-08 DIAGNOSIS — I10 Essential (primary) hypertension: Secondary | ICD-10-CM

## 2022-01-08 DIAGNOSIS — I483 Typical atrial flutter: Secondary | ICD-10-CM | POA: Diagnosis not present

## 2022-01-08 LAB — BASIC METABOLIC PANEL
Anion gap: 6 (ref 5–15)
BUN: 26 mg/dL — ABNORMAL HIGH (ref 8–23)
CO2: 29 mmol/L (ref 22–32)
Calcium: 9.1 mg/dL (ref 8.9–10.3)
Chloride: 106 mmol/L (ref 98–111)
Creatinine, Ser: 0.93 mg/dL (ref 0.44–1.00)
GFR, Estimated: >60 mL/min (ref >60)
Glucose, Bld: 93 mg/dL (ref 70–99)
Potassium: 4.6 mmol/L (ref 3.5–5.1)
Sodium: 141 mmol/L (ref 135–145)

## 2022-01-08 MED ORDER — HYDROCHLOROTHIAZIDE 12.5 MG PO CAPS: 12.5000 mg | ORAL_CAPSULE | Freq: Every day | ORAL | 3 refills | Status: DC

## 2022-01-08 NOTE — Patient Instructions (Signed)
Medication Instructions:  START Hydrochlorothiazide (HCTZ) 12.5 mg daily   *If you need a refill on your cardiac medications before your next appointment, please call your pharmacy*  Lab Work: Your physician recommends that you return for lab work TODAY: BMP  Your physician recommends that you return for lab work in 1 MONTH:  [place] at [facility] 1st desk on the right to check in (REGISTRATION)  Lab hours: Monday- Friday (7:30 am- 5:30 pm)  If you have labs (blood work) drawn today and your tests are completely normal, you will receive your results only by: MyChart Message (if you have MyChart) OR A paper copy in the mail If you have any lab test that is abnormal or we need to change your treatment, we will call you to review the results.  Testing/Procedures: NONE ordered at this time of appointment    Follow-Up: At [facility], you and your health needs are our priority.  As part of our continuing mission to provide you with exceptional heart care, we have created designated Provider Care Teams.  These Care Teams include your primary Cardiologist (physician) and Advanced Practice Providers (APPs -  Physician Assistants and Nurse Practitioners) who all work together to provide you with the care you need, when you need it.  We recommend signing up for the patient portal called "MyChart".  Sign up information is provided on this After Visit Summary.  MyChart is used to connect with patients for Virtual Visits (Telemedicine).  Patients are able to view lab/test results, encounter notes, upcoming appointments, etc.  Non-urgent messages can be sent to your provider as well.   To learn more about what you can do with MyChart, go to [email or web address].    Your next appointment:   3 month(s)  The format for your next appointment:   In Person  Provider:   You may see Nelva Bush, MD or one of the following Advanced Practice Providers on your designated Care Team:    Murray Hodgkins, NP Christell Faith, PA-C Cadence Kathlen Mody, [place]    Other Instructions   Important Information About Sugar

## 2022-01-08 NOTE — Progress Notes (Signed)
Follow-up Outpatient Visit Date: 01/08/2022  Primary Care Provider: Marinda Elk, [place] [address]  Chief Complaint: Follow-up atrial flutter  HPI:  Ms. Lowder is a 81 y.o. female with history of atrial fibrillation/flutter, SVT status post ablation, atrial myxoma status post resection (11/452 at [facility]) complicated by postoperative atrial fibrillation, hypertension, and osteoarthritis, who presents for follow-up of atrial flutter and heart block.  I last saw her in mid April, at which time she was concerned about frequent palpitations since her cardioversion in March.  She was noted to be back in atrial flutter with ventricular rate of 70 bpm.  She was referred to Dr. Quentin Ore, who recommended EP study and possible ablation.  However, the next day Ms. Stansbury contacted Dr. Quentin Ore and asked to defer any further procedures.  Today, Ms. Storlie feels about the same as at her last visit.  She is asymptomatic at rest but has exertional dyspnea carrying her cat from one room to another.  She also feels like her heart rate speeds up with minimal movements like rolling in bed.  She denies chest pain, lightheadedness, and edema.  She remains on apixaban without bleeding.  She states that she is concerned about undergoing another ablation because a family member had a "heart attack" following an ablation procedure.  --------------------------------------------------------------------------------------------------  Past Medical History:  Diagnosis Date   Atrial flutter ([place])    Atrial myxoma    a. 2016 s/p resection ([facility]); b. 04/2017 Echo: EF 60-65%, no rwma, mild AI/MR. Mild-mod TR. Nl PASP; c. 07/2021 Echo: EF 60-65%, no rwma, nl RV fxn, mild MR/AI. Ao sclerosis.   History of stress test    a. 04/2017 ETT: Poor ex capacity. No ischemia. Episode of PSVT noted.   Hyperlipidemia    Hypertension    Mobitz I    Mobitz II    Paroxysmal atrial  fibrillation (HCC)    a. CHA2DS2VASc = 4.  Did not tolerate Warfarin. Difficulty in affording DOAC. Eliquis added 07/2021 (new Aflutter).   Paroxysmal SVT (supraventricular tachycardia) ([place])    a. 06/2020 Zio: 3813 SVT runs - longest 63m47s, max rate 231; b. 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   Past Surgical History:  Procedure Laterality Date   ANKLE SURGERY Left    CARDIOVERSION N/A 08/26/2021   Procedure: CARDIOVERSION;  Surgeon: ENelva Bush MD;  Location: [facility] ORS;  Service: Cardiovascular;  Laterality: N/A;   EXCISION OF ATRIAL MYXOMA     PARTIAL HYSTERECTOMY     SVT ABLATION N/A 07/30/2020   Procedure: SVT ABLATION;  Surgeon: TEvans Lance MD;  [facility];  Service: Cardiovascular;  Laterality: N/A;    Current Meds  Medication Sig   apixaban (ELIQUIS) 5 MG TABS tablet Take 1 tablet (5 mg total) by mouth 2 (two) times daily.   ascorbic acid (VITAMIN C) 500 MG tablet Take 500 mg by mouth daily.   Cholecalciferol (VITAMIN D PO) Take by mouth daily.   Coenzyme Q10 10 MG capsule Take by mouth daily.   cyanocobalamin 50 MCG tablet Take by mouth daily.   Ginkgo Biloba 40 MG TABS Take 1 tablet by mouth daily.   hydrochlorothiazide (MICROZIDE) 12.5 MG capsule Take 1 capsule (12.5 mg total) by mouth daily.   losartan (COZAAR) 50 MG tablet Take 1 tablet by mouth twice daily   VITAMIN E PO Take by mouth daily.    Allergies: Cortisone, Amlodipine, Red yeast rice  [  cholestin], Saccharin, and Warfarin and related  Social History   Tobacco Use   Smoking status: Never   Smokeless tobacco: Never  Vaping Use   Vaping Use: Never used  Substance Use Topics   Alcohol use: No   Drug use: No    Family History  Problem Relation Age of Onset   Stroke Mother 51   Heart attack Father 28   Cancer Father        throat   Cancer Sister 75       lung   Cancer Brother        lung   Heart attack Brother 11    Review of Systems: A 12-system review of systems was  performed and was negative except as noted in the HPI.  --------------------------------------------------------------------------------------------------  Physical Exam: BP (!) 160/60 (BP Location: Left Arm, Patient Position: Sitting, Cuff Size: Normal)   Pulse 72   Ht '5\' 6"'$  (1.676 m)   Wt 183 lb (83 kg)   SpO2 96%   BMI 29.54 kg/m   General:  NAD. Neck: No JVD or HJR. Lungs: Clear to auscultation bilaterally without wheezes or crackles. Heart: Regular rate and rhythm without murmurs, rubs, or gallops. Abdomen: Soft, nontender, nondistended. Extremities: No lower extremity edema.  EKG:  Atrial flutter with 4:1 AV conducation and left axis deviation.   Nonspecific T wave changes noted.  No significant change since 10/15/2021.  Lab Results  Component Value Date   WBC 7.8 07/29/2021   HGB 13.2 07/29/2021   HCT 35.8 07/29/2021   MCV 93 07/29/2021   PLT 317 07/29/2021    Lab Results  Component Value Date   NA 141 01/08/2022   K 4.6 01/08/2022   CL 106 01/08/2022   CO2 29 01/08/2022   BUN 26 (H) 01/08/2022   CREATININE 0.93 01/08/2022   GLUCOSE 93 01/08/2022   ALT 18 04/14/2017    Lab Results  Component Value Date   CHOL 257 (H) 06/17/2010   HDL 46.60 06/17/2010   LDLCALC 179 01/21/2009   LDLCALC 179 01/21/2009   LDLCALC 179 01/21/2009   LDLDIRECT 183.0 06/17/2010   TRIG 185.0 (H) 06/17/2010   CHOLHDL 6 06/17/2010    --------------------------------------------------------------------------------------------------  ASSESSMENT AND PLAN: Atrial flutter: Ms. Schnoebelen remains in atrial flutter following only brief maintenance of sinus rhythm following cardioversion this spring.  She remains apprehensive about ablation.  Given reasonable rate control at rest and first/second degree AV block when in sinus rhythm, we will defer adding an AV-nodal blocking agent.  I advised her to contact us immediately if her symptoms worsen or if she decides to move forward with ablation  with Dr. Quentin Ore.  Continue apixaban 5 mg BID based on renal function and weight.  Hypertension: BP remains elevated today.  We have agreed to add HCTZ 12.5 mg daily.  I will check a BMP today and again n ~1 month.  Continue losartan 50 mg BID.  Hyperlipidemia: Continue rosuvastatin with ongoing management by her PCP.  Follow-up: Return to clinic in 3 months.  Nelva Bush, MD 01/08/2022 8:01 PM

## 2022-01-12 ENCOUNTER — Telehealth: Payer: Self-pay

## 2022-01-12 DIAGNOSIS — I10 Essential (primary) hypertension: Secondary | ICD-10-CM

## 2022-01-12 NOTE — Telephone Encounter (Signed)
-----   Message from Nelva Bush, MD sent at 01/08/2022 12:37 PM EDT ----- Please let [name] know that her kidney function and electrolytes are stable.  I recommend that she proceed with starting HCTZ 12.5 mg daily with repeat BMP in ~1 month.

## 2022-01-12 NOTE — Telephone Encounter (Signed)
The patient has been notified of the result and verbalized understanding.  All questions (if any) were answered. Kavin Leech, RN 01/12/2022 10:29 AM

## 2022-01-19 DIAGNOSIS — H353131 Nonexudative age-related macular degeneration, bilateral, early dry stage: Secondary | ICD-10-CM | POA: Diagnosis not present

## 2022-02-09 ENCOUNTER — Other Ambulatory Visit
Admission: RE | Admit: 2022-02-09 | Discharge: 2022-02-09 | Disposition: A | Discharge status: Home / Self Care | Payer: PPO | Attending: Internal Medicine | Admitting: Internal Medicine

## 2022-02-09 DIAGNOSIS — I10 Essential (primary) hypertension: Secondary | ICD-10-CM | POA: Insufficient documentation

## 2022-02-09 LAB — BASIC METABOLIC PANEL
Anion gap: 7 (ref 5–15)
BUN: 29 mg/dL — ABNORMAL HIGH (ref 8–23)
CO2: 29 mmol/L (ref 22–32)
Calcium: 9.1 mg/dL (ref 8.9–10.3)
Chloride: 106 mmol/L (ref 98–111)
Creatinine, Ser: 0.94 mg/dL (ref 0.44–1.00)
GFR, Estimated: >60 mL/min (ref >60)
Glucose, Bld: 99 mg/dL (ref 70–99)
Potassium: 4.2 mmol/L (ref 3.5–5.1)
Sodium: 142 mmol/L (ref 135–145)

## 2022-02-10 ENCOUNTER — Telehealth: Payer: Self-pay | Admitting: *Deleted

## 2022-02-10 NOTE — Telephone Encounter (Signed)
-----   Message from Nelva Bush, MD sent at 02/09/2022  5:36 PM EDT ----- Please let Ms. Lawhead know that her labs are stable.  She should continue her current medications and follow-up as discussed at our last visit.

## 2022-02-10 NOTE — Telephone Encounter (Signed)
Attempted to call pt. No answer. Lmtcb.  

## 2022-02-11 NOTE — Telephone Encounter (Signed)
Lab results given to pt via detailed message on voicemail. Per DPR ok to LDM. Asked pt to call back with any further questions.

## 2022-03-05 ENCOUNTER — Telehealth: Payer: Self-pay | Admitting: Internal Medicine

## 2022-03-05 NOTE — Telephone Encounter (Signed)
Pt c/o Shortness Of Breath: STAT if SOB developed within the last 24 hours or pt is noticeably SOB on the phone  1. Are you currently SOB (can you hear that pt is SOB on the phone)? no  2. How long have you been experiencing SOB? For a while   3. Are you SOB when sitting or when up moving around? both  4. Are you currently experiencing any other symptoms? And chest beats hard.  Made appt on 9/21 for Dr. Saunders Revel

## 2022-03-05 NOTE — Telephone Encounter (Signed)
Pt called to report that she has still been struggling with dyspnea on exertion. She is okay at rest but with minimal exertion such as cooking, cleaning, her ADL's she has to stop and rest... she denies dizziness, chest pain, palpitations, but says she feels her heart beating "hard".   She can lay flat to sleep at night but her hearts pound when laying on either side and keeps her from sleeping.   She denies any peripheral edema and feels she has been hydrating well.   She does not know her BP and HR.   We talked about of she has any worsening symptoms and if anything changes she will consider the ED if needed prior to being seen.   She has an appt with Dr End 03/12/22 but will forward to him for review if any recommendations prior to her visit.

## 2022-03-06 ENCOUNTER — Ambulatory Visit: Payer: PPO | Admitting: Internal Medicine

## 2022-03-06 NOTE — Telephone Encounter (Signed)
Says [employer] customer not available, will try again later

## 2022-03-06 NOTE — Telephone Encounter (Signed)
Julie Duncan mentioned similar symptoms at her last visit with me.  She was previously seen by Dr. Quentin Ore who planned to perform atrial flutter ablation.  However, Julie Duncan subsequently cancelled the procedure.  We will follow-up as scheduled next week to reassess her symptoms and refer her back to Dr. Quentin Ore if she wishes to proceed with ablation at that time.  Julie Bush, MD [facility]

## 2022-03-06 NOTE — Telephone Encounter (Signed)
I spoke with patient.She agrees to discuss possible referral to Dr.Lambert at her visit next week with dr.end. She offered no complaints today.

## 2022-03-12 ENCOUNTER — Encounter: Payer: Self-pay | Admitting: Internal Medicine

## 2022-03-12 ENCOUNTER — Ambulatory Visit: Payer: PPO | Attending: Internal Medicine | Admitting: Internal Medicine

## 2022-03-12 VITALS — BP 150/60 | HR 73 | Ht 64.0 in | Wt 184.0 lb

## 2022-03-12 DIAGNOSIS — I10 Essential (primary) hypertension: Secondary | ICD-10-CM | POA: Diagnosis not present

## 2022-03-12 DIAGNOSIS — I4892 Unspecified atrial flutter: Secondary | ICD-10-CM | POA: Diagnosis not present

## 2022-03-12 DIAGNOSIS — E782 Mixed hyperlipidemia: Secondary | ICD-10-CM | POA: Diagnosis not present

## 2022-03-12 DIAGNOSIS — I441 Atrioventricular block, second degree: Secondary | ICD-10-CM | POA: Diagnosis not present

## 2022-03-12 MED ORDER — HYDROCHLOROTHIAZIDE 25 MG PO TABS: 25.0000 mg | ORAL_TABLET | Freq: Every day | ORAL | 3 refills | Status: DC

## 2022-03-12 MED ORDER — APIXABAN 5 MG PO TABS: 5.0000 mg | ORAL_TABLET | Freq: Two times a day (BID) | ORAL | 3 refills | Status: DC

## 2022-03-12 NOTE — Patient Instructions (Addendum)
Medication Instructions:  INCREASE: Hydrochlorothiazide to 25 mg daily  *If you need a refill on your cardiac medications before your next appointment, please call your pharmacy*   Lab Work: Your physician recommends that you return for lab work in 1 month ( [place]) at [facility]  If you have labs (blood work) drawn today and your tests are completely normal, you will receive your results only by: [place] (if you have MyChart) OR A paper copy in the mail If you have any lab test that is abnormal or we need to change your treatment, we will call you to review the results.   Testing/Procedures: None ordered today   Follow-Up: At [facility], you and your health needs are our priority.  As part of our continuing mission to provide you with exceptional heart care, we have created designated Provider Care Teams.  These Care Teams include your primary Cardiologist (physician) and Advanced Practice Providers (APPs -  Physician Assistants and Nurse Practitioners) who all work together to provide you with the care you need, when you need it.  We recommend signing up for the patient portal called "MyChart".  Sign up information is provided on this After Visit Summary.  MyChart is used to connect with patients for Virtual Visits (Telemedicine).  Patients are able to view lab/test results, encounter notes, upcoming appointments, etc.  Non-urgent messages can be sent to your provider as well.   To learn more about what you can do with MyChart, go to [email or web address].    Your next appointment:   3 month(s)  The format for your next appointment:   In Person  Provider:   Nelva Bush, MD    Your physician recommends that you schedule a follow-up appointment 1-2 weeks with Dr. Quentin Ore

## 2022-03-12 NOTE — Progress Notes (Signed)
Follow-up Outpatient Visit Date: 03/12/2022  Primary Care Provider: Marinda Elk, [place] [address]  Chief Complaint: Shortness of breath and palpitations  HPI:  Julie Duncan is a 81 y.o. female with history of  atrial fibrillation/flutter, SVT status post ablation, Mobitz type I second-degree AV block, atrial myxoma status post resection (10/35 at [facility]) complicated by postoperative atrial fibrillation, hypertension, and osteoarthritis, who presents for follow-up of atrial flutter and heart block.  I last saw her in July, at which time Ms. Ezekiel continued to complain of exertional dyspnea with minimal activity and associated palpitations.  She remained in atrial flutter with 4:1 AV conduction, having only maintained sinus rhythm for a short period after cardioversion this spring.  She was apprehensive about EP study and ablation previously recommended by Dr. Quentin Ore.  Due to elevated blood pressure at our last visit, we added HCTZ 12.5 mg daily.  She reached out to [place] a week ago complaining of ongoing exertional dyspnea and her heart "beating hard."  Today, Ms. Buzzelli reports that she continues to feel poorly.  She has exertional dyspnea with minimal activity such as walking through her house.  This has made her almost completely homebound.  She complains about worsening back pain because she spends most of her days sitting.  She used to be very active.  She also feels her heart pounding frequently, especially when she is lying down.  She denies chest pain, lightheadedness, and edema.  On further questioning, she reports that she lost her bottle of apixaban and has instead been taking aspirin twice daily for at least a few weeks, if not longer.  --------------------------------------------------------------------------------------------------  Past Medical History:  Diagnosis Date   Atrial flutter ([place])    Atrial myxoma    a. 2016 s/p resection  ([facility]); b. 04/2017 Echo: EF 60-65%, no rwma, mild AI/MR. Mild-mod TR. Nl PASP; c. 07/2021 Echo: EF 60-65%, no rwma, nl RV fxn, mild MR/AI. Ao sclerosis.   History of stress test    a. 04/2017 ETT: Poor ex capacity. No ischemia. Episode of PSVT noted.   Hyperlipidemia    Hypertension    Mobitz I    Mobitz II    Paroxysmal atrial fibrillation (HCC)    a. CHA2DS2VASc = 4.  Did not tolerate Warfarin. Difficulty in affording DOAC. Eliquis added 07/2021 (new Aflutter).   Paroxysmal SVT (supraventricular tachycardia) ([place])    a. 06/2020 Zio: 3813 SVT runs - longest 75m47s, max rate 231; b. 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   Past Surgical History:  Procedure Laterality Date   ANKLE SURGERY Left    CARDIOVERSION N/A 08/26/2021   Procedure: CARDIOVERSION;  Surgeon: ENelva Bush MD;  Location: [facility] ORS;  Service: Cardiovascular;  Laterality: N/A;   EXCISION OF ATRIAL MYXOMA     PARTIAL HYSTERECTOMY     SVT ABLATION N/A 07/30/2020   Procedure: SVT ABLATION;  Surgeon: TEvans Lance MD;  [facility];  Service: Cardiovascular;  Laterality: N/A;    Current Meds  Medication Sig   apixaban (ELIQUIS) 5 MG TABS tablet Take 1 tablet (5 mg total) by mouth 2 (two) times daily.   ascorbic acid (VITAMIN C) 500 MG tablet Take 500 mg by mouth daily.   Cholecalciferol (VITAMIN D PO) Take by mouth daily.   Coenzyme Q10 10 MG capsule Take by mouth daily.   cyanocobalamin 50 MCG tablet Take by mouth daily.   Ginkgo Biloba 40  MG TABS Take 1 tablet by mouth daily.   hydrochlorothiazide (MICROZIDE) 12.5 MG capsule Take 1 capsule (12.5 mg total) by mouth daily.   losartan (COZAAR) 50 MG tablet Take 1 tablet by mouth twice daily   OVER THE COUNTER MEDICATION Take 2 tablets by mouth daily. Total beets   VITAMIN E PO Take by mouth daily.    Allergies: Cortisone, Amlodipine, Red yeast rice  [cholestin], Saccharin, and Warfarin and related  Social History   Tobacco Use   Smoking status: Never    Smokeless tobacco: Never  Vaping Use   Vaping Use: Never used  Substance Use Topics   Alcohol use: No   Drug use: No    Family History  Problem Relation Age of Onset   Stroke Mother 70   Heart attack Father 32   Cancer Father        throat   Cancer Sister 33       lung   Cancer Brother        lung   Heart attack Brother 38    Review of Systems: A 12-system review of systems was performed and was negative except as noted in the HPI.  --------------------------------------------------------------------------------------------------  Physical Exam: BP (!) 150/60 (BP Location: Left Arm, Patient Position: Sitting, Cuff Size: Large)   Pulse 73   Ht '5\' 4"'$  (1.626 m)   Wt 184 lb (83.5 kg)   SpO2 98%   BMI 31.58 kg/m   General:  NAD. Neck: No JVD or HJR. Lungs: Clear to auscultation bilaterally without wheezes or crackles. Heart: Regular rate and rhythm with 2/6 systolic murmur.  No rubs or gallops. Abdomen: Soft, nontender, nondistended. Extremities: No lower extremity edema.  EKG: Atrial flutter with 4:1 AV conduction, left axis deviation, and nonspecific ST/T changes.  No significant change from prior tracing on 01/08/2022.  Lab Results  Component Value Date   WBC 7.8 07/29/2021   HGB 13.2 07/29/2021   HCT 35.8 07/29/2021   MCV 93 07/29/2021   PLT 317 07/29/2021    Lab Results  Component Value Date   NA 142 02/09/2022   K 4.2 02/09/2022   CL 106 02/09/2022   CO2 29 02/09/2022   BUN 29 (H) 02/09/2022   CREATININE 0.94 02/09/2022   GLUCOSE 99 02/09/2022   ALT 18 04/14/2017    Lab Results  Component Value Date   CHOL 257 (H) 06/17/2010   HDL 46.60 06/17/2010   LDLCALC 179 01/21/2009   LDLCALC 179 01/21/2009   LDLCALC 179 01/21/2009   LDLDIRECT 183.0 06/17/2010   TRIG 185.0 (H) 06/17/2010   CHOLHDL 6 06/17/2010    --------------------------------------------------------------------------------------------------  ASSESSMENT AND PLAN: Atrial flutter  and Mobitz type I second-degree AV block: Ms. Cyphers continues to have significant functional limitations due to fatigue and shortness of breath.  I believe this is primarily due to her atrial flutter.  Echocardiogram in February showed normal LVEF with mild mitral and aortic regurgitation.  It is possible that her dyspnea could be an anginal equivalent, though proving this would likely require cardiac catheterization as history of second-degree AV block and persistent atrial flutter preclude myocardial perfusion stress testing and coronary CTA.  Ms. Saperstein is reluctant to proceed with this.  I will refer Ms. Kestler back to Dr. Quentin Ore for further discussion of EP study and flutter ablation, which she is now willing to proceed with.  We discussed the importance of ongoing anticoagulation in the setting of a CHA2DS2-VASc score of at least 4.  I  will have her restart apixaban 5 mg twice daily.  Hypertension: Blood pressure mildly elevated today.  We will increase HCTZ to 25 mg daily.  Continue losartan 50 mg twice daily.  We will plan to recheck a BMP in about a month.  Hyperlipidemia: LDL mildly elevated on last check in 04/2021 at 135.  Continue rosuvastatin at the direction of Dr. Carrie Mew.  If LDL remains above 100, dose escalation should be considered at her next visit with Dr. Carrie Mew.  Follow-up: Return to clinic in 3 months.  Nelva Bush, MD 03/12/2022 11:04 AM

## 2022-03-24 DIAGNOSIS — H2513 Age-related nuclear cataract, bilateral: Secondary | ICD-10-CM | POA: Diagnosis not present

## 2022-03-24 DIAGNOSIS — H2512 Age-related nuclear cataract, left eye: Secondary | ICD-10-CM | POA: Diagnosis not present

## 2022-03-24 DIAGNOSIS — H353132 Nonexudative age-related macular degeneration, bilateral, intermediate dry stage: Secondary | ICD-10-CM | POA: Diagnosis not present

## 2022-03-24 DIAGNOSIS — H18413 Arcus senilis, bilateral: Secondary | ICD-10-CM | POA: Diagnosis not present

## 2022-03-24 DIAGNOSIS — H25013 Cortical age-related cataract, bilateral: Secondary | ICD-10-CM | POA: Diagnosis not present

## 2022-03-24 NOTE — H&P (View-Only) (Signed)
Electrophysiology Office Follow up Visit Note:    Date:  03/25/2022   ID:  Julie Duncan, DOB [birth year], MRN [id number]  PCP:  Marinda Elk, MD  [facility] Cardiologist:  Nelva Bush, MD  [facility] Electrophysiologist:  Vickie Epley, MD    Interval History:    Julie Duncan is a 81 y.o. female who presents for a follow up visit. They were last seen in clinic October 15, 2021 for atrial flutter.  The patient was previously seen by Dr. Lovena Le and had an EP study and ablation on July 30, 2020.  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  After that procedure she had recurrence of arrhythmia with an EKG consistent with typical atrial flutter.  At her last appointment, EP study and ablation of typical atrial flutter was recommended.  From reviewing the records, the procedure was scheduled for December 11, 2021 but the patient ultimately canceled.   The patient saw Dr. Saunders Revel January 08, 2022 for follow-up.  At that appointment she reported tachycardia with minimal exertion.  Given her history of second-degree AV block, her medications were not uptitrated.  After that appointment she sent a message via MyChart reporting shortness of breath and feel like her heart was "pounding".  She was scheduled for follow-up with me to discuss alternative treatment strategies.  She saw Dr. Saunders Revel in follow-up again on March 12, 2022.  At that appointment she again discussed her rapid heart rates and dyspnea with exertion.  She was more interested in pursuing catheter ablation at that appointment.     Past Medical History:  Diagnosis Date   Atrial flutter ([place])    Atrial myxoma    a. 2016 s/p resection ([facility]); b. 04/2017 Echo: EF 60-65%, no rwma, mild AI/MR. Mild-mod TR. Nl PASP; c. 07/2021 Echo: EF 60-65%, no rwma, nl RV fxn, mild MR/AI. Ao sclerosis.   History of stress test    a. 04/2017 ETT: Poor ex capacity. No ischemia. Episode of PSVT noted.   Hyperlipidemia     Hypertension    Mobitz I    Mobitz II    Paroxysmal atrial fibrillation (HCC)    a. CHA2DS2VASc = 4.  Did not tolerate Warfarin. Difficulty in affording DOAC. Eliquis added 07/2021 (new Aflutter).   Paroxysmal SVT (supraventricular tachycardia)    a. 06/2020 Zio: 3813 SVT runs - longest 4m47s, max rate 231; b. 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    Past Surgical History:  Procedure Laterality Date   ANKLE SURGERY Left    CARDIOVERSION N/A 08/26/2021   Procedure: CARDIOVERSION;  Surgeon: ENelva Bush MD;  Location: [facility] ORS;  Service: Cardiovascular;  Laterality: N/A;   EXCISION OF ATRIAL MYXOMA     PARTIAL HYSTERECTOMY     SVT ABLATION N/A 07/30/2020   Procedure: SVT ABLATION;  Surgeon: TEvans Lance MD;  [facility];  Service: Cardiovascular;  Laterality: N/A;    Current Medications: Current Meds  Medication Sig   apixaban (ELIQUIS) 5 MG TABS tablet Take 1 tablet (5 mg total) by mouth 2 (two) times daily.   ascorbic acid (VITAMIN C) 500 MG tablet Take 500 mg by mouth daily.   Cholecalciferol (VITAMIN D PO) Take by mouth daily.   Coenzyme Q10 10 MG capsule Take by mouth daily.   cyanocobalamin 50 MCG tablet Take by mouth daily.   Ginkgo Biloba 40 MG TABS Take 1 tablet by mouth daily.   hydrochlorothiazide (HYDRODIURIL) 25  MG tablet Take 1 tablet (25 mg total) by mouth daily.   losartan (COZAAR) 50 MG tablet Take 1 tablet by mouth twice daily   OVER THE COUNTER MEDICATION Take 2 tablets by mouth daily. Total beets   VITAMIN E PO Take by mouth daily.     Allergies:   Cortisone, Amlodipine, Red yeast rice  [cholestin], Saccharin, and Warfarin and related   Social History   Socioeconomic History   Marital status: Married    Spouse name: Not on file   Number of children: Not on file   Years of education: Not on file   Highest education level: Not on file  Occupational History   Not on file  Tobacco Use   Smoking status: Never   Smokeless tobacco: Never   Vaping Use   Vaping Use: Never used  Substance and Sexual Activity   Alcohol use: No   Drug use: No   Sexual activity: Not on file  Other Topics Concern   Not on file  Social History Narrative   Lives alone.    Social Determinants of Health   Financial Resource Strain: Not on file  Food Insecurity: Not on file  Transportation Needs: Not on file  Physical Activity: Not on file  Stress: Not on file  Social Connections: Not on file     Family History: The patient's family history includes Cancer in her brother and father; Cancer (age of onset: 70) in her sister; Heart attack (age of onset: 25) in her brother; Heart attack (age of onset: 75) in her father; Stroke (age of onset: 70) in her mother.  ROS:   Please see the history of present illness.    All other systems reviewed and are negative.  EKGs/Labs/Other Studies Reviewed:    The following studies were reviewed today:  March 12, 2022 EKG shows typical appearing atrial flutter    Recent Labs: 07/29/2021: Hemoglobin 13.2; Magnesium 2.3; Platelets 317; TSH 1.160 02/09/2022: BUN 29; Creatinine, Ser 0.94; Potassium 4.2; Sodium 142  Recent Lipid Panel    Component Value Date/Time   CHOL 257 (H) 06/17/2010 1010   TRIG 185.0 (H) 06/17/2010 1010   HDL 46.60 06/17/2010 1010   CHOLHDL 6 06/17/2010 1010   VLDL 37.0 06/17/2010 1010   LDLCALC 179 01/21/2009 0000   LDLCALC 179 01/21/2009 0000   LDLCALC 179 01/21/2009 0000   LDLDIRECT 183.0 06/17/2010 1010    Physical Exam:    VS:  BP (!) 138/52   Pulse 72   Ht '5\' 4"'$  (1.626 m)   Wt 183 lb (83 kg)   SpO2 98%   BMI 31.41 kg/m     Wt Readings from Last 3 Encounters:  03/25/22 183 lb (83 kg)  03/12/22 184 lb (83.5 kg)  01/08/22 183 lb (83 kg)     GEN:  Well nourished, well developed in no acute distress. Elderly. HEENT: Normal NECK: No JVD; No carotid bruits LYMPHATICS: No lymphadenopathy CARDIAC: RRR, no murmurs, rubs, gallops RESPIRATORY:  Clear to  auscultation without rales, wheezing or rhonchi  ABDOMEN: Soft, non-tender, non-distended MUSCULOSKELETAL:  No edema; No deformity  SKIN: Warm and dry NEUROLOGIC:  Alert and oriented x 3 PSYCHIATRIC:  Normal affect        ASSESSMENT:    1. Typical atrial flutter ([place])   2. AV block    PLAN:    In order of problems listed above:  #Atrial flutter Typical appearing although she has a history of atrial myxoma resection raising the possibility  of a incisional flutter.  Her history of conduction system disease limits her ability to use antiarrhythmic drugs.  I have previously discussed catheter ablation for atrial flutter in detail with the patient.  We discussed the procedural details, the risks and possibility that she would go on to develop atrial fibrillation in the future.    Discussed treatment options today for her atrial arrhythmia including antiarrhythmic drug therapy and ablation. Discussed risks, recovery and likelihood of success. Discussed potential need for repeat ablation procedures and antiarrhythmic drugs after an initial ablation. They wish to proceed with scheduling.  Risk, benefits, and alternatives to EP study and radiofrequency ablation for afib and flutter were also discussed in detail today. These risks include but are not limited to stroke, bleeding, vascular damage, tamponade, perforation, damage to the esophagus, lungs, and other structures, pulmonary vein stenosis, worsening renal function, and death. The patient understands these risk and wishes to proceed.  We will therefore proceed with catheter ablation at the next available time.  Carto, ICE, anesthesia are requested for the procedure.  Will also obtain CT PV protocol prior to the procedure to exclude LAA thrombus and further evaluate atrial anatomy.  I do think it is important for [place] to rule out significant coronary artery disease prior to catheter ablation.  I discussed coronary angiography in detail with the  patient including risks and she is willing to proceed prior to catheter ablation.  Procedural plan will be to ablate her CTI and then check for inducible atypical flutters of AF. If AF is observed during the procedure, I will perform a PVI. We will get a CT prior to the procedure.  #Dyspnea with exertion Concerning for an anginal equivalent. Will get a LHC with Dr End prior the ablation procedure.   Total time spent with patient today 45 minutes. This includes reviewing records, evaluating the patient and coordinating care.   Medication Adjustments/Labs and Tests Ordered: Current medicines are reviewed at length with the patient today.  Concerns regarding medicines are outlined above.  No orders of the defined types were placed in this encounter.  No orders of the defined types were placed in this encounter.    Signed, Lars Mage, MD, [facility], [facility] 03/25/2022 9:14 AM    Electrophysiology [facility]

## 2022-03-24 NOTE — Progress Notes (Unsigned)
Electrophysiology Office Follow up Visit Note:    Date:  03/25/2022   ID:  Julie Duncan, DOB 1940-12-24, MRN [id number]  PCP:  Julie Elk, MD  [facility] Cardiologist:  Julie Bush, MD  [facility] Electrophysiologist:  Julie Epley, MD    Interval History:    Julie Duncan is a 81 y.o. female who presents for a follow up visit. They were last seen in clinic October 15, 2021 for atrial flutter.  The patient was previously seen by Dr. Lovena Duncan and had an EP study and ablation on July 30, 2020.  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  After that procedure she had recurrence of arrhythmia with an EKG consistent with typical atrial flutter.  At her last appointment, EP study and ablation of typical atrial flutter was recommended.  From reviewing the records, the procedure was scheduled for December 11, 2021 but the patient ultimately canceled.   The patient saw Dr. Saunders Revel January 08, 2022 for follow-up.  At that appointment she reported tachycardia with minimal exertion.  Given her history of second-degree AV block, her medications were not uptitrated.  After that appointment she sent a message via MyChart reporting shortness of breath and feel like her heart was "pounding".  She was scheduled for follow-up with me to discuss alternative treatment strategies.  She saw Dr. Saunders Revel in follow-up again on March 12, 2022.  At that appointment she again discussed her rapid heart rates and dyspnea with exertion.  She was more interested in pursuing catheter ablation at that appointment.     Past Medical History:  Diagnosis Date   Atrial flutter ([place])    Atrial myxoma    a. 2016 s/p resection ([facility]); b. 04/2017 Echo: EF 60-65%, no rwma, mild AI/MR. Mild-mod TR. Nl PASP; c. 07/2021 Echo: EF 60-65%, no rwma, nl RV fxn, mild MR/AI. Ao sclerosis.   History of stress test    a. 04/2017 ETT: Poor ex capacity. No ischemia. Episode of PSVT noted.   Hyperlipidemia     Hypertension    Mobitz I    Mobitz II    Paroxysmal atrial fibrillation (HCC)    a. CHA2DS2VASc = 4.  Did not tolerate Warfarin. Difficulty in affording DOAC. Eliquis added 07/2021 (new Aflutter).   Paroxysmal SVT (supraventricular tachycardia)    a. 06/2020 Zio: 3813 SVT runs - longest 39m47s, max rate 231; b. 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    Past Surgical History:  Procedure Laterality Date   ANKLE SURGERY Left    CARDIOVERSION N/A 08/26/2021   Procedure: CARDIOVERSION;  Surgeon: ENelva Bush MD;  Location: [facility] ORS;  Service: Cardiovascular;  Laterality: N/A;   EXCISION OF ATRIAL MYXOMA     PARTIAL HYSTERECTOMY     SVT ABLATION N/A 07/30/2020   Procedure: SVT ABLATION;  Surgeon: TEvans Lance MD;  [facility];  Service: Cardiovascular;  Laterality: N/A;    Current Medications: Current Meds  Medication Sig   apixaban (ELIQUIS) 5 MG TABS tablet Take 1 tablet (5 mg total) by mouth 2 (two) times daily.   ascorbic acid (VITAMIN C) 500 MG tablet Take 500 mg by mouth daily.   Cholecalciferol (VITAMIN D PO) Take by mouth daily.   Coenzyme Q10 10 MG capsule Take by mouth daily.   cyanocobalamin 50 MCG tablet Take by mouth daily.   Ginkgo Biloba 40 MG TABS Take 1 tablet by mouth daily.   hydrochlorothiazide (HYDRODIURIL) 25  MG tablet Take 1 tablet (25 mg total) by mouth daily.   losartan (COZAAR) 50 MG tablet Take 1 tablet by mouth twice daily   OVER THE COUNTER MEDICATION Take 2 tablets by mouth daily. Total beets   VITAMIN E PO Take by mouth daily.     Allergies:   Cortisone, Amlodipine, Red yeast rice  [cholestin], Saccharin, and Warfarin and related   Social History   Socioeconomic History   Marital status: Married    Spouse name: Not on file   Number of children: Not on file   Years of education: Not on file   Highest education level: Not on file  Occupational History   Not on file  Tobacco Use   Smoking status: Never   Smokeless tobacco: Never   Vaping Use   Vaping Use: Never used  Substance and Sexual Activity   Alcohol use: No   Drug use: No   Sexual activity: Not on file  Other Topics Concern   Not on file  Social History Narrative   Lives alone.    Social Determinants of Health   Financial Resource Strain: Not on file  Food Insecurity: Not on file  Transportation Needs: Not on file  Physical Activity: Not on file  Stress: Not on file  Social Connections: Not on file     Family History: The patient's family history includes Cancer in her brother and father; Cancer (age of onset: 45) in her sister; Heart attack (age of onset: 75) in her brother; Heart attack (age of onset: 66) in her father; Stroke (age of onset: 30) in her mother.  ROS:   Please see the history of present illness.    All other systems reviewed and are negative.  EKGs/Labs/Other Studies Reviewed:    The following studies were reviewed today:  March 12, 2022 EKG shows typical appearing atrial flutter    Recent Labs: 07/29/2021: Hemoglobin 13.2; Magnesium 2.3; Platelets 317; TSH 1.160 02/09/2022: BUN 29; Creatinine, Ser 0.94; Potassium 4.2; Sodium 142  Recent Lipid Panel    Component Value Date/Time   CHOL 257 (H) 06/17/2010 1010   TRIG 185.0 (H) 06/17/2010 1010   HDL 46.60 06/17/2010 1010   CHOLHDL 6 06/17/2010 1010   VLDL 37.0 06/17/2010 1010   LDLCALC 179 01/21/2009 0000   LDLCALC 179 01/21/2009 0000   LDLCALC 179 01/21/2009 0000   LDLDIRECT 183.0 06/17/2010 1010    Physical Exam:    VS:  BP (!) 138/52   Pulse 72   Ht '5\' 4"'$  (1.626 m)   Wt 183 lb (83 kg)   SpO2 98%   BMI 31.41 kg/m     Wt Readings from Last 3 Encounters:  03/25/22 183 lb (83 kg)  03/12/22 184 lb (83.5 kg)  01/08/22 183 lb (83 kg)     GEN:  Well nourished, well developed in no acute distress. Elderly. HEENT: Normal NECK: No JVD; No carotid bruits LYMPHATICS: No lymphadenopathy CARDIAC: RRR, no murmurs, rubs, gallops RESPIRATORY:  Clear to  auscultation without rales, wheezing or rhonchi  ABDOMEN: Soft, non-tender, non-distended MUSCULOSKELETAL:  No edema; No deformity  SKIN: Warm and dry NEUROLOGIC:  Alert and oriented x 3 PSYCHIATRIC:  Normal affect        ASSESSMENT:    1. Typical atrial flutter ([place])   2. AV block    PLAN:    In order of problems listed above:  #Atrial flutter Typical appearing although she has a history of atrial myxoma resection raising the possibility  of a incisional flutter.  Her history of conduction system disease limits her ability to use antiarrhythmic drugs.  I have previously discussed catheter ablation for atrial flutter in detail with the patient.  We discussed the procedural details, the risks and possibility that she would go on to develop atrial fibrillation in the future.    Discussed treatment options today for her atrial arrhythmia including antiarrhythmic drug therapy and ablation. Discussed risks, recovery and likelihood of success. Discussed potential need for repeat ablation procedures and antiarrhythmic drugs after an initial ablation. They wish to proceed with scheduling.  Risk, benefits, and alternatives to EP study and radiofrequency ablation for afib and flutter were also discussed in detail today. These risks include but are not limited to stroke, bleeding, vascular damage, tamponade, perforation, damage to the esophagus, lungs, and other structures, pulmonary vein stenosis, worsening renal function, and death. The patient understands these risk and wishes to proceed.  We will therefore proceed with catheter ablation at the next available time.  Carto, ICE, anesthesia are requested for the procedure.  Will also obtain CT PV protocol prior to the procedure to exclude LAA thrombus and further evaluate atrial anatomy.  I do think it is important for [place] to rule out significant coronary artery disease prior to catheter ablation.  I discussed coronary angiography in detail with the  patient including risks and she is willing to proceed prior to catheter ablation.  Procedural plan will be to ablate her CTI and then check for inducible atypical flutters of AF. If AF is observed during the procedure, I will perform a PVI. We will get a CT prior to the procedure.  #Dyspnea with exertion Concerning for an anginal equivalent. Will get a LHC with Dr End prior the ablation procedure.   Total time spent with patient today 45 minutes. This includes reviewing records, evaluating the patient and coordinating care.   Medication Adjustments/Labs and Tests Ordered: Current medicines are reviewed at length with the patient today.  Concerns regarding medicines are outlined above.  No orders of the defined types were placed in this encounter.  No orders of the defined types were placed in this encounter.    Signed, Lars Mage, MD, [facility], [facility] 03/25/2022 9:14 AM    Electrophysiology [facility]

## 2022-03-25 ENCOUNTER — Other Ambulatory Visit
Admission: RE | Admit: 2022-03-25 | Discharge: 2022-03-25 | Disposition: A | Discharge status: Home / Self Care | Payer: PPO | Source: Ambulatory Visit | Attending: Cardiology | Admitting: Cardiology

## 2022-03-25 ENCOUNTER — Other Ambulatory Visit: Payer: Self-pay | Admitting: Internal Medicine

## 2022-03-25 ENCOUNTER — Ambulatory Visit: Payer: PPO | Attending: Cardiology | Admitting: Cardiology

## 2022-03-25 ENCOUNTER — Encounter: Payer: Self-pay | Admitting: *Deleted

## 2022-03-25 ENCOUNTER — Encounter: Payer: Self-pay | Admitting: Cardiology

## 2022-03-25 VITALS — BP 138/52 | HR 72 | Ht 64.0 in | Wt 183.0 lb

## 2022-03-25 DIAGNOSIS — I483 Typical atrial flutter: Secondary | ICD-10-CM | POA: Diagnosis not present

## 2022-03-25 DIAGNOSIS — R0602 Shortness of breath: Secondary | ICD-10-CM | POA: Insufficient documentation

## 2022-03-25 DIAGNOSIS — I4430 Unspecified atrioventricular block: Secondary | ICD-10-CM | POA: Diagnosis not present

## 2022-03-25 LAB — CBC
HCT: 39.7 % (ref 36.0–46.0)
Hemoglobin: 12.5 g/dL (ref 12.0–15.0)
MCH: 28.9 pg (ref 26.0–34.0)
MCHC: 31.5 g/dL (ref 30.0–36.0)
MCV: 91.7 fL (ref 80.0–100.0)
Platelets: 296 10*3/uL (ref 150–400)
RBC: 4.33 MIL/uL (ref 3.87–5.11)
RDW: 13.3 % (ref 11.5–15.5)
WBC: 5.8 10*3/uL (ref 4.0–10.5)
nRBC: 0 % (ref 0.0–0.2)

## 2022-03-25 NOTE — Patient Instructions (Signed)
Medication Instructions:  none *If you need a refill on your cardiac medications before your next appointment, please call your pharmacy*   Lab Work: CBC, BMP  If you have labs (blood work) drawn today and your tests are completely normal, you will receive your results only by: [place] (if you have MyChart) OR A paper copy in the mail If you have any lab test that is abnormal or we need to change your treatment, we will call you to review the results.   Testing/Procedures: Your physician has requested that you have a cardiac catheterization. Cardiac catheterization is used to diagnose and/or treat various heart conditions. Doctors may recommend this procedure for a number of different reasons. The most common reason is to evaluate chest pain. Chest pain can be a symptom of coronary artery disease (CAD), and cardiac catheterization can show whether plaque is narrowing or blocking your heart's arteries. This procedure is also used to evaluate the valves, as well as measure the blood flow and oxygen levels in different parts of your heart. For further information please visit HugeFiesta.[email or web address]. Please follow instruction sheet, as given.   Your physician has requested that you have cardiac CT. Cardiac computed tomography (CT) is a painless test that uses an x-ray machine to take clear, detailed pictures of your heart. For further information please visit HugeFiesta.[email or web address]. Please follow instruction sheet as given.  Your physician has recommended that you have an ablation. Catheter ablation is a medical procedure used to treat some cardiac arrhythmias (irregular heartbeats). During catheter ablation, a long, thin, flexible tube is put into a blood vessel in your groin (upper thigh), or neck. This tube is called an ablation catheter. It is then guided to your heart through the blood vessel. Radio frequency waves destroy small areas of heart tissue where abnormal heartbeats may cause an  arrhythmia to start. Please see the instruction sheet given to you today.    Follow-Up: At [facility], you and your health needs are our priority.  As part of our continuing mission to provide you with exceptional heart care, we have created designated Provider Care Teams.  These Care Teams include your primary Cardiologist (physician) and Advanced Practice Providers (APPs -  Physician Assistants and Nurse Practitioners) who all work together to provide you with the care you need, when you need it.  We recommend signing up for the patient portal called "MyChart".  Sign up information is provided on this After Visit Summary.  MyChart is used to connect with patients for Virtual Visits (Telemedicine).  Patients are able to view lab/test results, encounter notes, upcoming appointments, etc.  Non-urgent messages can be sent to your provider as well.   To learn more about what you can do with MyChart, go to [email or web address].    Your next appointment:   See instruction letter for ablation follow up. Dr. Saunders Revel will determine to follow up with him after you Heart Cath.   Important Information About [place]

## 2022-03-27 ENCOUNTER — Other Ambulatory Visit: Payer: Self-pay

## 2022-03-27 ENCOUNTER — Encounter: Payer: Self-pay | Admitting: Internal Medicine

## 2022-03-27 ENCOUNTER — Encounter
Admission: RE | Disposition: A | Discharge status: Home / Self Care | Payer: Self-pay | Source: Ambulatory Visit | Attending: Internal Medicine

## 2022-03-27 ENCOUNTER — Ambulatory Visit
Admission: RE | Admit: 2022-03-27 | Discharge: 2022-03-27 | Disposition: A | Discharge status: Home / Self Care | Payer: PPO | Source: Ambulatory Visit | Attending: Internal Medicine | Admitting: Internal Medicine

## 2022-03-27 DIAGNOSIS — I483 Typical atrial flutter: Secondary | ICD-10-CM | POA: Diagnosis not present

## 2022-03-27 DIAGNOSIS — R0602 Shortness of breath: Secondary | ICD-10-CM | POA: Diagnosis not present

## 2022-03-27 DIAGNOSIS — I10 Essential (primary) hypertension: Secondary | ICD-10-CM | POA: Insufficient documentation

## 2022-03-27 DIAGNOSIS — Z9861 Coronary angioplasty status: Secondary | ICD-10-CM | POA: Diagnosis not present

## 2022-03-27 DIAGNOSIS — I2510 Atherosclerotic heart disease of native coronary artery without angina pectoris: Secondary | ICD-10-CM

## 2022-03-27 DIAGNOSIS — I441 Atrioventricular block, second degree: Secondary | ICD-10-CM | POA: Diagnosis not present

## 2022-03-27 HISTORY — DX: Atherosclerotic heart disease of native coronary artery without angina pectoris: I25.10

## 2022-03-27 HISTORY — PX: INTRAVASCULAR PRESSURE WIRE/FFR STUDY: CATH118243

## 2022-03-27 HISTORY — PX: LEFT HEART CATH AND CORONARY ANGIOGRAPHY: CATH118249

## 2022-03-27 LAB — POCT ACTIVATED CLOTTING TIME: Activated Clotting Time: 281 seconds

## 2022-03-27 LAB — BASIC METABOLIC PANEL
Anion gap: 9 (ref 5–15)
BUN: 22 mg/dL (ref 8–23)
CO2: 29 mmol/L (ref 22–32)
Calcium: 9.4 mg/dL (ref 8.9–10.3)
Chloride: 102 mmol/L (ref 98–111)
Creatinine, Ser: 1.14 mg/dL — ABNORMAL HIGH (ref 0.44–1.00)
GFR, Estimated: 48 mL/min — ABNORMAL LOW (ref >60)
Glucose, Bld: 106 mg/dL — ABNORMAL HIGH (ref 70–99)
Potassium: 4.3 mmol/L (ref 3.5–5.1)
Sodium: 140 mmol/L (ref 135–145)

## 2022-03-27 SURGERY — LEFT HEART CATH AND CORONARY ANGIOGRAPHY
Anesthesia: Moderate Sedation

## 2022-03-27 MED ORDER — SODIUM CHLORIDE 0.9% FLUSH: 3.0000 mL | INTRAVENOUS | Status: DC | PRN

## 2022-03-27 MED ORDER — SODIUM CHLORIDE 0.9 % WEIGHT BASED INFUSION: 3.0000 mL/kg/h | INTRAVENOUS | Status: AC

## 2022-03-27 MED ORDER — HEPARIN (PORCINE) IN NACL 1000-0.9 UT/500ML-% IV SOLN
INTRAVENOUS | Status: AC
  Filled 2022-03-27: qty 1000

## 2022-03-27 MED ORDER — FENTANYL CITRATE (PF) 100 MCG/2ML IJ SOLN
INTRAMUSCULAR | Status: AC
  Filled 2022-03-27: qty 2

## 2022-03-27 MED ORDER — HEPARIN SODIUM (PORCINE) 1000 UNIT/ML IJ SOLN
INTRAMUSCULAR | Status: AC
  Filled 2022-03-27: qty 10

## 2022-03-27 MED ORDER — ACETAMINOPHEN 325 MG PO TABS: 650.0000 mg | ORAL_TABLET | ORAL | Status: DC | PRN

## 2022-03-27 MED ORDER — HYDRALAZINE HCL 20 MG/ML IJ SOLN: 10.0000 mg | INTRAMUSCULAR | Status: DC | PRN

## 2022-03-27 MED ORDER — VERAPAMIL HCL 2.5 MG/ML IV SOLN
INTRAVENOUS | Status: DC | PRN
  Administered 2022-03-27 (×2): 2.5 mg via INTRA_ARTERIAL

## 2022-03-27 MED ORDER — SODIUM CHLORIDE 0.9 % IV SOLN: INTRAVENOUS | Status: DC

## 2022-03-27 MED ORDER — HEPARIN SODIUM (PORCINE) 1000 UNIT/ML IJ SOLN
INTRAMUSCULAR | Status: DC | PRN
  Administered 2022-03-27 (×2): 4000 [IU] via INTRAVENOUS

## 2022-03-27 MED ORDER — SODIUM CHLORIDE 0.9% FLUSH: 3.0000 mL | Freq: Two times a day (BID) | INTRAVENOUS | Status: DC

## 2022-03-27 MED ORDER — ONDANSETRON HCL 4 MG/2ML IJ SOLN: 4.0000 mg | Freq: Four times a day (QID) | INTRAMUSCULAR | Status: DC | PRN

## 2022-03-27 MED ORDER — LIDOCAINE HCL (PF) 1 % IJ SOLN
INTRAMUSCULAR | Status: DC | PRN
  Administered 2022-03-27: 2 mL

## 2022-03-27 MED ORDER — HEPARIN (PORCINE) IN NACL 1000-0.9 UT/500ML-% IV SOLN
INTRAVENOUS | Status: DC | PRN
  Administered 2022-03-27 (×2): 500 mL

## 2022-03-27 MED ORDER — MIDAZOLAM HCL 2 MG/2ML IJ SOLN
INTRAMUSCULAR | Status: AC
  Filled 2022-03-27: qty 2

## 2022-03-27 MED ORDER — MIDAZOLAM HCL 2 MG/2ML IJ SOLN
INTRAMUSCULAR | Status: DC | PRN
  Administered 2022-03-27: 1 mg via INTRAVENOUS

## 2022-03-27 MED ORDER — LIDOCAINE HCL 1 % IJ SOLN
INTRAMUSCULAR | Status: AC
  Filled 2022-03-27: qty 20

## 2022-03-27 MED ORDER — ASPIRIN 81 MG PO CHEW: 81.0000 mg | CHEWABLE_TABLET | ORAL | Status: DC

## 2022-03-27 MED ORDER — SODIUM CHLORIDE 0.9 % WEIGHT BASED INFUSION
1.0000 mL/kg/h | INTRAVENOUS | Status: DC
  Administered 2022-03-27: 1 mL/kg/h via INTRAVENOUS

## 2022-03-27 MED ORDER — VERAPAMIL HCL 2.5 MG/ML IV SOLN
INTRAVENOUS | Status: AC
  Filled 2022-03-27: qty 2

## 2022-03-27 MED ORDER — FENTANYL CITRATE (PF) 100 MCG/2ML IJ SOLN
INTRAMUSCULAR | Status: DC | PRN
  Administered 2022-03-27: 25 ug via INTRAVENOUS

## 2022-03-27 MED ORDER — IOHEXOL 300 MG/ML  SOLN
INTRAMUSCULAR | Status: DC | PRN
  Administered 2022-03-27: 39 mL

## 2022-03-27 MED ORDER — SODIUM CHLORIDE 0.9 % IV SOLN: 250.0000 mL | INTRAVENOUS | Status: DC | PRN

## 2022-03-27 MED ORDER — NITROGLYCERIN 1 MG/10 ML FOR IR/CATH LAB
INTRA_ARTERIAL | Status: AC
  Filled 2022-03-27: qty 10

## 2022-03-27 MED ORDER — NITROGLYCERIN 1 MG/10 ML FOR IR/CATH LAB
INTRA_ARTERIAL | Status: DC | PRN
  Administered 2022-03-27: 200 ug via INTRACORONARY

## 2022-03-27 SURGICAL SUPPLY — 14 items
BAND ZEPHYR COMPRESS 30 LONG (HEMOSTASIS) IMPLANT
CATH 5F 110X4 TIG (CATHETERS) IMPLANT
CATH JL3.5 FR DIAG (CATHETERS) IMPLANT
CATH VISTA GUIDE 6FR XB3 (CATHETERS) IMPLANT
DRAPE BRACHIAL (DRAPES) IMPLANT
GLIDESHEATH SLEND SS 6F .021 (SHEATH) IMPLANT
GUIDEWIRE INQWIRE 1.5J.035X260 (WIRE) IMPLANT
GUIDEWIRE PRESS OMNI 185 ST (WIRE) IMPLANT
INQWIRE 1.5J .035X260CM (WIRE) ×1
KIT ENCORE 26 ADVANTAGE (KITS) IMPLANT
PACK CARDIAC CATH (CUSTOM PROCEDURE TRAY) ×1 IMPLANT
PROTECTION STATION PRESSURIZED (MISCELLANEOUS) ×1
SET ATX SIMPLICITY (MISCELLANEOUS) IMPLANT
STATION PROTECTION PRESSURIZED (MISCELLANEOUS) IMPLANT

## 2022-03-27 NOTE — Interval H&P Note (Signed)
History and Physical Interval Note:  03/27/2022 1:22 PM  Julie Duncan  has presented today for surgery, with the diagnosis of shortness of breath.  The various methods of treatment have been discussed with the patient and family. After consideration of risks, benefits and other options for treatment, the patient has consented to  Procedure(s): LEFT HEART CATH AND CORONARY ANGIOGRAPHY (N/A) as a surgical intervention.  The patient's history has been reviewed, patient examined, no change in status, stable for surgery.  I have reviewed the patient's chart and labs.  Questions were answered to the patient's satisfaction.    Cath Lab Visit (complete for each Cath Lab visit)  Clinical Evaluation Leading to the Procedure:   ACS: No.  Non-ACS:    Anginal Classification: CCS III  Anti-ischemic medical therapy: No Therapy  Non-Invasive Test Results: No non-invasive testing performed  Prior CABG: No previous CABG  [name]

## 2022-03-30 ENCOUNTER — Encounter: Payer: Self-pay | Admitting: Internal Medicine

## 2022-04-01 ENCOUNTER — Telehealth: Payer: Self-pay | Admitting: Internal Medicine

## 2022-04-01 NOTE — Telephone Encounter (Signed)
Pt is requesting a call back in regards to procedure she just had on 10/06 with heart cath. She states she has not been updated on what the results were.

## 2022-04-01 NOTE — Telephone Encounter (Signed)
Thank you for the update and for scheduling Ms. Feldpausch to see me next week.  Results were discussed in person with her and her family immediately after the procedure, though I suspect her memory may be somewhat clouded from the sedation which she received as part of the procedure.  Full procedure report should also be available for her review in Care Everywhere.  It should be noted that the after visit summary that she was provided after the catheterization clearly indicates when to restart apixaban.  Nelva Bush, MD [facility]

## 2022-04-01 NOTE — Telephone Encounter (Signed)
Called patient and reviewed the Cath Lab report with her. Patient stated that nobody had spoke to her about it. I reviewed her discharge instructions as well and asked if she had restarted her Eliquis on the 1st post op day. Patient stated that she had not. Her AVS did have instructions to do so, however, patient states that she was never told this. Patient also did not have a follow up scheduled for her post cath. I was able to give her an appointment with Dr. Saunders Revel for this Fri. 10/13 which will be 1 week post cath. I instructed patient to stop her Aspirin and to re-start her Eliquis today.   Patient verbalized understanding and agreed with plan.

## 2022-04-03 ENCOUNTER — Ambulatory Visit: Payer: PPO | Attending: Internal Medicine | Admitting: Internal Medicine

## 2022-04-03 ENCOUNTER — Ambulatory Visit
Admission: RE | Admit: 2022-04-03 | Discharge: 2022-04-03 | Disposition: A | Discharge status: Home / Self Care | Payer: PPO | Source: Ambulatory Visit | Attending: Internal Medicine | Admitting: Internal Medicine

## 2022-04-03 ENCOUNTER — Encounter: Payer: Self-pay | Admitting: Internal Medicine

## 2022-04-03 VITALS — BP 144/70 | HR 72 | Ht 66.0 in | Wt 184.0 lb

## 2022-04-03 DIAGNOSIS — E782 Mixed hyperlipidemia: Secondary | ICD-10-CM | POA: Diagnosis not present

## 2022-04-03 DIAGNOSIS — I2510 Atherosclerotic heart disease of native coronary artery without angina pectoris: Secondary | ICD-10-CM | POA: Insufficient documentation

## 2022-04-03 DIAGNOSIS — I10 Essential (primary) hypertension: Secondary | ICD-10-CM

## 2022-04-03 DIAGNOSIS — R0602 Shortness of breath: Secondary | ICD-10-CM | POA: Diagnosis not present

## 2022-04-03 DIAGNOSIS — I484 Atypical atrial flutter: Secondary | ICD-10-CM | POA: Diagnosis not present

## 2022-04-03 NOTE — Progress Notes (Unsigned)
Follow-up Outpatient Visit Date: 04/03/2022  Primary Care Provider: Marinda Elk, [place] [id number]  Chief Complaint: Post catheterization follow-up  HPI:  Julie Duncan is a 81 y.o. female with history of atrial fibrillation/flutter, SVT status post ablation, Mobitz type I second-degree AV block, atrial myxoma status post resection (09/4032 at [facility]) complicated by postoperative atrial fibrillation, nonobstructive coronary artery disease by recent catheterization (03/2022), hypertension, and osteoarthritis, who presents for follow-up of shortness of breath and fatigue status post recent cardiac catheterization.  I last saw her in late September, at which time she continued to complain of exertional dyspnea and generalized fatigue that had made her homebound.  I was concerned that atrial flutter was driving this.  She subsequently saw Dr. Quentin Ore who agreed that atrial flutter was the likely cause of her symptoms.  However, we were both concerned about the potential for underlying CAD contributing to her symptoms.  She therefore underwent cardiac catheterization last week that showed 50% proximal LAD stenosis that was not hemodynamically significant.  No other disease was identified.  LVEDP was normal.  Today, [name] reports that she feels about the same as at prior visits.  She remains quite frustrated by her fatigue.  While she states that she is hardly able to do anything, she later notes that she was able to go to a concert last night.  She spends most of her time at home, however, as she does not have any energy.  She denies chest pain, shortness of breath, palpitations, lightheadedness, and edema.  She remains compliant with her medications.  She restarted apixaban after conferring with our office earlier this week.  She has a little bit of soreness and bruising at her right radial arteriotomy site, though it is getting better.  She inquires if  her EP study/ablation, currently scheduled for mid December, can be expedited.  --------------------------------------------------------------------------------------------------  Past Medical History:  Diagnosis Date   Atrial flutter ([facility])    Atrial myxoma    a. 2016 s/p resection ([facility]); b. 04/2017 Echo: EF 60-65%, no rwma, mild AI/MR. Mild-mod TR. Nl PASP; c. 07/2021 Echo: EF 60-65%, no rwma, nl RV fxn, mild MR/AI. Ao sclerosis.   Coronary artery disease 03/27/2022   50% mid LAD stenosis (not significant by iFR)   History of stress test    a. 04/2017 ETT: Poor ex capacity. No ischemia. Episode of PSVT noted.   Hyperlipidemia    Hypertension    Mobitz I    Mobitz II    Paroxysmal atrial fibrillation (HCC)    a. CHA2DS2VASc = 4.  Did not tolerate Warfarin. Difficulty in affording DOAC. Eliquis added 07/2021 (new Aflutter).   Paroxysmal SVT (supraventricular tachycardia)    a. 06/2020 Zio: 3813 SVT runs - longest 72m47s, max rate 231; b. 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   Past Surgical History:  Procedure Laterality Date   ANKLE SURGERY Left    CARDIOVERSION N/A 08/26/2021   Procedure: CARDIOVERSION;  Surgeon: ENelva Bush MD;  Location: [facility] ORS;  Service: Cardiovascular;  Laterality: N/A;   EXCISION OF ATRIAL MYXOMA     INTRAVASCULAR PRESSURE WIRE/FFR STUDY N/A 03/27/2022   Procedure: INTRAVASCULAR PRESSURE WIRE/FFR STUDY;  Surgeon: ENelva Bush MD;  [facility];  Service: Cardiovascular;  Laterality: N/A;   LEFT HEART CATH AND CORONARY ANGIOGRAPHY N/A 03/27/2022   Procedure: LEFT HEART CATH AND CORONARY ANGIOGRAPHY;  Surgeon: ENelva Bush MD;  [facility];  Service: Cardiovascular;  Laterality: N/A;   PARTIAL HYSTERECTOMY     SVT ABLATION N/A 07/30/2020   Procedure: SVT ABLATION;  Surgeon: Evans Lance, MD;  Location: [facility];  Service: Cardiovascular;  Laterality: N/A;     Current Meds  Medication Sig   apixaban  (ELIQUIS) 5 MG TABS tablet Take 1 tablet (5 mg total) by mouth 2 (two) times daily.   ascorbic acid (VITAMIN C) 500 MG tablet Take 500 mg by mouth daily.   Cholecalciferol (VITAMIN D PO) Take by mouth daily.   Coenzyme Q10 10 MG capsule Take by mouth daily.   cyanocobalamin 50 MCG tablet Take by mouth daily.   Ginkgo Biloba 40 MG TABS Take 1 tablet by mouth daily.   hydrochlorothiazide (HYDRODIURIL) 25 MG tablet Take 1 tablet (25 mg total) by mouth daily.   losartan (COZAAR) 50 MG tablet Take 1 tablet by mouth twice daily   OVER THE COUNTER MEDICATION Take 2 tablets by mouth daily. Total beets   rosuvastatin (CRESTOR) 10 MG tablet Take 10 mg by mouth daily.   VITAMIN E PO Take by mouth daily.    Allergies: Cortisone, Amlodipine, Red yeast rice  [cholestin], Saccharin, and Warfarin and related  Social History   Tobacco Use   Smoking status: Never   Smokeless tobacco: Never  Vaping Use   Vaping Use: Never used  Substance Use Topics   Alcohol use: No   Drug use: No    Family History  Problem Relation Age of Onset   Stroke Mother 14   Heart attack Father 16   Cancer Father        throat   Cancer Sister 27       lung   Cancer Brother        lung   Heart attack Brother 35    Review of Systems: A 12-system review of systems was performed and was negative except as noted in the HPI.  --------------------------------------------------------------------------------------------------  Physical Exam: BP (!) 144/70 (BP Location: Left Arm, Patient Position: Sitting, Cuff Size: Large)   Pulse 72   Ht '5\' 6"'$  (1.676 m)   Wt 184 lb (83.5 kg)   SpO2 98%   BMI 29.70 kg/m   General:  NAD. Neck: No JVD or HJR. Lungs: Clear to auscultation bilaterally without wheezes or crackles. Heart: Regular rate and rhythm without murmurs, rubs, or gallops. Abdomen: Soft, nontender, nondistended. Extremities: No lower extremity edema.  Right radial arteriotomy site well-healed without hematoma  or bruit.  Mild surrounding bruising still present.  EKG: Atrial flutter with 4:1 AV block and nonspecific ST/T changes.  No significant change since 03/27/2022.  Lab Results  Component Value Date   WBC 5.8 03/25/2022   HGB 12.5 03/25/2022   HCT 39.7 03/25/2022   MCV 91.7 03/25/2022   PLT 296 03/25/2022    Lab Results  Component Value Date   NA 140 03/27/2022   K 4.3 03/27/2022   CL 102 03/27/2022   CO2 29 03/27/2022   BUN 22 03/27/2022   CREATININE 1.14 (H) 03/27/2022   GLUCOSE 106 (H) 03/27/2022   ALT 18 04/14/2017    Lab Results  Component Value Date   CHOL 257 (H) 06/17/2010   HDL 46.60 06/17/2010   LDLCALC 179 01/21/2009   LDLCALC 179 01/21/2009   LDLCALC 179 01/21/2009   LDLDIRECT 183.0 06/17/2010   TRIG 185.0 (H) 06/17/2010   CHOLHDL 6 06/17/2010    --------------------------------------------------------------------------------------------------  ASSESSMENT AND PLAN:  Nonobstructive coronary artery disease: Recent catheterization showed 50% mid LAD stenosis that was not significant by iFR.  LVEDP was also normal.  I do not think that her chronic fatigue is due to CAD or decompensated heart failure.  We will continue apixaban and lieu of aspirin.  Hyperlipidemia: Most recent LDL in 04/2021 with Dr. Carrie Mew was 135, improved from 183 the year before.  She is currently on rosuvastatin 10 mg daily.  In light of moderate CAD noted on a recent catheterization, escalation of rosuvastatin should be considered when her next lipid panel was drawn by Dr. Carrie Mew, particularly if LDL is above 100.  Atrial flutter: Ms. Chou is scheduled for EP study and possible ablation with Dr. Quentin Ore in mid December.  I suspect her chronic fatigue is driven largely by her atrial flutter.  I will reach out to Dr. Quentin Ore to see if there is any possibility to expedite her procedure.  Ms. Spainhower should continue apixaban 5 mg twice daily.  Hypertension: Blood pressure mildly elevated  today.  We have agreed to defer changes to her current regimen of HCTZ and losartan and instead work on continued sodium restriction and lifestyle modifications.  Follow-up: Return to clinic in 3 months.  Nelva Bush, MD 04/04/2022 11:41 AM

## 2022-04-03 NOTE — Patient Instructions (Signed)
Medication Instructions:  Your physician recommends that you continue on your current medications as directed. Please refer to the Current Medication list given to you today.  *If you need a refill on your cardiac medications before your next appointment, please call your pharmacy*  Lab Work: NONE ordered at this time of appointment   If you have labs (blood work) drawn today and your tests are completely normal, you will receive your results only by: [place] (if you have MyChart) OR A paper copy in the mail If you have any lab test that is abnormal or we need to change your treatment, we will call you to review the results.  Testing/Procedures: A chest x-ray takes a picture of the organs and structures inside the chest, including the heart, lungs, and blood vessels. This test can show several things, including, whether the heart is enlarges; whether fluid is building up in the lungs; and whether pacemaker / defibrillator leads are still in place.    Follow-Up: At [facility], you and your health needs are our priority.  As part of our continuing mission to provide you with exceptional heart care, we have created designated Provider Care Teams.  These Care Teams include your primary Cardiologist (physician) and Advanced Practice Providers (APPs -  Physician Assistants and Nurse Practitioners) who all work together to provide you with the care you need, when you need it.    Your next appointment:   3 month(s)  The format for your next appointment:   In Person  Provider:   Nelva Bush, MD    Other Instructions   Important Information About Sugar

## 2022-04-04 ENCOUNTER — Encounter: Payer: Self-pay | Admitting: Internal Medicine

## 2022-04-04 DIAGNOSIS — I2510 Atherosclerotic heart disease of native coronary artery without angina pectoris: Secondary | ICD-10-CM | POA: Insufficient documentation

## 2022-04-08 ENCOUNTER — Telehealth: Payer: Self-pay

## 2022-04-08 DIAGNOSIS — R9389 Abnormal findings on diagnostic imaging of other specified body structures: Secondary | ICD-10-CM

## 2022-04-08 NOTE — Telephone Encounter (Signed)
Called patient and discussed the result note below. Patient verbalized understanding and agreed with plan.

## 2022-04-08 NOTE — Telephone Encounter (Signed)
-----   Message from Nelva Bush, MD sent at 04/06/2022  3:59 PM EDT ----- Please let Julie Duncan know that there are some subtle abnormalities on her chest radiograph that could indicate a chronic lung process contributing to her shortness of breath.  I recommend that we refer her to pulmonary pending her upcoming atrial fibrillation/flutter ablation with Dr. Quentin Ore.

## 2022-04-09 ENCOUNTER — Other Ambulatory Visit
Admission: RE | Admit: 2022-04-09 | Discharge: 2022-04-09 | Disposition: A | Discharge status: Home / Self Care | Payer: PPO | Source: Ambulatory Visit | Attending: Internal Medicine | Admitting: Internal Medicine

## 2022-04-09 ENCOUNTER — Other Ambulatory Visit: Payer: Self-pay | Admitting: *Deleted

## 2022-04-09 DIAGNOSIS — Z79899 Other long term (current) drug therapy: Secondary | ICD-10-CM | POA: Insufficient documentation

## 2022-04-09 DIAGNOSIS — I10 Essential (primary) hypertension: Secondary | ICD-10-CM | POA: Insufficient documentation

## 2022-04-09 LAB — BASIC METABOLIC PANEL
Anion gap: 10 (ref 5–15)
BUN: 24 mg/dL — ABNORMAL HIGH (ref 8–23)
CO2: 28 mmol/L (ref 22–32)
Calcium: 9 mg/dL (ref 8.9–10.3)
Chloride: 99 mmol/L (ref 98–111)
Creatinine, Ser: 1.07 mg/dL — ABNORMAL HIGH (ref 0.44–1.00)
GFR, Estimated: 52 mL/min — ABNORMAL LOW (ref >60)
Glucose, Bld: 88 mg/dL (ref 70–99)
Potassium: 4.3 mmol/L (ref 3.5–5.1)
Sodium: 137 mmol/L (ref 135–145)

## 2022-04-15 ENCOUNTER — Other Ambulatory Visit: Payer: Self-pay

## 2022-04-15 ENCOUNTER — Ambulatory Visit: Payer: PPO | Admitting: Internal Medicine

## 2022-04-15 ENCOUNTER — Emergency Department
Admission: EM | Admit: 2022-04-15 | Discharge: 2022-04-15 | Disposition: A | Discharge status: Home / Self Care | Payer: PPO | Attending: Emergency Medicine | Admitting: Emergency Medicine

## 2022-04-15 ENCOUNTER — Emergency Department: Payer: PPO

## 2022-04-15 ENCOUNTER — Encounter: Payer: Self-pay | Admitting: Emergency Medicine

## 2022-04-15 DIAGNOSIS — K5792 Diverticulitis of intestine, part unspecified, without perforation or abscess without bleeding: Secondary | ICD-10-CM | POA: Diagnosis not present

## 2022-04-15 DIAGNOSIS — R1032 Left lower quadrant pain: Secondary | ICD-10-CM

## 2022-04-15 DIAGNOSIS — D72829 Elevated white blood cell count, unspecified: Secondary | ICD-10-CM | POA: Diagnosis not present

## 2022-04-15 DIAGNOSIS — K8020 Calculus of gallbladder without cholecystitis without obstruction: Secondary | ICD-10-CM | POA: Diagnosis not present

## 2022-04-15 DIAGNOSIS — I10 Essential (primary) hypertension: Secondary | ICD-10-CM | POA: Diagnosis not present

## 2022-04-15 DIAGNOSIS — K5732 Diverticulitis of large intestine without perforation or abscess without bleeding: Secondary | ICD-10-CM | POA: Diagnosis not present

## 2022-04-15 LAB — CBC
HCT: 38 % (ref 36.0–46.0)
Hemoglobin: 12.6 g/dL (ref 12.0–15.0)
MCH: 31.8 pg (ref 26.0–34.0)
MCHC: 33.2 g/dL (ref 30.0–36.0)
MCV: 96 fL (ref 80.0–100.0)
Platelets: 390 10*3/uL (ref 150–400)
RBC: 3.96 MIL/uL (ref 3.87–5.11)
RDW: 13.2 % (ref 11.5–15.5)
WBC: 9.4 10*3/uL (ref 4.0–10.5)
nRBC: 0 % (ref 0.0–0.2)

## 2022-04-15 LAB — COMPREHENSIVE METABOLIC PANEL
ALT: 13 U/L (ref 0–44)
AST: 19 U/L (ref 15–41)
Albumin: 3.7 g/dL (ref 3.5–5.0)
Alkaline Phosphatase: 93 U/L (ref 38–126)
Anion gap: 8 (ref 5–15)
BUN: 21 mg/dL (ref 8–23)
CO2: 29 mmol/L (ref 22–32)
Calcium: 9.1 mg/dL (ref 8.9–10.3)
Chloride: 101 mmol/L (ref 98–111)
Creatinine, Ser: 1.19 mg/dL — ABNORMAL HIGH (ref 0.44–1.00)
GFR, Estimated: 45 mL/min — ABNORMAL LOW (ref >60)
Glucose, Bld: 95 mg/dL (ref 70–99)
Potassium: 3.8 mmol/L (ref 3.5–5.1)
Sodium: 138 mmol/L (ref 135–145)
Total Bilirubin: 0.8 mg/dL (ref 0.3–1.2)
Total Protein: 7.6 g/dL (ref 6.5–8.1)

## 2022-04-15 LAB — URINALYSIS, ROUTINE W REFLEX MICROSCOPIC
Bilirubin Urine: NEGATIVE
Glucose, UA: NEGATIVE mg/dL
Hgb urine dipstick: NEGATIVE
Ketones, ur: NEGATIVE mg/dL
Nitrite: NEGATIVE
Protein, ur: NEGATIVE mg/dL
Specific Gravity, Urine: 1.012 (ref 1.005–1.030)
pH: 6 (ref 5.0–8.0)

## 2022-04-15 LAB — LIPASE, BLOOD: Lipase: 51 U/L (ref 11–51)

## 2022-04-15 MED ORDER — IOHEXOL 300 MG/ML  SOLN
100.0000 mL | Freq: Once | INTRAMUSCULAR | Status: AC | PRN
  Administered 2022-04-15: 80 mL via INTRAVENOUS

## 2022-04-15 MED ORDER — AMOXICILLIN-POT CLAVULANATE 875-125 MG PO TABS: 1.0000 | ORAL_TABLET | Freq: Two times a day (BID) | ORAL | 0 refills | Status: AC

## 2022-04-15 MED ORDER — IBUPROFEN 600 MG PO TABS
600.0000 mg | ORAL_TABLET | Freq: Once | ORAL | Status: AC
  Administered 2022-04-15: 600 mg via ORAL
  Filled 2022-04-15: qty 1

## 2022-04-15 MED ORDER — ONDANSETRON 4 MG PO TBDP: 4.0000 mg | ORAL_TABLET | Freq: Three times a day (TID) | ORAL | 0 refills | Status: DC | PRN

## 2022-04-15 MED ORDER — AMOXICILLIN-POT CLAVULANATE 875-125 MG PO TABS
1.0000 | ORAL_TABLET | Freq: Once | ORAL | Status: AC
  Administered 2022-04-15: 1 via ORAL
  Filled 2022-04-15: qty 1

## 2022-04-15 NOTE — ED Provider Notes (Signed)
[facility] Provider Note    Event Date/Time   First MD Initiated Contact with Patient 04/15/22 1105     (approximate)   History   Abdominal Pain   HPI  Julie Duncan is a 81 y.o. female who presents to the emergency department with abdominal pain.  Endorses 4 days of mild abdominal pain.  States that she was having some diarrhea but she is now having more constipation.  Over the past days she has been having left lower quadrant abdominal pain.  Denies any nausea or vomiting.  Denies fever or chills.  No history of diverticulitis.  No prior abdominal surgery.  No chest pain or shortness of breath.      Physical Exam   Triage Vital Signs: ED Triage Vitals  Enc Vitals Group     BP 04/15/22 1051 107/65     Pulse Rate 04/15/22 1051 98     Resp 04/15/22 1051 17     Temp 04/15/22 1051 98.1 F (36.7 C)     Temp Source 04/15/22 1051 Oral     SpO2 04/15/22 1051 97 %     Weight 04/15/22 1032 183 lb 13.8 oz (83.4 kg)     Height 04/15/22 1032 '5\' 6"'$  (1.676 m)     Head Circumference --      Peak Flow --      Pain Score 04/15/22 1032 5     Pain Loc --      Pain Edu? --      Excl. in [place]? --     Most recent vital signs: Vitals:   04/15/22 1423 04/15/22 1431  BP: (!) 139/59   Pulse: 69   Resp: 18   Temp:  98.2 F (36.8 C)  SpO2: 100%     Physical Exam Constitutional:      Appearance: She is well-developed.  HENT:     Head: Atraumatic.  Eyes:     Conjunctiva/sclera: Conjunctivae normal.  Cardiovascular:     Rate and Rhythm: Regular rhythm.  Pulmonary:     Effort: No respiratory distress.  Abdominal:     General: There is no distension.     Tenderness: There is abdominal tenderness in the left lower quadrant.  Musculoskeletal:        General: Normal range of motion.     Cervical back: Normal range of motion.  Skin:    General: Skin is warm.  Neurological:     Mental Status: She is alert. Mental status is at baseline.           IMPRESSION / MDM / ASSESSMENT AND PLAN / ED COURSE  I reviewed the triage vital signs and the nursing notes.  Differential diagnosis including small bowel obstruction, constipation, intra-abdominal abscess, diverticulitis, dehydration  Plan for lab work and CT scan     EKG  My interpretation of the EKG -normal sinus rhythm.  Normal intervals.  No chamber enlargement.  No significant ST elevation or depression.  No signs of acute ischemia or dysrhythmia.  No tachycardic or bradycardic dysrhythmias while on cardiac telemetry.  RADIOLOGY I independently reviewed imaging, my interpretation of imaging: CT scan of the abdomen and pelvis with findings concerning for diverticulitis.  No signs of a small bowel obstruction.  Read as acute diverticulitis with no abscess or perforation.    ED Results / Procedures / Treatments   Labs (all labs ordered are listed, but only abnormal results are displayed) Labs interpreted as -   Lab  work with leukocytosis.  Creatinine at baseline.  Clinical picture is most consistent with acute diverticulitis.  Patient tolerating p.o.  Pain is well controlled.  Patient given Augmentin and Motrin for pain control.  Discussed clear with liquid diet.  Given return precautions and discussed follow-up with primary care provider.  Labs Reviewed  COMPREHENSIVE METABOLIC PANEL - Abnormal; Notable for the following components:      Result Value   Creatinine, Ser 1.19 (*)    GFR, Estimated 45 (*)    All other components within normal limits  URINALYSIS, ROUTINE W REFLEX MICROSCOPIC - Abnormal; Notable for the following components:   Color, Urine YELLOW (*)    APPearance HAZY (*)    Leukocytes,Ua MODERATE (*)    Bacteria, UA RARE (*)    All other components within normal limits  LIPASE, BLOOD  CBC       PROCEDURES:  Critical Care performed: No  Procedures  Patient's presentation is most consistent with acute presentation with potential  threat to life or bodily function.   MEDICATIONS ORDERED IN ED: Medications  iohexol (OMNIPAQUE) 300 MG/ML solution 100 mL (80 mLs Intravenous Contrast Given 04/15/22 1239)  amoxicillin-clavulanate (AUGMENTIN) 875-125 MG per tablet 1 tablet (1 tablet Oral Given 04/15/22 1418)  ibuprofen (ADVIL) tablet 600 mg (600 mg Oral Given 04/15/22 1418)    FINAL CLINICAL IMPRESSION(S) / ED DIAGNOSES   Final diagnoses:  Left lower quadrant abdominal pain  Diverticulitis     Rx / DC Orders   ED Discharge Orders          Ordered    amoxicillin-clavulanate (AUGMENTIN) 875-125 MG tablet  2 times daily        04/15/22 1409    ondansetron (ZOFRAN-ODT) 4 MG disintegrating tablet  Every 8 hours PRN        04/15/22 1409             Note:  This document was prepared using Dragon voice recognition software and may include unintentional dictation errors.   Nathaniel Man, MD 04/15/22 [phone]

## 2022-04-15 NOTE — ED Triage Notes (Signed)
C/O LLQ abdominal pain x 4 days.  Sent to ED from urgent care.  Patient states she has recently had constipation and hard stools.  Last BM 2 days ago.  States pain worse at night.

## 2022-04-15 NOTE — Discharge Instructions (Addendum)
You were seen in the emergency department for abdominal pain.  You had a CT scan that showed findings concerning for diverticulitis.  You can alternate ibuprofen and Tylenol for pain control.  Start your antibiotic and take it twice a day as prescribed.  It is importantly return to the emergency department if you have ongoing pain in 2 days or if you have worsening pain you need to return to the emergency department so we can reevaluate your abdominal pain and diverticulitis.  Call your primary care physician today to schedule close follow-up.  Clear liquid diet for the next couple of days until your abdominal pain symptoms have significantly improved.  Once your symptoms of abdominal pain has improved you can slowly reintroduce food.

## 2022-04-23 ENCOUNTER — Ambulatory Visit (INDEPENDENT_AMBULATORY_CARE_PROVIDER_SITE_OTHER): Payer: PPO | Admitting: Internal Medicine

## 2022-04-23 ENCOUNTER — Encounter: Payer: Self-pay | Admitting: Internal Medicine

## 2022-04-23 VITALS — BP 118/70 | HR 71 | Temp 97.7°F | Ht 63.75 in | Wt 181.0 lb

## 2022-04-23 DIAGNOSIS — M19079 Primary osteoarthritis, unspecified ankle and foot: Secondary | ICD-10-CM

## 2022-04-23 DIAGNOSIS — K5792 Diverticulitis of intestine, part unspecified, without perforation or abscess without bleeding: Secondary | ICD-10-CM

## 2022-04-23 DIAGNOSIS — N1831 Chronic kidney disease, stage 3a: Secondary | ICD-10-CM | POA: Diagnosis not present

## 2022-04-23 DIAGNOSIS — I480 Paroxysmal atrial fibrillation: Secondary | ICD-10-CM

## 2022-04-23 DIAGNOSIS — I10 Essential (primary) hypertension: Secondary | ICD-10-CM

## 2022-04-23 NOTE — Assessment & Plan Note (Signed)
BP Readings from Last 3 Encounters:  04/23/22 118/70  04/15/22 (!) 139/59  04/03/22 (!) 144/70   Fine on losartan 50 and HCTZ 25 Recent labs okay

## 2022-04-23 NOTE — Patient Instructions (Signed)
You can try topical diclofenac gel on your ankles (over the counter).  I recommended senna-s 2 tabs once or twice a day to try to keep your bowels regular.

## 2022-04-23 NOTE — Assessment & Plan Note (Signed)
Multiple visits/doctors without success Discussed trying diclofenac topical

## 2022-04-23 NOTE — Assessment & Plan Note (Signed)
Is scheduled for ablation Regular today Is on eliquis 5 bid

## 2022-04-23 NOTE — Assessment & Plan Note (Signed)
Better now Done with the augmentin Discussed bowel regimen

## 2022-04-23 NOTE — Assessment & Plan Note (Signed)
Is on the losartan Will recheck at next visit

## 2022-04-23 NOTE — Progress Notes (Signed)
Subjective:    Patient ID: Julie Duncan, female    DOB: 09-04-1940, 81 y.o.   MRN: [id number]  HPI Here to establish care  Was not satisfied with care with PCP at [place] problems ---had been seeing her for many years  Seen in ER last week Progressively worsening abd pain Then went to urgent care--sent to ER Diagnosed with diverticulitis----given augmentin and is much better now Eats oatmeal daily to help slow bowels Not a big eater---doesn't cook in past 10 years since husband died. Mostly buys fast food  Atrial flutter in past---did have ablation Now atrial fibrillation--she thinks it was related to cortisone shot Then found out that the calcium in MVI seemed to worsen his shoulders  Past atrial myoxma removed at [place] cath showed moderate one vessel CAD (LAD) Atrial fib ablation now planned soon at [facility] in exertion---has to rest after mild exertion No chest pain No edema No dizziness or syncope  Bad trouble with ankles Multiple ortho doctors Gets swollen feeling on plantar feet No longer able to dance Aleve prn  Last GFR 45  Current Outpatient Medications on File Prior to Visit  Medication Sig Dispense Refill   apixaban (ELIQUIS) 5 MG TABS tablet Take 1 tablet (5 mg total) by mouth 2 (two) times daily. 60 tablet 3   FIBER PO Take by mouth.     hydrochlorothiazide (HYDRODIURIL) 25 MG tablet Take 1 tablet (25 mg total) by mouth daily. 90 tablet 3   losartan (COZAAR) 50 MG tablet Take 1 tablet by mouth twice daily 180 tablet 0   ascorbic acid (VITAMIN C) 500 MG tablet Take 500 mg by mouth daily.     Cholecalciferol (VITAMIN D PO) Take by mouth daily.     Coenzyme Q10 10 MG capsule Take by mouth daily.     cyanocobalamin 50 MCG tablet Take by mouth daily.     Ginkgo Biloba 40 MG TABS Take 1 tablet by mouth daily.     OVER THE COUNTER MEDICATION Take 2 tablets by mouth daily. Total beets     VITAMIN E PO Take by mouth daily.     No  current facility-administered medications on file prior to visit.    Allergies  Allergen Reactions   Cortisone Other (See Comments)    Patient states cortisone injections have "messes with my heart".  Has had cortisone shots and each time causes "heart rhythm problems".   Amlodipine Itching   Red Yeast Rice  [Cholestin] Other (See Comments)   Saccharin Other (See Comments)   Warfarin And Related     anorexia    Past Medical History:  Diagnosis Date   Atrial flutter ([place])    Atrial myxoma    a. 2016 s/p resection ([facility]); b. 04/2017 Echo: EF 60-65%, no rwma, mild AI/MR. Mild-mod TR. Nl PASP; c. 07/2021 Echo: EF 60-65%, no rwma, nl RV fxn, mild MR/AI. Ao sclerosis.   Coronary artery disease 03/27/2022   50% mid LAD stenosis (not significant by iFR)   History of stress test    a. 04/2017 ETT: Poor ex capacity. No ischemia. Episode of PSVT noted.   Hyperlipidemia    Hypertension    Mobitz I    Mobitz II    Paroxysmal atrial fibrillation (HCC)    a. CHA2DS2VASc = 4.  Did not tolerate Warfarin. Difficulty in affording DOAC. Eliquis added 07/2021 (new Aflutter).   Paroxysmal SVT (supraventricular tachycardia)    a. 06/2020 Zio: 3813 SVT runs -  longest 3m47s, max rate 231; b. 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    Past Surgical History:  Procedure Laterality Date   ANKLE SURGERY Left    CARDIOVERSION N/A 08/26/2021   Procedure: CARDIOVERSION;  Surgeon: ENelva Bush MD;  Location: [facility] ORS;  Service: Cardiovascular;  Laterality: N/A;   EXCISION OF ATRIAL MYXOMA     INTRAVASCULAR PRESSURE WIRE/FFR STUDY N/A 03/27/2022   Procedure: INTRAVASCULAR PRESSURE WIRE/FFR STUDY;  Surgeon: ENelva Bush MD;  [facility];  Service: Cardiovascular;  Laterality: N/A;   LEFT HEART CATH AND CORONARY ANGIOGRAPHY N/A 03/27/2022   Procedure: LEFT HEART CATH AND CORONARY ANGIOGRAPHY;  Surgeon: ENelva Bush MD;  [facility];  Service: Cardiovascular;   Laterality: N/A;   PARTIAL HYSTERECTOMY     SVT ABLATION N/A 07/30/2020   Procedure: SVT ABLATION;  Surgeon: TEvans Lance MD;  [facility];  Service: Cardiovascular;  Laterality: N/A;   WRIST SURGERY Right     Family History  Problem Relation Age of Onset   Stroke Mother 810  Heart attack Father 734  Cancer Father        throat   Cancer Sister 541      lung   Cancer Brother        lung   Heart attack Brother 762   Social History   Socioeconomic History   Marital status: Widowed    Spouse name: Not on file   Number of children: 2   Years of education: Not on file   Highest education level: Not on file  Occupational History   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 Has a cat.   [place]     Has living will   Daughter is health care POA   Would accept resuscitation   Not sure about tube feeds   Social Determinants of Health   Financial Resource Strain: Not on file  Food Insecurity: Not on file  Transportation Needs: Not on file  Physical Activity: Not on file  Stress: Not on file  Social Connections: Not on file  Intimate Partner Violence: Not on file   Review of Systems  Constitutional:  Negative for fatigue and unexpected weight change.  Eyes:        Will need cataracts removed  Respiratory:  Positive for shortness of breath. Negative for cough and chest tightness.   Cardiovascular:  Negative for chest pain and leg swelling.       Does feel heart beat harder at times  Gastrointestinal:  Negative for blood in stool and constipation.       No heartburn  Genitourinary:  Negative for dysuria and  hematuria.  Musculoskeletal:  Positive for back pain and joint swelling.  Skin:  Negative for rash.       Careful with cat---will bite  Neurological:  Negative for dizziness, syncope, light-headedness and headaches.  Psychiatric/Behavioral:  Negative for dysphoric mood and sleep disturbance. The patient is not nervous/anxious.        Life has been  hard Grandchild committed suicide in past year       Objective:   Physical Exam Constitutional:      Appearance: Normal appearance.  Cardiovascular:     Rate and Rhythm: Normal rate and regular rhythm.     Heart sounds: No murmur heard.    No gallop.     Comments: Faint pedal pulses Pulmonary:     Effort: Pulmonary effort is normal.     Breath sounds: Normal breath sounds. No wheezing or rales.  Abdominal:     Palpations: Abdomen is soft.     Tenderness: There is no abdominal tenderness.  Musculoskeletal:     Cervical back: Neck supple.     Comments: Thickened ankles but no edema  Lymphadenopathy:     Cervical: No cervical adenopathy.  Skin:    Findings: No lesion or rash.  Neurological:     Mental Status: She is alert.            Assessment & Plan:

## 2022-04-27 ENCOUNTER — Ambulatory Visit (INDEPENDENT_AMBULATORY_CARE_PROVIDER_SITE_OTHER): Payer: PPO | Admitting: Pulmonary Disease

## 2022-04-27 ENCOUNTER — Encounter: Payer: Self-pay | Admitting: Pulmonary Disease

## 2022-04-27 VITALS — BP 120/48 | HR 70 | Temp 98.0°F | Ht 63.75 in | Wt 183.3 lb

## 2022-04-27 DIAGNOSIS — J849 Interstitial pulmonary disease, unspecified: Secondary | ICD-10-CM

## 2022-04-27 NOTE — Patient Instructions (Signed)
We will check a high resolution CT Chest Scan  Once we have the results of the CT scan we will discuss the next steps.  Follow up in 2 months

## 2022-04-27 NOTE — Progress Notes (Unsigned)
Synopsis: Referred in November 2023 for shortness of breath by Paulita Cradle, MD  Subjective:   PATIENT ID: Julie Duncan GENDER: female DOB: 02/19/1941, MRN: [id number]  HPI  Chief Complaint  Patient presents with   Consult    Referred by heart Doctor DOE   Bernese Doffing is an 81 year old woman, never smoker with history of atrial fibrillation/flutter, atrial myxoma s/p resection 2016,  and hypertension who is referred to pulmonary clinic for dyspnea.  She reports on going issues with fatigue that she feels is related to her atrial fibrillation.She is scheduled for an ablation procedure in December. She denies any cough or wheezing. She denies much shortness of breath but has generalized fatigue. She denies night time awakenings due to cough or dyspnea.   She has runny nose. Denies post-nasal drainage. She denies issues with heart burn.   She is a retired Chief Financial Officer, previously worked at [employer] in [employer]. She denies history of dust or chemical exposures. She has no hobbies with dust or chemical exposures. She has a International aid/development worker at home.   Chest radiograph 04/03/22 shows non-specific basilar interstiital thickening. CT abdomen/pelvis shows possible mild interstitial thickening vs atelectasis as it is in dependent areas.  Past Medical History:  Diagnosis Date   Atrial flutter ([place])    Atrial myxoma    a. 2016 s/p resection ([facility]); b. 04/2017 Echo: EF 60-65%, no rwma, mild AI/MR. Mild-mod TR. Nl PASP; c. 07/2021 Echo: EF 60-65%, no rwma, nl RV fxn, mild MR/AI. Ao sclerosis.   Coronary artery disease 03/27/2022   50% mid LAD stenosis (not significant by iFR)   History of stress test    a. 04/2017 ETT: Poor ex capacity. No ischemia. Episode of PSVT noted.   Hyperlipidemia    Hypertension    Mobitz I    Mobitz II    Paroxysmal atrial fibrillation (HCC)    a. CHA2DS2VASc = 4.  Did not tolerate Warfarin. Difficulty in affording DOAC. Eliquis added 07/2021 (new Aflutter).   Paroxysmal SVT  (supraventricular tachycardia)    a. 06/2020 Zio: 3813 SVT runs - longest 33m47s, max rate 231; b. 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 Has a cat.   Daughter in [place] Son in [place]     Has living will   Daughter is health care POA   Would accept resuscitation   Not sure about tube  feeds   Social Determinants of Health   Financial Resource Strain: Not on file  Food Insecurity: Not on file  Transportation Needs: Not on file  Physical Activity: Not on file  Stress: Not on file  Social Connections: Not on file  Intimate Partner Violence: Not on file     Allergies  Allergen Reactions   Cortisone Other (See Comments)    Patient states cortisone injections have "messes with my heart".  Has had cortisone shots and each time causes "heart rhythm problems".   Amlodipine Itching   Red Yeast Rice  [Cholestin] Other (See Comments)   Saccharin Other (See Comments)   Warfarin And Related     anorexia     Outpatient Medications Prior to Visit  Medication Sig Dispense Refill   apixaban (ELIQUIS) 5 MG TABS tablet Take 1 tablet (5 mg total) by mouth 2 (two) times daily. 60 tablet 3   ascorbic acid  (VITAMIN C) 500 MG tablet Take 500 mg by mouth daily.     Cholecalciferol (VITAMIN D PO) Take by mouth daily.     Coenzyme Q10 10 MG capsule Take by mouth daily.     cyanocobalamin 50 MCG tablet Take by mouth daily.     FIBER PO Take by mouth.     Ginkgo Biloba 40 MG TABS Take 1 tablet by mouth daily.     hydrochlorothiazide (HYDRODIURIL) 25 MG tablet Take 1 tablet (25 mg total) by mouth daily. 90 tablet 3   losartan (COZAAR) 50 MG tablet Take 1 tablet by mouth twice daily 180 tablet 0   OVER THE COUNTER MEDICATION Take 2 tablets by mouth daily. Total beets     VITAMIN E PO Take by mouth daily.     No facility-administered medications prior to visit.    Review of Systems  Constitutional:  Positive for malaise/fatigue. Negative for chills, fever and weight loss.  HENT:  Negative for congestion, sinus pain and sore throat.   Eyes: Negative.   Respiratory:  Positive for shortness of breath. Negative for cough, hemoptysis, sputum production and wheezing.   Cardiovascular:  Negative for chest pain, palpitations, orthopnea, claudication and leg swelling.  Gastrointestinal:  Negative for abdominal pain, heartburn, nausea and vomiting.  Genitourinary: Negative.   Musculoskeletal:  Negative for joint pain and myalgias.  Skin:  Negative for rash.  Neurological:  Negative for weakness.  Endo/Heme/Allergies: Negative.   Psychiatric/Behavioral: Negative.        Objective:   Vitals:   04/27/22 1427  BP: (!) 120/48  Pulse: 70  Temp: 98 F (36.7 C)  TempSrc: Oral  SpO2: 98%  Weight: 183 lb 4.8 oz (83.1 kg)  Height: 5' 3.75" (1.619 m)     Physical Exam Constitutional:      General: She is not in acute distress.    Appearance: She is not ill-appearing.  HENT:     Head: Normocephalic and atraumatic.  Eyes:     General: No scleral icterus.    Conjunctiva/sclera: Conjunctivae normal.     Pupils: Pupils are equal, round, and reactive to light.  Cardiovascular:     Rate and Rhythm:  Normal rate and regular rhythm.     Pulses: Normal pulses.     Heart sounds: Normal heart sounds. No murmur heard. Pulmonary:     Effort: Pulmonary effort is normal.     Breath sounds: Normal breath sounds. No wheezing, rhonchi or rales.  Abdominal:     General: Bowel sounds are normal.  Palpations: Abdomen is soft.  Musculoskeletal:     Right lower leg: No edema.     Left lower leg: No edema.  Lymphadenopathy:     Cervical: No cervical adenopathy.  Skin:    General: Skin is warm and dry.  Neurological:     General: No focal deficit present.     Mental Status: She is alert.  Psychiatric:        Mood and Affect: Mood normal.        Behavior: Behavior normal.        Thought Content: Thought content normal.        Judgment: Judgment normal.       CBC    Component Value Date/Time   WBC 9.4 04/15/2022 1051   RBC 3.96 04/15/2022 1051   HGB 12.6 04/15/2022 1051   HGB 13.2 07/29/2021 0858   HCT 38.0 04/15/2022 1051   HCT 35.8 07/29/2021 0858   PLT 390 04/15/2022 1051   PLT 317 07/29/2021 0858   MCV 96.0 04/15/2022 1051   MCV 93 07/29/2021 0858   MCH 31.8 04/15/2022 1051   MCHC 33.2 04/15/2022 1051   RDW 13.2 04/15/2022 1051   RDW 12.4 07/29/2021 0858   LYMPHSABS 2.1 07/24/2020 1053   MONOABS 0.5 06/11/2020 1053   EOSABS 0.2 07/24/2020 1053   BASOSABS 0.0 07/24/2020 1053      Latest Ref Rng & Units 04/15/2022   10:51 AM 04/09/2022    5:08 PM 03/27/2022   11:51 AM  BMP  Glucose 70 - 99 mg/dL 95  88  106   BUN 8 - 23 mg/dL '21  24  22   '$ Creatinine 0.44 - 1.00 mg/dL 1.19  1.07  1.14   Sodium 135 - 145 mmol/L 138  137  140   Potassium 3.5 - 5.1 mmol/L 3.8  4.3  4.3   Chloride 98 - 111 mmol/L 101  99  102   CO2 22 - 32 mmol/L '29  28  29   '$ Calcium 8.9 - 10.3 mg/dL 9.1  9.0  9.4    Chest imaging: CXR 04/03/22 Cardiac silhouette and mediastinal contours within normal limits. Mild-to-moderate calcification within the aortic arch. Minimal chronic interstitial  thickening is unchanged. No new focal airspace opacity. No pleural effusion or pneumothorax. Right anterior mediastinal surgical clips are again seen. Additional anterior right lateral lungs surgical clips. Mild-to-moderate multilevel degenerative disc changes of the thoracic spine.  PFT:     No data to display          Labs:  Path:  Echo:  Heart Catheterization:  Assessment & Plan:   ILD (interstitial lung disease) ([place]) - Plan: CT CHEST HIGH RESOLUTION  Discussion: Emi Lymon is an 81 year old woman, never smoker with history of atrial fibrillation/flutter, atrial myxoma s/p resection 2016,  and hypertension who is referred to pulmonary clinic for dyspnea.  Given her chest x-ray with non-specific basilar interstitial thickening and CT abdomen/pelvis with similar findings in the lower lung fields we will obtain a HRCT chest for further evaluation of possible interstitial lung disease. We will also schedule her for PFTs.   Follow up in 2 months.  Freda Jackson, MD [facility] Office: [phone]   Current Outpatient Medications:    apixaban (ELIQUIS) 5 MG TABS tablet, Take 1 tablet (5 mg total) by mouth 2 (two) times daily., Disp: 60 tablet, Rfl: 3   ascorbic acid (VITAMIN C) 500 MG tablet, Take 500 mg by mouth daily.,  Disp: , Rfl:    Cholecalciferol (VITAMIN D PO), Take by mouth daily., Disp: , Rfl:    Coenzyme Q10 10 MG capsule, Take by mouth daily., Disp: , Rfl:    cyanocobalamin 50 MCG tablet, Take by mouth daily., Disp: , Rfl:    FIBER PO, Take by mouth., Disp: , Rfl:    Ginkgo Biloba 40 MG TABS, Take 1 tablet by mouth daily., Disp: , Rfl:    hydrochlorothiazide (HYDRODIURIL) 25 MG tablet, Take 1 tablet (25 mg total) by mouth daily., Disp: 90 tablet, Rfl: 3   losartan (COZAAR) 50 MG tablet, Take 1 tablet by mouth twice daily, Disp: 180 tablet, Rfl: 0   OVER THE COUNTER MEDICATION, Take 2 tablets by mouth daily. Total beets, Disp: , Rfl:     VITAMIN E PO, Take by mouth daily., Disp: , Rfl:

## 2022-04-29 ENCOUNTER — Encounter: Payer: Self-pay | Admitting: Pulmonary Disease

## 2022-04-30 ENCOUNTER — Telehealth: Payer: Self-pay | Admitting: *Deleted

## 2022-04-30 DIAGNOSIS — I4892 Unspecified atrial flutter: Secondary | ICD-10-CM

## 2022-05-01 ENCOUNTER — Ambulatory Visit
Admission: RE | Admit: 2022-05-01 | Discharge: 2022-05-01 | Disposition: A | Discharge status: Home / Self Care | Payer: PPO | Source: Ambulatory Visit | Attending: Pulmonary Disease | Admitting: Pulmonary Disease

## 2022-05-01 DIAGNOSIS — J849 Interstitial pulmonary disease, unspecified: Secondary | ICD-10-CM | POA: Diagnosis not present

## 2022-05-01 DIAGNOSIS — I700 Atherosclerosis of aorta: Secondary | ICD-10-CM | POA: Diagnosis not present

## 2022-05-01 DIAGNOSIS — I2510 Atherosclerotic heart disease of native coronary artery without angina pectoris: Secondary | ICD-10-CM | POA: Diagnosis not present

## 2022-05-01 DIAGNOSIS — J984 Other disorders of lung: Secondary | ICD-10-CM | POA: Diagnosis not present

## 2022-05-01 DIAGNOSIS — R918 Other nonspecific abnormal finding of lung field: Secondary | ICD-10-CM | POA: Diagnosis not present

## 2022-05-08 ENCOUNTER — Other Ambulatory Visit: Payer: Self-pay

## 2022-05-08 DIAGNOSIS — I4892 Unspecified atrial flutter: Secondary | ICD-10-CM

## 2022-05-08 NOTE — Telephone Encounter (Signed)
Work up is complete for Afib Ablation. Pt is aware of upcoming date/times and procedure instructions.   She will have labwork done at the medical mall at [facility] on the week of 11/20-11/22.  CT is scheduled for 05/29/22.  Instruction letters have been mailed to pt.

## 2022-05-11 ENCOUNTER — Other Ambulatory Visit
Admission: RE | Admit: 2022-05-11 | Discharge: 2022-05-11 | Disposition: A | Discharge status: Home / Self Care | Payer: PPO | Source: Ambulatory Visit | Attending: Cardiology | Admitting: Cardiology

## 2022-05-11 DIAGNOSIS — I4892 Unspecified atrial flutter: Secondary | ICD-10-CM | POA: Diagnosis not present

## 2022-05-11 LAB — BASIC METABOLIC PANEL
Anion gap: 9 (ref 5–15)
BUN: 20 mg/dL (ref 8–23)
CO2: 29 mmol/L (ref 22–32)
Calcium: 9.5 mg/dL (ref 8.9–10.3)
Chloride: 105 mmol/L (ref 98–111)
Creatinine, Ser: 1.04 mg/dL — ABNORMAL HIGH (ref 0.44–1.00)
GFR, Estimated: 53 mL/min — ABNORMAL LOW (ref >60)
Glucose, Bld: 91 mg/dL (ref 70–99)
Potassium: 3.8 mmol/L (ref 3.5–5.1)
Sodium: 143 mmol/L (ref 135–145)

## 2022-05-11 LAB — CBC WITH DIFFERENTIAL/PLATELET
Abs Immature Granulocytes: 0.02 10*3/uL (ref 0.00–0.07)
Basophils Absolute: 0 10*3/uL (ref 0.0–0.1)
Basophils Relative: 1 %
Eosinophils Absolute: 0.2 10*3/uL (ref 0.0–0.5)
Eosinophils Relative: 3 %
HCT: 39.3 % (ref 36.0–46.0)
Hemoglobin: 12.5 g/dL (ref 12.0–15.0)
Immature Granulocytes: 0 %
Lymphocytes Relative: 31 %
Lymphs Abs: 1.8 10*3/uL (ref 0.7–4.0)
MCH: 29.5 pg (ref 26.0–34.0)
MCHC: 31.8 g/dL (ref 30.0–36.0)
MCV: 92.7 fL (ref 80.0–100.0)
Monocytes Absolute: 0.5 10*3/uL (ref 0.1–1.0)
Monocytes Relative: 8 %
Neutro Abs: 3.3 10*3/uL (ref 1.7–7.7)
Neutrophils Relative %: 57 %
Platelets: 339 10*3/uL (ref 150–400)
RBC: 4.24 MIL/uL (ref 3.87–5.11)
RDW: 13 % (ref 11.5–15.5)
WBC: 5.8 10*3/uL (ref 4.0–10.5)
nRBC: 0 % (ref 0.0–0.2)

## 2022-05-27 NOTE — Progress Notes (Signed)
Tried calling the pt x 2 and line rings busy. Will try again later.

## 2022-05-28 ENCOUNTER — Telehealth (HOSPITAL_COMMUNITY): Payer: Self-pay | Admitting: *Deleted

## 2022-05-28 NOTE — Telephone Encounter (Signed)
Attempted to call patient regarding upcoming cardiac CT appointment. °Left message on voicemail with name and callback number ° °Pammy Vesey RN Navigator Cardiac Imaging °[facility] °[phone] Office °[phone] Cell ° °

## 2022-05-29 ENCOUNTER — Ambulatory Visit
Admission: RE | Admit: 2022-05-29 | Discharge: 2022-05-29 | Disposition: A | Discharge status: Home / Self Care | Payer: PPO | Source: Ambulatory Visit | Attending: Cardiology | Admitting: Cardiology

## 2022-05-29 DIAGNOSIS — Z01818 Encounter for other preprocedural examination: Secondary | ICD-10-CM | POA: Diagnosis not present

## 2022-05-29 DIAGNOSIS — I4892 Unspecified atrial flutter: Secondary | ICD-10-CM | POA: Insufficient documentation

## 2022-05-29 MED ORDER — IOHEXOL 350 MG/ML SOLN
100.0000 mL | Freq: Once | INTRAVENOUS | Status: AC | PRN
  Administered 2022-05-29: 100 mL via INTRAVENOUS

## 2022-06-04 NOTE — Anesthesia Preprocedure Evaluation (Addendum)
Anesthesia Evaluation  Patient identified by MRN, date of birth, ID band Patient awake    Reviewed: Allergy & Precautions, H&P , NPO status , Patient's Chart, lab work & pertinent test results  History of Anesthesia Complications Negative for: history of anesthetic complications  Airway Mallampati: II  TM Distance: >3 FB Neck ROM: Full    Dental  (+) Dental Advisory Given, Missing   Pulmonary neg pulmonary ROS   Pulmonary exam normal        Cardiovascular Exercise Tolerance: Poor hypertension, On Medications + dysrhythmias Atrial Fibrillation  Rhythm:Regular Rate:Normal  Cath 03/27/22 Conclusions: 1. Moderate, nonobstructive single-vessel coronary artery disease with 50% mid LAD stenosis (iFR = 1.0).  No angiographically significant disease observed in the LCx or RCA. 2. Normal left ventricular filling pressure (LVEDP 13 mmHg).   Recommendations: 1. Medical therapy and risk factor modification to prevent coronary artery disease.  Consider addition of statin at follow-up visit. 2. Restart apixaban 5 mg twice daily tomorrow morning if no evidence of bleeding/vascular complications. 3. Ongoing management of atrial fibrillation/flutter that is likely driving the patient's shortness of breath per Dr. Quentin Ore.   Nelva Bush, MD. 07/2021   Echo: EF 60-65%, no rwma, nl RV fxn, mild MR/AI. Ao sclerosis. [place]      Neuro/Psych negative neurological ROS  negative psych ROS   GI/Hepatic Neg liver ROS,GERD  Medicated,,  Endo/Other  negative endocrine ROS    Renal/GU negative Renal ROS  negative genitourinary   Musculoskeletal   Abdominal   Peds  Hematology negative hematology ROS (+)   Anesthesia Other Findings Past Medical History: No date: Atrial flutter ([place]) No date: Atrial myxoma     Comment:  a. 2016 s/p resection ([facility]); b. 04/2017 Echo: EF               60-65%, no rwma, mild AI/MR. Mild-mod TR. Nl  PASP; c.               07/2021 Echo: EF 60-65%, no rwma, nl RV fxn, mild MR/AI.               Ao sclerosis. No date: History of stress test     Comment:  a. 04/2017 ETT: Poor ex capacity. No ischemia. Episode               of PSVT noted. No date: Hyperlipidemia No date: Hypertension No date: Mobitz I No date: Mobitz II No date: Paroxysmal atrial fibrillation (HCC)     Comment:  a. CHA2DS2VASc = 4.  Did not tolerate Warfarin.               Difficulty in affording DOAC. Eliquis added 07/2021 (new               Aflutter). No date: Paroxysmal SVT (supraventricular tachycardia) ([place])     Comment:  a. 06/2020 Zio: 3813 SVT runs - longest 36m47s, max rate              231; b. 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 Past Surgical History: No date: ANKLE SURGERY; Left No date: EXCISION OF ATRIAL MYXOMA No date: PARTIAL HYSTERECTOMY 07/30/2020: SVT ABLATION; N/A     Comment:  Procedure: SVT ABLATION;  Surgeon: TEvans Lance MD;               [facility];  Service: Cardiovascular;                Laterality: N/A; BMI  Body Mass Index: 29.54 kg/m     Reproductive/Obstetrics negative OB ROS                             Anesthesia Physical Anesthesia Plan  ASA: 3  Anesthesia Plan: General   Post-op Pain Management: Minimal or no pain anticipated and Tylenol PO (pre-op)*   Induction: Intravenous  PONV Risk Score and Plan: 3 and Ondansetron, Dexamethasone and Treatment may vary due to age or medical condition  Airway Management Planned: Oral ETT  Additional Equipment:   Intra-op Plan:   Post-operative Plan: Extubation in OR  Informed Consent: I have reviewed the patients History and Physical, chart, labs and discussed the procedure including the risks, benefits and alternatives for the proposed anesthesia with the patient or authorized representative who has indicated his/her understanding and acceptance.     Dental advisory given  Plan  Discussed with: Anesthesiologist  Anesthesia Plan Comments:         Anesthesia Quick Evaluation

## 2022-06-04 NOTE — Pre-Procedure Instructions (Signed)
Attempted to call patient regarding procedure instructions.  Left voicemail on the following items: Arrival time 0515 Nothing to eat or drink after midnight No meds AM of procedure Responsible person to drive you home and stay with you for 24 hrs Wash with special soap night before and morning of procedure If on anti-coagulant drug instructions Eliquis- take both doses today, if you have missed any doses please call the office

## 2022-06-05 ENCOUNTER — Ambulatory Visit (HOSPITAL_BASED_OUTPATIENT_CLINIC_OR_DEPARTMENT_OTHER): Payer: PPO | Admitting: Anesthesiology

## 2022-06-05 ENCOUNTER — Other Ambulatory Visit: Payer: Self-pay

## 2022-06-05 ENCOUNTER — Ambulatory Visit (HOSPITAL_COMMUNITY)
Admission: RE | Admit: 2022-06-05 | Discharge: 2022-06-05 | Disposition: A | Discharge status: Home / Self Care | Payer: PPO | Attending: Cardiology | Admitting: Cardiology

## 2022-06-05 ENCOUNTER — Ambulatory Visit (HOSPITAL_COMMUNITY): Payer: PPO | Admitting: Anesthesiology

## 2022-06-05 ENCOUNTER — Encounter (HOSPITAL_COMMUNITY)
Admission: RE | Disposition: A | Discharge status: Home / Self Care | Payer: Self-pay | Source: Home / Self Care | Attending: Cardiology

## 2022-06-05 DIAGNOSIS — Z8249 Family history of ischemic heart disease and other diseases of the circulatory system: Secondary | ICD-10-CM | POA: Insufficient documentation

## 2022-06-05 DIAGNOSIS — I480 Paroxysmal atrial fibrillation: Secondary | ICD-10-CM | POA: Diagnosis not present

## 2022-06-05 DIAGNOSIS — I4891 Unspecified atrial fibrillation: Secondary | ICD-10-CM | POA: Insufficient documentation

## 2022-06-05 DIAGNOSIS — Z90711 Acquired absence of uterus with remaining cervical stump: Secondary | ICD-10-CM | POA: Insufficient documentation

## 2022-06-05 DIAGNOSIS — I10 Essential (primary) hypertension: Secondary | ICD-10-CM | POA: Insufficient documentation

## 2022-06-05 DIAGNOSIS — I4892 Unspecified atrial flutter: Secondary | ICD-10-CM | POA: Insufficient documentation

## 2022-06-05 DIAGNOSIS — E785 Hyperlipidemia, unspecified: Secondary | ICD-10-CM | POA: Diagnosis not present

## 2022-06-05 DIAGNOSIS — K219 Gastro-esophageal reflux disease without esophagitis: Secondary | ICD-10-CM | POA: Diagnosis not present

## 2022-06-05 DIAGNOSIS — I441 Atrioventricular block, second degree: Secondary | ICD-10-CM | POA: Insufficient documentation

## 2022-06-05 HISTORY — PX: ATRIAL FIBRILLATION ABLATION: EP1191

## 2022-06-05 SURGERY — ATRIAL FIBRILLATION ABLATION
Anesthesia: General

## 2022-06-05 MED ORDER — ACETAMINOPHEN 325 MG PO TABS: 650.0000 mg | ORAL_TABLET | ORAL | Status: DC | PRN

## 2022-06-05 MED ORDER — SODIUM CHLORIDE 0.9 % IV SOLN: INTRAVENOUS | Status: DC

## 2022-06-05 MED ORDER — SODIUM CHLORIDE 0.9 % IV SOLN: 250.0000 mL | INTRAVENOUS | Status: DC | PRN

## 2022-06-05 MED ORDER — AMISULPRIDE (ANTIEMETIC) 5 MG/2ML IV SOLN: 10.0000 mg | Freq: Once | INTRAVENOUS | Status: DC | PRN

## 2022-06-05 MED ORDER — ONDANSETRON HCL 4 MG/2ML IJ SOLN: 4.0000 mg | Freq: Four times a day (QID) | INTRAMUSCULAR | Status: DC | PRN

## 2022-06-05 MED ORDER — SUGAMMADEX SODIUM 200 MG/2ML IV SOLN
INTRAVENOUS | Status: DC | PRN
  Administered 2022-06-05: 100 mg via INTRAVENOUS
  Administered 2022-06-05: 200 mg via INTRAVENOUS

## 2022-06-05 MED ORDER — ACETAMINOPHEN 500 MG PO TABS
1000.0000 mg | ORAL_TABLET | Freq: Once | ORAL | Status: AC
  Administered 2022-06-05: 1000 mg via ORAL
  Filled 2022-06-05: qty 2

## 2022-06-05 MED ORDER — PROPOFOL 10 MG/ML IV BOLUS
INTRAVENOUS | Status: DC | PRN
  Administered 2022-06-05: 150 mg via INTRAVENOUS

## 2022-06-05 MED ORDER — LIDOCAINE 2% (20 MG/ML) 5 ML SYRINGE
INTRAMUSCULAR | Status: DC | PRN
  Administered 2022-06-05: 80 mg via INTRAVENOUS

## 2022-06-05 MED ORDER — PHENYLEPHRINE HCL-NACL 20-0.9 MG/250ML-% IV SOLN
INTRAVENOUS | Status: DC | PRN
  Administered 2022-06-05: 20 ug/min via INTRAVENOUS

## 2022-06-05 MED ORDER — FENTANYL CITRATE (PF) 100 MCG/2ML IJ SOLN: 25.0000 ug | INTRAMUSCULAR | Status: DC | PRN

## 2022-06-05 MED ORDER — HEPARIN (PORCINE) IN NACL 1000-0.9 UT/500ML-% IV SOLN
INTRAVENOUS | Status: DC | PRN
  Administered 2022-06-05 (×2): 500 mL

## 2022-06-05 MED ORDER — HEPARIN SODIUM (PORCINE) 1000 UNIT/ML IJ SOLN
INTRAMUSCULAR | Status: DC | PRN
  Administered 2022-06-05: 1000 [IU] via INTRAVENOUS

## 2022-06-05 MED ORDER — ONDANSETRON HCL 4 MG/2ML IJ SOLN
INTRAMUSCULAR | Status: DC | PRN
  Administered 2022-06-05: 4 mg via INTRAVENOUS

## 2022-06-05 MED ORDER — PROMETHAZINE HCL 25 MG/ML IJ SOLN: 6.2500 mg | INTRAMUSCULAR | Status: DC | PRN

## 2022-06-05 MED ORDER — APIXABAN 5 MG PO TABS
5.0000 mg | ORAL_TABLET | Freq: Two times a day (BID) | ORAL | Status: DC
  Administered 2022-06-05: 5 mg via ORAL
  Filled 2022-06-05: qty 1

## 2022-06-05 MED ORDER — SODIUM CHLORIDE 0.9% FLUSH: 3.0000 mL | INTRAVENOUS | Status: DC | PRN

## 2022-06-05 MED ORDER — ROCURONIUM BROMIDE 10 MG/ML (PF) SYRINGE
PREFILLED_SYRINGE | INTRAVENOUS | Status: DC | PRN
  Administered 2022-06-05: 80 mg via INTRAVENOUS

## 2022-06-05 MED ORDER — SODIUM CHLORIDE 0.9% FLUSH: 3.0000 mL | Freq: Two times a day (BID) | INTRAVENOUS | Status: DC

## 2022-06-05 SURGICAL SUPPLY — 20 items
BAG SNAP BAND KOVER 36X36 (MISCELLANEOUS) IMPLANT
BLANKET WARM UNDERBOD FULL ACC (MISCELLANEOUS) ×1 IMPLANT
CATH ABLAT QDOT MICRO BI TC FJ (CATHETERS) IMPLANT
CATH JOSEPH QUAD ALLRED 6F REP (CATHETERS) IMPLANT
CATH OCTARAY 2.0 F 3-3-3-3-3 (CATHETERS) IMPLANT
CATH S-M CIRCA TEMP PROBE (CATHETERS) IMPLANT
CATH SOUNDSTAR ECO 8FR (CATHETERS) IMPLANT
CATH WEB BI DIR CSDF CRV REPRO (CATHETERS) IMPLANT
CLOSURE PERCLOSE PROSTYLE (VASCULAR PRODUCTS) IMPLANT
COVER SWIFTLINK CONNECTOR (BAG) ×1 IMPLANT
PACK EP LATEX FREE (CUSTOM PROCEDURE TRAY) ×1
PACK EP LF (CUSTOM PROCEDURE TRAY) ×1 IMPLANT
PAD DEFIB RADIO PHYSIO CONN (PAD) ×1 IMPLANT
PATCH CARTO3 (PAD) IMPLANT
SHEATH BAYLIS TRANSSEPTAL 98CM (NEEDLE) IMPLANT
SHEATH CARTO VIZIGO SM CVD (SHEATH) IMPLANT
SHEATH PINNACLE 8F 10CM (SHEATH) IMPLANT
SHEATH PINNACLE 9F 10CM (SHEATH) IMPLANT
SHEATH PROBE COVER 6X72 (BAG) IMPLANT
TUBING SMART ABLATE COOLFLOW (TUBING) IMPLANT

## 2022-06-05 NOTE — Transfer of Care (Signed)
Immediate Anesthesia Transfer of Care Note  Patient: Julie Duncan  Procedure(s) Performed: ATRIAL FIBRILLATION ABLATION  Patient Location: Cath Lab  Anesthesia Type:General  Level of Consciousness: awake  Airway & Oxygen Therapy: Patient Spontanous Breathing and Patient connected to nasal cannula oxygen  Post-op Assessment: Report given to RN and Post -op Vital signs reviewed and stable  Post vital signs: Reviewed and stable  Last Vitals:  Vitals Value Taken Time  BP 156/78 06/05/22 0919  Temp    Pulse 72 06/05/22 0921  Resp 21 06/05/22 0921  SpO2 99 % 06/05/22 0921  Vitals shown include unvalidated device data.  Last Pain:  Vitals:   06/05/22 0920  TempSrc:   PainSc: 0-No pain         Complications:  Encounter Notable Events  Notable Event Outcome Phase Comment  Difficult to intubate - expected  Intraprocedure Filed from anesthesia note documentation.

## 2022-06-05 NOTE — Anesthesia Procedure Notes (Addendum)
Procedure Name: Intubation Date/Time: 06/05/2022 7:49 AM  Performed by: Erick Colace, CRNAPre-anesthesia Checklist: Patient identified, Emergency Drugs available, Suction available and Patient being monitored Patient Re-evaluated:Patient Re-evaluated prior to induction Oxygen Delivery Method: Circle system utilized Preoxygenation: Pre-oxygenation with 100% oxygen Induction Type: IV induction Ventilation: Mask ventilation without difficulty and Oral airway inserted - appropriate to patient size Laryngoscope Size: Glidescope and 3 Grade View: Grade I Tube type: Oral Tube size: 7.0 mm Number of attempts: 1 Airway Equipment and Method: Stylet and Oral airway Placement Confirmation: ETT inserted through vocal cords under direct vision, positive ETCO2 and breath sounds checked- equal and bilateral Secured at: 21 cm Tube secured with: Tape Dental Injury: Teeth and Oropharynx as per pre-operative assessment  Difficulty Due To: Difficulty was anticipated and Difficult Airway- due to anterior larynx Comments: Elective glidescope due to previous Grade 4 view with MAC 3 blade.

## 2022-06-05 NOTE — H&P (Addendum)
Electrophysiology Office Follow up Visit Note:     Date:  06/05/2022    ID:  Julie Duncan, DOB [birth year], MRN [id number]   PCP:  Marinda Elk, MD        [facility] Cardiologist:  Nelva Bush, MD  [facility] Electrophysiologist:  Vickie Epley, MD      Interval History:     Julie Duncan is a 81 y.o. female who presents for a follow up visit. They were last seen in clinic October 15, 2021 for atrial flutter.  The patient was previously seen by Dr. Lovena Le and had an EP study and ablation on July 30, 2020.  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  After that procedure she had recurrence of arrhythmia with an EKG consistent with typical atrial flutter.  At her last appointment, EP study and ablation of typical atrial flutter was recommended.  From reviewing the records, the procedure was scheduled for December 11, 2021 but the patient ultimately canceled.    The patient saw Dr. Saunders Revel January 08, 2022 for follow-up.  At that appointment she reported tachycardia with minimal exertion.  Given her history of second-degree AV block, her medications were not uptitrated.  After that appointment she sent a message via MyChart reporting shortness of breath and feel like her heart was "pounding".  She was scheduled for follow-up with me to discuss alternative treatment strategies.   She saw Dr. Saunders Revel in follow-up again on March 12, 2022.  At that appointment she again discussed her rapid heart rates and dyspnea with exertion.  She was more interested in pursuing catheter ablation at that appointment.   Today she presents for EP study and ablation of her flutter. Will plan to assess for any other inducible arrhythmias today.   Objective      Past Medical History:  Diagnosis Date   Atrial flutter ([place])     Atrial myxoma      a. 2016 s/p resection ([facility]); b. 04/2017 Echo: EF 60-65%, no rwma, mild AI/MR. Mild-mod TR. Nl PASP; c. 07/2021 Echo: EF 60-65%, no rwma, nl RV  fxn, mild MR/AI. Ao sclerosis.   History of stress test      a. 04/2017 ETT: Poor ex capacity. No ischemia. Episode of PSVT noted.   Hyperlipidemia     Hypertension     Mobitz I     Mobitz II     Paroxysmal atrial fibrillation (HCC)      a. CHA2DS2VASc = 4.  Did not tolerate Warfarin. Difficulty in affording DOAC. Eliquis added 07/2021 (new Aflutter).   Paroxysmal SVT (supraventricular tachycardia)      a. 06/2020 Zio: 3813 SVT runs - longest 73m47s, max rate 231; b. 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           Past Surgical History:  Procedure Laterality Date   ANKLE SURGERY Left     CARDIOVERSION N/A 08/26/2021    Procedure: CARDIOVERSION;  Surgeon: ENelva Bush MD;  Location: [facility] ORS;  Service: Cardiovascular;  Laterality: N/A;   EXCISION OF ATRIAL MYXOMA       PARTIAL HYSTERECTOMY       SVT ABLATION N/A 07/30/2020    Procedure: SVT ABLATION;  Surgeon: TEvans Lance MD;  [facility];  Service: Cardiovascular;  Laterality: N/A;      Current Medications: Active Medications      Current Meds  Medication Sig   apixaban (ELIQUIS) 5 MG TABS  tablet Take 1 tablet (5 mg total) by mouth 2 (two) times daily.   ascorbic acid (VITAMIN C) 500 MG tablet Take 500 mg by mouth daily.   Cholecalciferol (VITAMIN D PO) Take by mouth daily.   Coenzyme Q10 10 MG capsule Take by mouth daily.   cyanocobalamin 50 MCG tablet Take by mouth daily.   Ginkgo Biloba 40 MG TABS Take 1 tablet by mouth daily.   hydrochlorothiazide (HYDRODIURIL) 25 MG tablet Take 1 tablet (25 mg total) by mouth daily.   losartan (COZAAR) 50 MG tablet Take 1 tablet by mouth twice daily   OVER THE COUNTER MEDICATION Take 2 tablets by mouth daily. Total beets   VITAMIN E PO Take by mouth daily.        Allergies:   Cortisone, Amlodipine, Red yeast rice  [cholestin], Saccharin, and Warfarin and related    Social History         Socioeconomic History   Marital status: Married      Spouse name: Not on  file   Number of children: Not on file   Years of education: Not on file   Highest education level: Not on file  Occupational History   Not on file  Tobacco Use   Smoking status: Never   Smokeless tobacco: Never  Vaping Use   Vaping Use: Never used  Substance and Sexual Activity   Alcohol use: No   Drug use: No   Sexual activity: Not on file  Other Topics Concern   Not on file  Social History Narrative    Lives alone.     Social Determinants of Health    Financial Resource Strain: Not on file  Food Insecurity: Not on file  Transportation Needs: Not on file  Physical Activity: Not on file  Stress: Not on file  Social Connections: Not on file      Family History: The patient's family history includes Cancer in her brother and father; Cancer (age of onset: 75) in her sister; Heart attack (age of onset: 5) in her brother; Heart attack (age of onset: 66) in her father; Stroke (age of onset: 42) in her mother.   ROS:   Please see the history of present illness.    All other systems reviewed and are negative.   EKGs/Labs/Other Studies Reviewed:     The following studies were reviewed today:   March 12, 2022 EKG shows typical appearing atrial flutter       Recent Labs: 07/29/2021: Hemoglobin 13.2; Magnesium 2.3; Platelets 317; TSH 1.160 02/09/2022: BUN 29; Creatinine, Ser 0.94; Potassium 4.2; Sodium 142  Recent Lipid Panel Labs (Brief)          Component Value Date/Time    CHOL 257 (H) 06/17/2010 1010    TRIG 185.0 (H) 06/17/2010 1010    HDL 46.60 06/17/2010 1010    CHOLHDL 6 06/17/2010 1010    VLDL 37.0 06/17/2010 1010    LDLCALC 179 01/21/2009 0000    LDLCALC 179 01/21/2009 0000    LDLCALC 179 01/21/2009 0000    LDLDIRECT 183.0 06/17/2010 1010        Physical Exam:     VS:  BP 158/63   Pulse 74   Ht '5\' 4"'$  (1.626 m)   Wt 183 lb (83 kg)   SpO2 98%   BMI 31.41 kg/m         Wt Readings from Last 3 Encounters:  03/25/22 183 lb (83 kg)  03/12/22 184  lb (83.5 kg)  01/08/22 183 lb (83 kg)      GEN:  Well nourished, well developed in no acute distress. Elderly. HEENT: Normal NECK: No JVD; No carotid bruits LYMPHATICS: No lymphadenopathy CARDIAC: RRR, no murmurs, rubs, gallops RESPIRATORY:  Clear to auscultation without rales, wheezing or rhonchi  ABDOMEN: Soft, non-tender, non-distended MUSCULOSKELETAL:  No edema; No deformity  SKIN: Warm and dry NEUROLOGIC:  Alert and oriented x 3 PSYCHIATRIC:  Normal affect            Assessment ASSESSMENT:     1. Typical atrial flutter ([place])   2. AV block     PLAN:     In order of problems listed above:   #Atrial flutter Typical appearing although she has a history of atrial myxoma resection raising the possibility of a incisional flutter.  Her history of conduction system disease limits her ability to use antiarrhythmic drugs.  I have previously discussed catheter ablation for atrial flutter in detail with the patient.  We discussed the procedural details, the risks and possibility that she would go on to develop atrial fibrillation in the future.     Discussed treatment options today for her atrial arrhythmia including antiarrhythmic drug therapy and ablation. Discussed risks, recovery and likelihood of success. Discussed potential need for repeat ablation procedures and antiarrhythmic drugs after an initial ablation. They wish to proceed with scheduling.   Risk, benefits, and alternatives to EP study and radiofrequency ablation for afib and flutter and SVT were also discussed in detail today. These risks include but are not limited to stroke, bleeding, vascular damage, tamponade, perforation, damage to the esophagus, lungs, and other structures, AV nodal injury requiring pacemaker implant, pulmonary vein stenosis, worsening renal function, and death. The patient understands these risk and wishes to proceed.  We will therefore proceed with catheter ablation at the next available time.   Carto, ICE, anesthesia are requested for the procedure.  Will also obtain CT PV protocol prior to the procedure to exclude LAA thrombus and further evaluate atrial anatomy.  Plan for CTI ablation +/- PVI, SVT and atypical flutter ablations.   We did discuss the risks of significant bradycardia after today's procedure given her history of Mobitz I. We discussed that there was a chance of bradycardia requiring pacemaker after terminating the atrial flutter. She understands this risk and wishes to proceed given how bad she has felt while in atrial flutter. I think this is very reasonable.    Signed, Lars Mage, MD, [facility], [facility] 06/05/2022 Electrophysiology [facility]

## 2022-06-05 NOTE — Discharge Instructions (Signed)

## 2022-06-05 NOTE — Progress Notes (Signed)
Post ambulation done, pt tolerated well. No bleeding or hematoma noted to sites bilat

## 2022-06-05 NOTE — Anesthesia Postprocedure Evaluation (Signed)
Anesthesia Post Note  Patient: Julie Duncan  Procedure(s) Performed: ATRIAL FIBRILLATION ABLATION     Patient location during evaluation: PACU Anesthesia Type: General Level of consciousness: sedated Pain management: pain level controlled Vital Signs Assessment: post-procedure vital signs reviewed and stable Respiratory status: spontaneous breathing and respiratory function stable Cardiovascular status: stable Postop Assessment: no apparent nausea or vomiting Anesthetic complications: yes  Encounter Notable Events  Notable Event Outcome Phase Comment  Difficult to intubate - expected  Intraprocedure Filed from anesthesia note documentation.    Last Vitals:  Vitals:   06/05/22 0950 06/05/22 0956  BP:  (!) 164/45  Pulse: (!) 59 (!) 59  Resp: 11 (!) 7  Temp: 36.8 C   SpO2: 98% 98%    Last Pain:  Vitals:   06/05/22 0920  TempSrc:   PainSc: 0-No pain                 Auston Halfmann DANIEL

## 2022-06-08 ENCOUNTER — Encounter (HOSPITAL_COMMUNITY): Payer: Self-pay | Admitting: Cardiology

## 2022-06-18 ENCOUNTER — Other Ambulatory Visit: Payer: Self-pay | Admitting: Internal Medicine

## 2022-06-25 ENCOUNTER — Encounter: Payer: Self-pay | Admitting: Internal Medicine

## 2022-06-25 ENCOUNTER — Ambulatory Visit: Payer: PPO | Attending: Internal Medicine | Admitting: Internal Medicine

## 2022-06-25 ENCOUNTER — Other Ambulatory Visit
Admission: RE | Admit: 2022-06-25 | Discharge: 2022-06-25 | Disposition: A | Discharge status: Home / Self Care | Payer: PPO | Attending: Internal Medicine | Admitting: Internal Medicine

## 2022-06-25 ENCOUNTER — Ambulatory Visit (INDEPENDENT_AMBULATORY_CARE_PROVIDER_SITE_OTHER): Payer: PPO

## 2022-06-25 VITALS — BP 100/64 | HR 105 | Ht 66.0 in | Wt 180.0 lb

## 2022-06-25 DIAGNOSIS — Z79899 Other long term (current) drug therapy: Secondary | ICD-10-CM

## 2022-06-25 DIAGNOSIS — I3139 Other pericardial effusion (noninflammatory): Secondary | ICD-10-CM

## 2022-06-25 LAB — BASIC METABOLIC PANEL
Anion gap: 8 (ref 5–15)
BUN: 27 mg/dL — ABNORMAL HIGH (ref 8–23)
CO2: 30 mmol/L (ref 22–32)
Calcium: 9.2 mg/dL (ref 8.9–10.3)
Chloride: 99 mmol/L (ref 98–111)
Creatinine, Ser: 1 mg/dL (ref 0.44–1.00)
GFR, Estimated: 56 mL/min — ABNORMAL LOW (ref >60)
Glucose, Bld: 94 mg/dL (ref 70–99)
Potassium: 4 mmol/L (ref 3.5–5.1)
Sodium: 137 mmol/L (ref 135–145)

## 2022-06-25 LAB — CBC
HCT: 39.3 % (ref 36.0–46.0)
Hemoglobin: 12.8 g/dL (ref 12.0–15.0)
MCH: 30.5 pg (ref 26.0–34.0)
MCHC: 32.6 g/dL (ref 30.0–36.0)
MCV: 93.6 fL (ref 80.0–100.0)
Platelets: 331 10*3/uL (ref 150–400)
RBC: 4.2 MIL/uL (ref 3.87–5.11)
RDW: 12.6 % (ref 11.5–15.5)
WBC: 6.1 10*3/uL (ref 4.0–10.5)
nRBC: 0 % (ref 0.0–0.2)

## 2022-06-25 LAB — TSH: TSH: 1.038 u[IU]/mL (ref 0.350–4.500)

## 2022-06-25 MED ORDER — LOSARTAN POTASSIUM 50 MG PO TABS: 50.0000 mg | ORAL_TABLET | Freq: Every day | ORAL | 3 refills | Status: DC

## 2022-06-25 NOTE — Progress Notes (Signed)
Follow-up Outpatient Visit Date: 06/25/2022  Primary Care Provider: Venia Carbon, MD [place] [address]  Chief Complaint: Follow-up atrial fibrillation/flutter and shortness of breath  HPI:  Julie Duncan is a 82 y.o. female with history of  atrial fibrillation/flutter, SVT status post ablation, Mobitz type I second-degree AV block, atrial myxoma status post resection (11/452 at [facility]) complicated by postoperative atrial fibrillation, nonobstructive coronary artery disease by recent catheterization (03/2022), hypertension, and osteoarthritis, who presents for follow-up of atrial flutter and heart block.  I last saw her in October, at which time she remained quite fatigued.  Preceding catheterization had shown nonobstructive CAD and normal LVEDP.  It was suspected that her atrial flutter was driving her symptoms; she underwent successful flutter ablation with Dr. Quentin Ore last month.  Today, Julie Duncan reports that she does not feel any better following her flutter ablation.  She still gets out of breath easily with activity.  She has not had any chest pain, palpitations, lightheadedness, or edema.  Her bilateral groin puncture sites have healed.  She stopped taking apixaban 2 days ago, as she felt like it would be better to take aspirin alone.  She remains on twice daily losartan as well as once daily HCTZ.  Blood pressures are typically normal to borderline elevated, having last been checked at home about a week ago.  --------------------------------------------------------------------------------------------------  Past Medical History:  Diagnosis Date   Atrial flutter ([place])    Atrial myxoma    a. 2016 s/p resection ([facility]); b. 04/2017 Echo: EF 60-65%, no rwma, mild AI/MR. Mild-mod TR. Nl PASP; c. 07/2021 Echo: EF 60-65%, no rwma, nl RV fxn, mild MR/AI. Ao sclerosis.   Coronary artery disease 03/27/2022   50% mid LAD stenosis (not significant by iFR)   History of stress  test    a. 04/2017 ETT: Poor ex capacity. No ischemia. Episode of PSVT noted.   Hyperlipidemia    Hypertension    Mobitz I    Mobitz II    Paroxysmal atrial fibrillation (HCC)    a. CHA2DS2VASc = 4.  Did not tolerate Warfarin. Difficulty in affording DOAC. Eliquis added 07/2021 (new Aflutter).   Paroxysmal SVT (supraventricular tachycardia)    a. 06/2020 Zio: 3813 SVT runs - longest 40m47s, max rate 231; b. 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   Past Surgical History:  Procedure Laterality Date   ANKLE SURGERY Left    ATRIAL FIBRILLATION ABLATION N/A 06/05/2022   Procedure: ATRIAL FIBRILLATION ABLATION;  Surgeon: LVickie Epley MD;  [facility];  Service: Cardiovascular;  Laterality: N/A;   CARDIOVERSION N/A 08/26/2021   Procedure: CARDIOVERSION;  Surgeon: ENelva Bush MD;  Location: [facility] ORS;  Service: Cardiovascular;  Laterality: N/A;   EXCISION OF ATRIAL MYXOMA     INTRAVASCULAR PRESSURE WIRE/FFR STUDY N/A 03/27/2022   Procedure: INTRAVASCULAR PRESSURE WIRE/FFR STUDY;  Surgeon: ENelva Bush MD;  [facility];  Service: Cardiovascular;  Laterality: N/A;   LEFT HEART CATH AND CORONARY ANGIOGRAPHY N/A 03/27/2022   Procedure: LEFT HEART CATH AND CORONARY ANGIOGRAPHY;  Surgeon: ENelva Bush MD;  [facility];  Service: Cardiovascular;  Laterality: N/A;   PARTIAL HYSTERECTOMY     SVT ABLATION N/A 07/30/2020   Procedure: SVT ABLATION;  Surgeon: TEvans Lance MD;  [facility];  Service: Cardiovascular;  Laterality: N/A;   WRIST SURGERY Right     Current Meds  Medication Sig  ascorbic acid (VITAMIN C) 500 MG tablet Take 500 mg by mouth daily.   aspirin EC 81 MG tablet Take 81 mg by mouth 2 (two) times daily. Swallow whole.   Cholecalciferol (VITAMIN D PO) Take by mouth daily.   Coenzyme Q10 10 MG capsule Take by mouth daily.   cyanocobalamin 50 MCG tablet Take by mouth daily.   Ginkgo Biloba 40 MG  TABS Take 1 tablet by mouth daily.   hydrochlorothiazide (HYDRODIURIL) 25 MG tablet Take 1 tablet (25 mg total) by mouth daily.   lactose free nutrition (BOOST) LIQD Take 237 mLs by mouth daily with supper.   losartan (COZAAR) 50 MG tablet Take 1 tablet by mouth twice daily   OVER THE COUNTER MEDICATION Take 2 tablets by mouth daily. Total beets   VITAMIN E PO Take by mouth daily.    Allergies: Cortisone, Amlodipine, Red yeast rice  [cholestin], Saccharin, and Warfarin and related  Social History   Tobacco Use   Smoking status: Never    Passive exposure: Past   Smokeless tobacco: Never  Vaping Use   Vaping Use: Never used  Substance Use Topics   Alcohol use: No   Drug use: No    Family History  Problem Relation Age of Onset   Stroke Mother 56   Heart attack Father 81   Cancer Father        throat   Cancer Sister 13       lung   Cancer Brother        lung   Heart attack Brother 19    Review of Systems: A 12-system review of systems was performed and was negative except as noted in the HPI.  --------------------------------------------------------------------------------------------------  Physical Exam: BP 100/64 (BP Location: Left Arm, Patient Position: Sitting, Cuff Size: Large)   Pulse (!) 105   Ht '5\' 6"'$  (1.676 m)   Wt 180 lb (81.6 kg)   SpO2 98%   BMI 29.05 kg/m  Repeat BP: 106/62  General:  NAD. Neck: No JVD or HJR. Lungs: Clear to auscultation bilaterally without wheezes or crackles. Heart: Regular rate and rhythm with occasional brief pauses.  No murmurs, rubs, or gallops. Abdomen: Soft, nontender, nondistended. Extremities: No lower extremity edema.  Bilateral femoral venotomy sites well-healed.  No hematomas or bruits appreciated.  EKG: Sinus tachycardia with blocked PAC, left axis deviation, and poor R wave progression.  Lab Results  Component Value Date   WBC 5.8 05/11/2022   HGB 12.5 05/11/2022   HCT 39.3 05/11/2022   MCV 92.7 05/11/2022    PLT 339 05/11/2022    Lab Results  Component Value Date   NA 143 05/11/2022   K 3.8 05/11/2022   CL 105 05/11/2022   CO2 29 05/11/2022   BUN 20 05/11/2022   CREATININE 1.04 (H) 05/11/2022   GLUCOSE 91 05/11/2022   ALT 13 04/15/2022    Lab Results  Component Value Date   CHOL 257 (H) 06/17/2010   HDL 46.60 06/17/2010   LDLCALC 179 01/21/2009   LDLCALC 179 01/21/2009   LDLCALC 179 01/21/2009   LDLDIRECT 183.0 06/17/2010   TRIG 185.0 (H) 06/17/2010   CHOLHDL 6 06/17/2010    --------------------------------------------------------------------------------------------------  ASSESSMENT AND PLAN: Sinus tachycardia, atrial flutter, and Mobitz type I second-degree AV block: EKG today shows sinus tachycardia with likely blocked PAC.  There appears to be some gradual lengthening of the PR interval suggesting that she may still have some degree of Wenkebach.  Limited bedside echocardiogram was performed  today in the setting of her unusually soft blood pressure.  There was question small pericardial effusion.  LVEF appears preserved.  We will therefore obtain a formal limited echo today to exclude pericardial effusion.  I will also check a CBC, BMP, and TSH.  If CBC is stable, Julie Duncan will need to resume apixaban to complete at least 4 weeks of therapeutic anticoagulation from the time of her flutter ablation on 06/05/2022.  She already has follow-up scheduled with the atrial fibrillation clinic next week.  I am reluctant to add any rate controlling agents, even with her borderline tachycardia today, given history of second-degree AV block.  Dyspnea on exertion: This is unfortunately did not improve with flutter ablation.  Julie Duncan was evaluated by pulmonology in November, with subsequent high-resolution CT chest showing absence of interstitial lung disease.  Some air trapping was noted.  Recommend follow-up with pulmonology to discuss further workup/treatment.  Hypertension: Blood pressure  borderline low today, which is somewhat unusual for Julie Duncan.  As above, we will obtain a formal limited echo to exclude pericardial effusion in the setting of recent atrial flutter ablation.  I will also check a CBC, BMP, TSH.  If echo and labs are unrevealing, we will defer further testing and instead have Julie Duncan discontinue HCTZ and decrease losartan to 50 mg daily.  I have asked her to monitor her blood pressure daily at home and to bring a record of her readings with her to her follow-up visit in the atrial fibrillation clinic next week.  Nonobstructive coronary artery disease: Catheterization in October showed nonobstructive CAD.  She should be on apixaban and lieu of aspirin in the setting of recent atrial flutter.  She was also previously on rosuvastatin for target LDL less than 100 (ideally below 70).  However, it appears this has fallen off her medication list.  I would favor having her restart this if aforementioned labs are unrevealing to prevent progression of disease.  Follow-up: Return to clinic in 4 months.  Nelva Bush, MD 06/25/2022 10:42 AM

## 2022-06-25 NOTE — Patient Instructions (Addendum)
Medication Instructions:  Your physician recommends the following medication changes.  STOP TAKING: Hydrochlorothiazide 25 mg daily  DECREASE: Losartan to 50 mg by mouth once daily  *If you need a refill on your cardiac medications before your next appointment, please call your pharmacy*   Lab Work: Your provider would like for you to have following labs drawn: (CBC, BMP, TSH).   Please go to the [facility] entrance and check in at the front desk.  You do not need an appointment.  They are open from 7am-6 pm.   If you have labs (blood work) drawn today and your tests are completely normal, you will receive your results only by: [place] (if you have MyChart) OR A paper copy in the mail If you have any lab test that is abnormal or we need to change your treatment, we will call you to review the results.   Testing/Procedures: Your physician has requested that you have an limited echocardiogram. Echocardiography is a painless test that uses sound waves to create images of your heart. It provides your doctor with information about the size and shape of your heart and how well your heart's chambers and valves are working.   You may receive an ultrasound enhancing agent through an IV if needed to better visualize your heart during the echo. This procedure takes approximately one hour.  There are no restrictions for this procedure.  This will take place at [place] ([address] [id number], [place]    Follow-Up: At [facility], you and your health needs are our priority.  As part of our continuing mission to provide you with exceptional heart care, we have created designated Provider Care Teams.  These Care Teams include your primary Cardiologist (physician) and Advanced Practice Providers (APPs -  Physician Assistants and Nurse Practitioners) who all work together to provide you with the care you need, when you need it.  We recommend  signing up for the patient portal called "MyChart".  Sign up information is provided on this After Visit Summary.  MyChart is used to connect with patients for Virtual Visits (Telemedicine).  Patients are able to view lab/test results, encounter notes, upcoming appointments, etc.  Non-urgent messages can be sent to your provider as well.   To learn more about what you can do with MyChart, go to [email or web address].    Your next appointment:   4 month(s)  The format for your next appointment:   In Person  Provider:   You may see Nelva Bush, MD or one of the following Advanced Practice Providers on your designated Care Team:   Murray Hodgkins, NP Christell Faith, PA-C Cadence Kathlen Mody, PA-C Gerrie Nordmann, NP    Dr. Saunders Revel recommends you check your blood pressure daily and keep a log.

## 2022-06-26 ENCOUNTER — Telehealth: Payer: Self-pay | Admitting: Internal Medicine

## 2022-06-26 LAB — ECHOCARDIOGRAM LIMITED
Calc EF: 46.4 %
Height: 66 in
S' Lateral: 2.5 cm
Single Plane A2C EF: 51.4 %
Single Plane A4C EF: 45.5 %
Weight: 2880 oz

## 2022-06-26 MED ORDER — APIXABAN 5 MG PO TABS: 5.0000 mg | ORAL_TABLET | Freq: Two times a day (BID) | ORAL | 0 refills | Status: DC

## 2022-06-26 MED ORDER — ROSUVASTATIN CALCIUM 10 MG PO TABS: 10.0000 mg | ORAL_TABLET | Freq: Every day | ORAL | 3 refills | Status: DC

## 2022-06-26 NOTE — Telephone Encounter (Signed)
Pt made aware of lab results along with MD's recommendations.  Pt verbalized understanding. Med list updated    Nelva Bush, MD 06/25/2022  1:54 PM EST     Please let Ms. Guard know that her kidney function and electrolytes are stable.  Her thyroid function and blood counts are also still in the normal range.  No findings on her labs to explain her drop in blood pressure.  I recommend that Ms. Stretch hold HCTZ and decrease losartan to 50 mg daily, as discussed at today's office visit.  She should also stop taking aspirin and resume apixaban 5 mg twice daily until instructed differently by the atrial fibrillation clinic or Dr. Quentin Ore.  I also noticed that rosuvastatin is no longer on her medication list.  If she is no longer taking it, I suggest that she resume rosuvastatin 10 mg daily.

## 2022-06-26 NOTE — Telephone Encounter (Signed)
Patient is returning call to discuss lab results and she requested a call back on her mobile line at [phone]

## 2022-07-01 DIAGNOSIS — L570 Actinic keratosis: Secondary | ICD-10-CM | POA: Diagnosis not present

## 2022-07-01 DIAGNOSIS — L820 Inflamed seborrheic keratosis: Secondary | ICD-10-CM | POA: Diagnosis not present

## 2022-07-01 DIAGNOSIS — L538 Other specified erythematous conditions: Secondary | ICD-10-CM | POA: Diagnosis not present

## 2022-07-01 DIAGNOSIS — D0439 Carcinoma in situ of skin of other parts of face: Secondary | ICD-10-CM | POA: Diagnosis not present

## 2022-07-01 DIAGNOSIS — D485 Neoplasm of uncertain behavior of skin: Secondary | ICD-10-CM | POA: Diagnosis not present

## 2022-07-01 DIAGNOSIS — X32XXXA Exposure to sunlight, initial encounter: Secondary | ICD-10-CM | POA: Diagnosis not present

## 2022-07-01 DIAGNOSIS — L578 Other skin changes due to chronic exposure to nonionizing radiation: Secondary | ICD-10-CM | POA: Diagnosis not present

## 2022-07-03 ENCOUNTER — Ambulatory Visit (HOSPITAL_COMMUNITY)
Admission: RE | Admit: 2022-07-03 | Discharge: 2022-07-03 | Disposition: A | Discharge status: Home / Self Care | Payer: PPO | Source: Ambulatory Visit | Attending: Physician Assistant | Admitting: Physician Assistant

## 2022-07-03 VITALS — BP 142/66 | HR 90 | Ht 66.0 in | Wt 183.8 lb

## 2022-07-03 DIAGNOSIS — I483 Typical atrial flutter: Secondary | ICD-10-CM | POA: Diagnosis not present

## 2022-07-03 DIAGNOSIS — I10 Essential (primary) hypertension: Secondary | ICD-10-CM | POA: Diagnosis not present

## 2022-07-03 DIAGNOSIS — I441 Atrioventricular block, second degree: Secondary | ICD-10-CM | POA: Diagnosis not present

## 2022-07-03 DIAGNOSIS — E785 Hyperlipidemia, unspecified: Secondary | ICD-10-CM | POA: Insufficient documentation

## 2022-07-03 DIAGNOSIS — I471 Supraventricular tachycardia, unspecified: Secondary | ICD-10-CM | POA: Insufficient documentation

## 2022-07-03 DIAGNOSIS — Z7901 Long term (current) use of anticoagulants: Secondary | ICD-10-CM | POA: Insufficient documentation

## 2022-07-03 DIAGNOSIS — D6869 Other thrombophilia: Secondary | ICD-10-CM | POA: Insufficient documentation

## 2022-07-03 DIAGNOSIS — I2510 Atherosclerotic heart disease of native coronary artery without angina pectoris: Secondary | ICD-10-CM | POA: Insufficient documentation

## 2022-07-03 MED ORDER — LOSARTAN POTASSIUM 50 MG PO TABS: 50.0000 mg | ORAL_TABLET | Freq: Two times a day (BID) | ORAL | 1 refills | Status: DC

## 2022-07-03 NOTE — Patient Instructions (Signed)
Increase losartan to '50mg'$  twice a day

## 2022-07-03 NOTE — Progress Notes (Signed)
Primary Care Physician: Venia Carbon, MD Primary Cardiologist: Dr End Primary Electrophysiologist: Dr Quentin Ore Referring Physician: Dr Angela Cox is a 82 y.o. female with a history of atrial myxoma s/p resection 2016, CAD, HLD, HTN, 2nd degree AV block, AVNRT s/p ablation, atrial flutter who presents for follow up in the [facility]. Patient is on Eliquis for a CHADS2VASC score of 5. Patient is s/p flutter ablation with Dr Quentin Ore on 06/05/22. Patient reports that she "feels better overall". She is not having tachypalpitations. She denies chest pain, swallowing pain, or groin issues. She has noted her systolic BP has consistently been >140 since her medications were adjusted at her last visit with Dr End.   Today, she denies symptoms of palpitations, chest pain, shortness of breath, orthopnea, PND, lower extremity edema, dizziness, presyncope, syncope, snoring, daytime somnolence, bleeding, or neurologic sequela. The patient is tolerating medications without difficulties and is otherwise without complaint today.    Atrial Fibrillation Risk Factors:  she does not have symptoms or diagnosis of sleep apnea. she does not have a history of rheumatic fever.   she has a BMI of Body mass index is 29.67 kg/m.Marland Kitchen Filed Weights   07/03/22 1045  Weight: 83.4 kg    Family History  Problem Relation Age of Onset   Stroke Mother 41   Heart attack Father 88   Cancer Father        throat   Cancer Sister 47       lung   Cancer Brother        lung   Heart attack Brother 89     Atrial Fibrillation Management history:  Previous antiarrhythmic drugs: none Previous cardioversions: 08/26/21 Previous ablations: SVT 2022, CTI 06/05/22 CHADS2VASC score: 5 Anticoagulation history: Eliquis   Past Medical History:  Diagnosis Date   Atrial flutter ([place])    Atrial myxoma    a. 2016 s/p resection ([facility]); b. 04/2017 Echo: EF 60-65%, no rwma, mild AI/MR.  Mild-mod TR. Nl PASP; c. 07/2021 Echo: EF 60-65%, no rwma, nl RV fxn, mild MR/AI. Ao sclerosis.   Coronary artery disease 03/27/2022   50% mid LAD stenosis (not significant by iFR)   History of stress test    a. 04/2017 ETT: Poor ex capacity. No ischemia. Episode of PSVT noted.   Hyperlipidemia    Hypertension    Mobitz I    Mobitz II    Paroxysmal atrial fibrillation ([facility])    a. CHA2DS2VASc = 4.  Did not tolerate Warfarin. Difficulty in affording DOAC. Eliquis added 07/2021 (new Aflutter).   Paroxysmal SVT (supraventricular tachycardia)    a. 06/2020 Zio: 3813 SVT runs - longest 60m47s, max rate 231; b. 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   Past Surgical History:  Procedure Laterality Date   ANKLE SURGERY Left    ATRIAL FIBRILLATION ABLATION N/A 06/05/2022   Procedure: ATRIAL FIBRILLATION ABLATION;  Surgeon: LVickie Epley MD;  [facility];  Service: Cardiovascular;  Laterality: N/A;   CARDIOVERSION N/A 08/26/2021   Procedure: CARDIOVERSION;  Surgeon: ENelva Bush MD;  Location: [facility];  Service: Cardiovascular;  Laterality: N/A;   EXCISION OF ATRIAL MYXOMA     INTRAVASCULAR PRESSURE WIRE/FFR STUDY N/A 03/27/2022   Procedure: INTRAVASCULAR PRESSURE WIRE/FFR STUDY;  Surgeon: ENelva Bush MD;  [facility];  Service: Cardiovascular;  Laterality: N/A;   LEFT HEART CATH AND CORONARY ANGIOGRAPHY N/A 03/27/2022   Procedure: LEFT  HEART CATH AND CORONARY ANGIOGRAPHY;  Surgeon: Nelva Bush, MD;  Location: [facility];  Service: Cardiovascular;  Laterality: N/A;   PARTIAL HYSTERECTOMY     SVT ABLATION N/A 07/30/2020   Procedure: SVT ABLATION;  Surgeon: Evans Lance, MD;  Location: [facility];  Service: Cardiovascular;  Laterality: N/A;   WRIST SURGERY Right     Current Outpatient Medications  Medication Sig Dispense Refill   apixaban (ELIQUIS) 5 MG TABS tablet Take 1 tablet (5 mg total) by mouth 2 (two) times daily. 180  tablet 0   ascorbic acid (VITAMIN C) 500 MG tablet Take 500 mg by mouth daily.     Cholecalciferol (VITAMIN D PO) Take 1 tablet by mouth daily.     Coenzyme Q10 10 MG capsule Take 10 mg by mouth daily.     cyanocobalamin 50 MCG tablet Take 1 tablet by mouth daily.     Ginkgo Biloba 40 MG TABS Take 1 tablet by mouth daily.     lactose free nutrition (BOOST) LIQD Take 237 mLs by mouth daily with supper.     OVER THE COUNTER MEDICATION Take 2 tablets by mouth daily. Total beets     rosuvastatin (CRESTOR) 10 MG tablet Take 1 tablet (10 mg total) by mouth daily. 90 tablet 3   VITAMIN E PO Take 1 tablet by mouth daily.     losartan (COZAAR) 50 MG tablet Take 1 tablet (50 mg total) by mouth 2 (two) times daily. Dose decrease 180 tablet 1   No current facility-administered medications for this encounter.    Allergies  Allergen Reactions   Cortisone Other (See Comments)    Patient states cortisone injections have "messes with my heart".  Has had cortisone shots and each time causes "heart rhythm problems".   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 Has a cat.   Daughter in [place]. Son in [place]      Has living will   Daughter is health care POA   Would accept resuscitation   Not sure about tube feeds   Social  Determinants of Health   Financial Resource Strain: Not on file  Food Insecurity: Not on file  Transportation Needs: Not on file  Physical Activity: Not on file  Stress: Not on file  Social Connections: Not on file  Intimate Partner Violence: Not on file     ROS- All systems are reviewed and negative except as per the HPI above.  Physical Exam: Vitals:   07/03/22 1045  BP: (!) 142/66  Pulse: 90  Weight: 83.4 kg  Height: '5\' 6"'$  (1.676 m)    GEN- The patient is a well  appearing elderly female, alert and oriented x 3 today.   Head- normocephalic, atraumatic Eyes-  Sclera clear, conjunctiva pink Ears- hearing intact Oropharynx- clear Neck- supple  Lungs- Clear to ausculation bilaterally, normal work of breathing Heart- Regular rate and rhythm, no murmurs, rubs or gallops  GI- soft, NT, ND, + BS Extremities- no clubbing, cyanosis, or edema MS- no significant deformity or atrophy Skin- no rash or lesion Psych- euthymic mood, full affect Neuro- strength and sensation are intact  Wt Readings from Last 3 Encounters:  07/03/22 83.4 kg  06/25/22 81.6 kg  06/05/22 83 kg    EKG from 06/25/22 demonstrates  Sinus tachycardia, block PAC  Echo 06/25/22 demonstrated   1. Left ventricular ejection fraction, by estimation, is 50 to 55%. The  left ventricle has low normal function. Left ventricular endocardial  border not optimally defined to evaluate regional wall motion. There is  mild left ventricular hypertrophy.   2. Right ventricular systolic function is normal. The right ventricular  size is normal.   3. The mitral valve is degenerative. Mild mitral valve regurgitation.  There is mild prolapse of of the mitral valve.   4. Aortic valve regurgitation is mild.   5. The inferior vena cava is normal in size with greater than 50%  respiratory variability, suggesting right atrial pressure of 3 mmHg.   Epic records are reviewed at length today  CHA2DS2-VASc Score = 5  The  patient's score is based upon: CHF History: 0 HTN History: 1 Diabetes History: 0 Stroke History: 0 Vascular Disease History: 1 Age Score: 2 Gender Score: 1       ASSESSMENT AND PLAN: 1. Typical atrial flutter The patient's CHA2DS2-VASc score is 5, indicating a 7.2% annual risk of stroke.   S/p flutter ablation 06/05/22 Patient appears to be maintaining SR. Continue Eliquis 5 mg BID We discussed options for long term arrhythmia monitoring including ILR and wearable device technology.   2. Secondary Hypercoagulable State (ICD10:  D68.69) The patient is at significant risk for stroke/thromboembolism based upon her CHA2DS2-VASc Score of 5.  Continue Apixaban (Eliquis).   3. SVT S/p ablation 2022  4. HTN Has been elevated at home with systolic readings consistently >140 Will have her increase her losartan back to 50 mg BID. Will continue to hold HCTZ. She will continue to keep BP log for review.   5. 2nd degree AV block Mobitz I Avoiding AV nodal agents.    Follow up with Dr Quentin Ore as scheduled.    [facility] [address] [place], [place] [address] [phone] 07/03/2022 12:54 PM

## 2022-07-07 IMAGING — MG MM DIGITAL SCREENING BILAT W/ TOMO AND CAD
6 of 12 series · 6 of 36 positions shown · non-contrast
Comparison: Previous exam(s).

CLINICAL DATA: Screening.

EXAM:
DIGITAL SCREENING BILATERAL MAMMOGRAM WITH TOMOSYNTHESIS AND CAD
TECHNIQUE: Bilateral screening digital craniocaudal and mediolateral oblique
mammograms were obtained. Bilateral screening digital breast
tomosynthesis was performed. The images were evaluated with
computer-aided detection.

[R MLO synth-2D (1 of 2)]
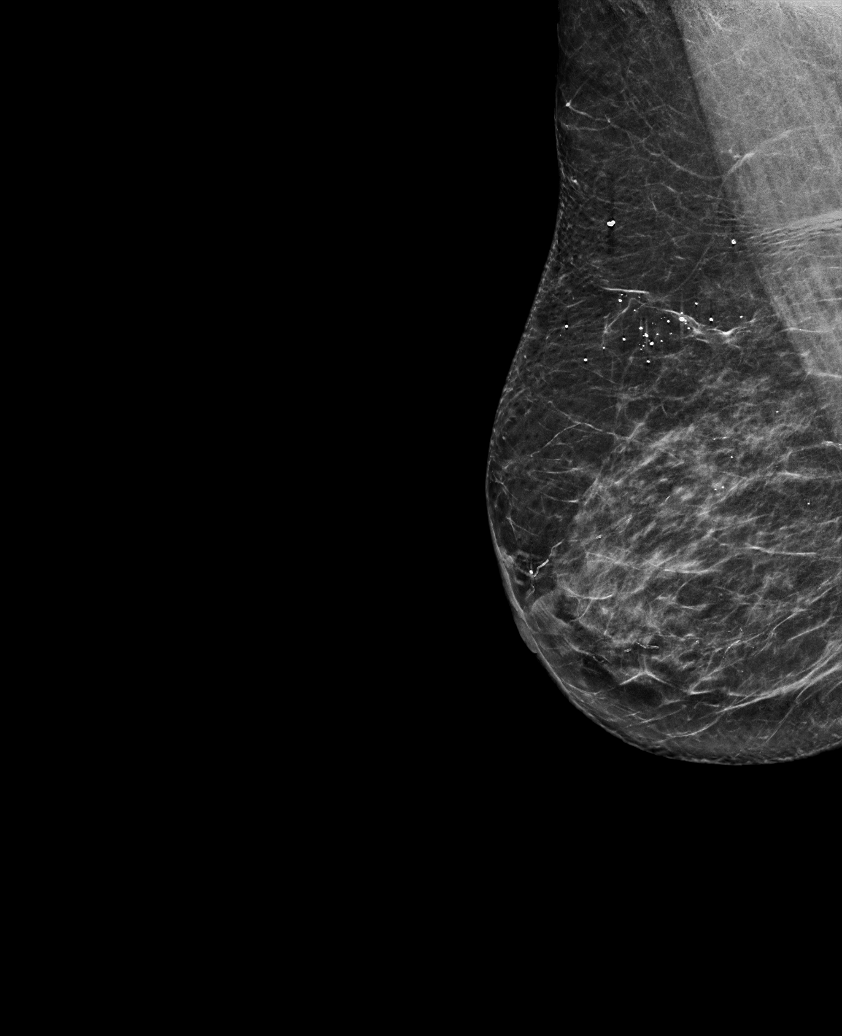

[L MLO synth-2D (1 of 2)]
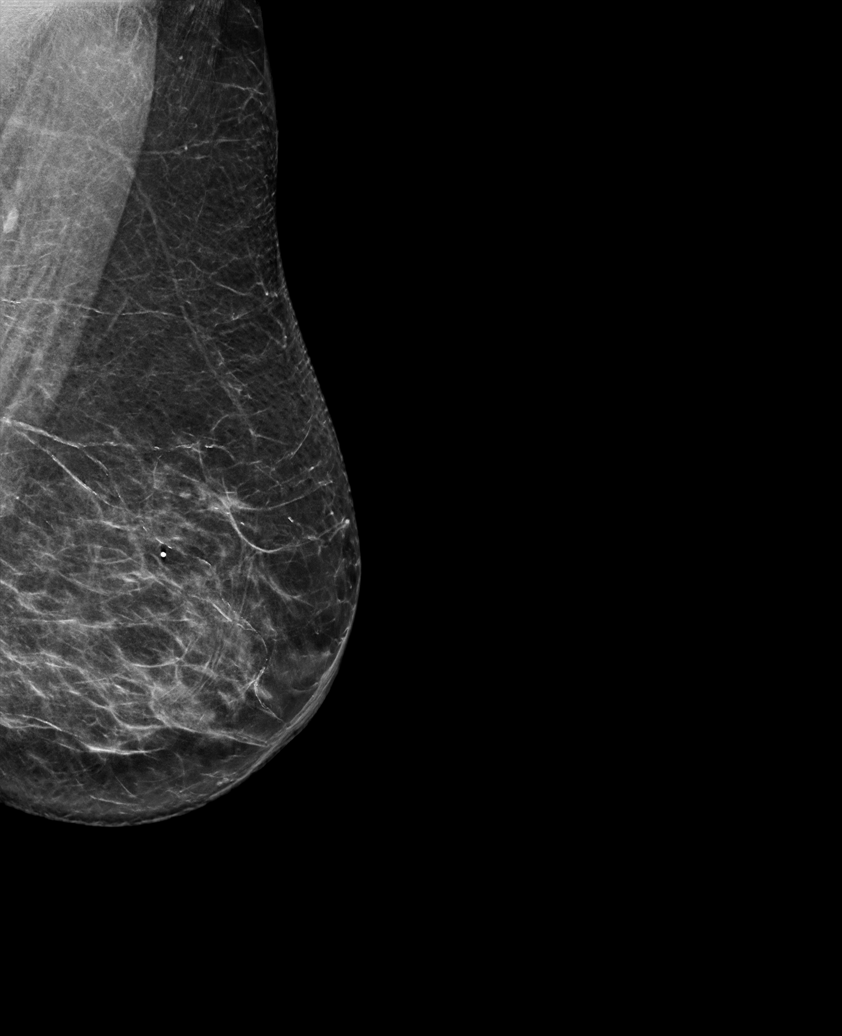

[R CC synth-2D]
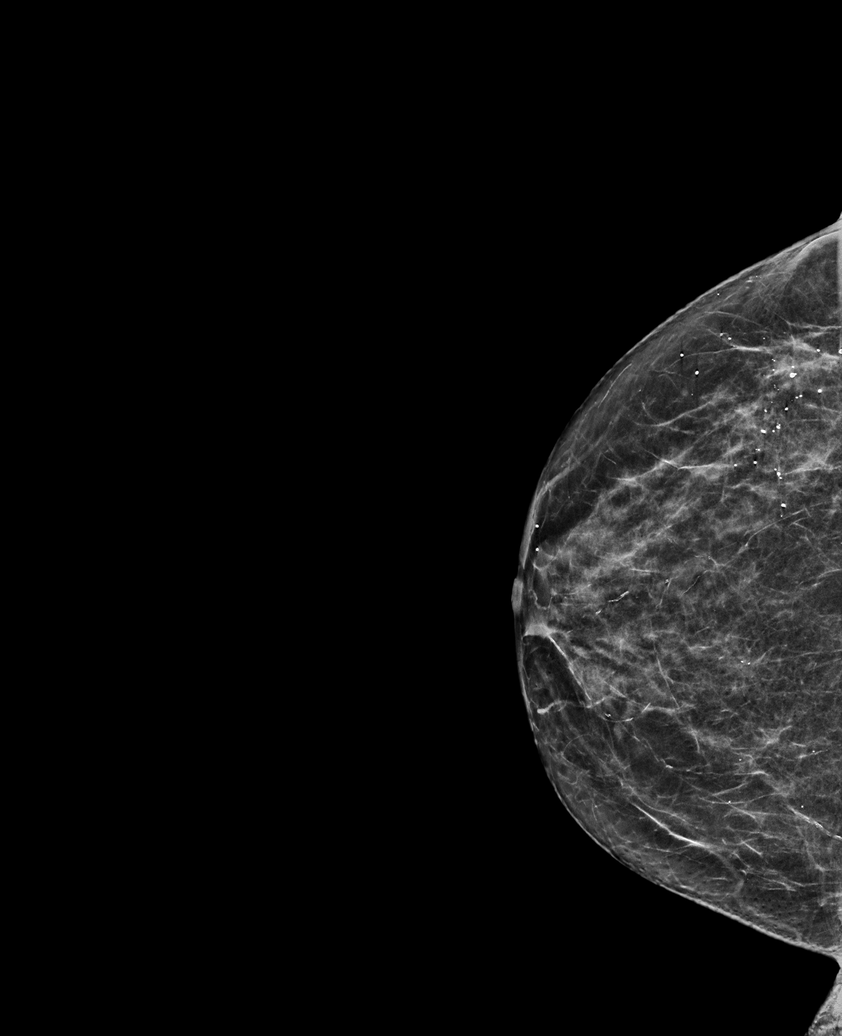

[L CC synth-2D]
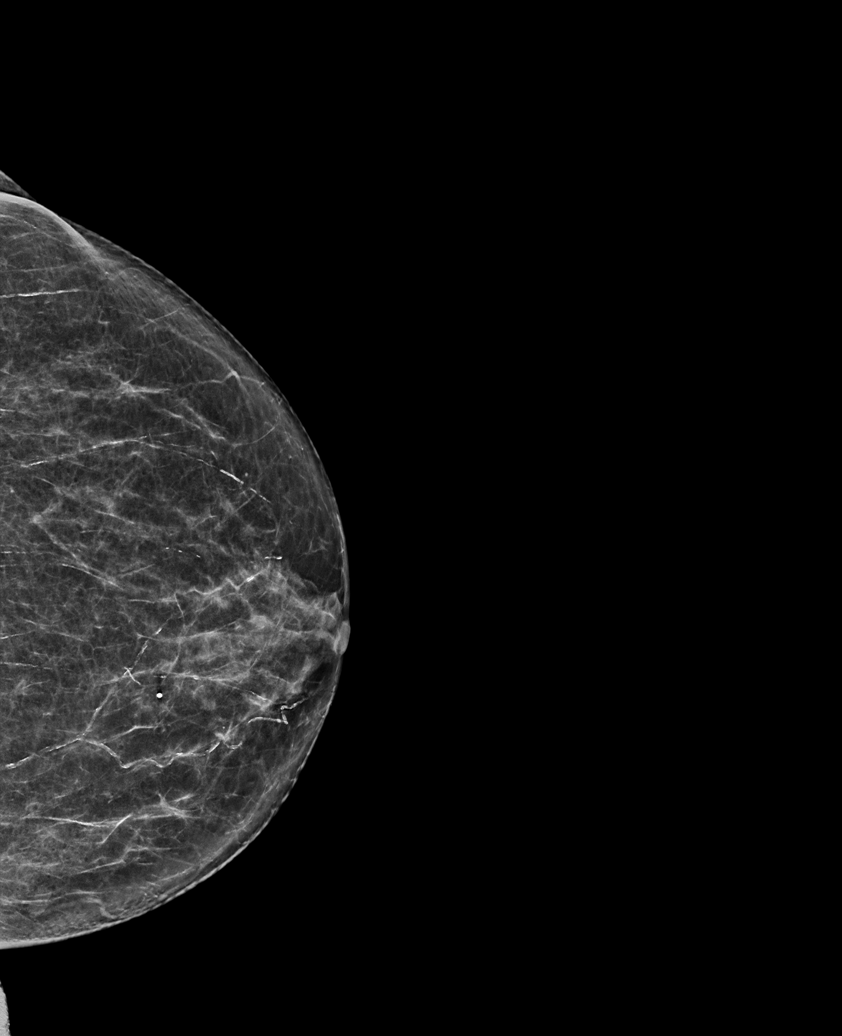

[R MLO synth-2D (2 of 2)]
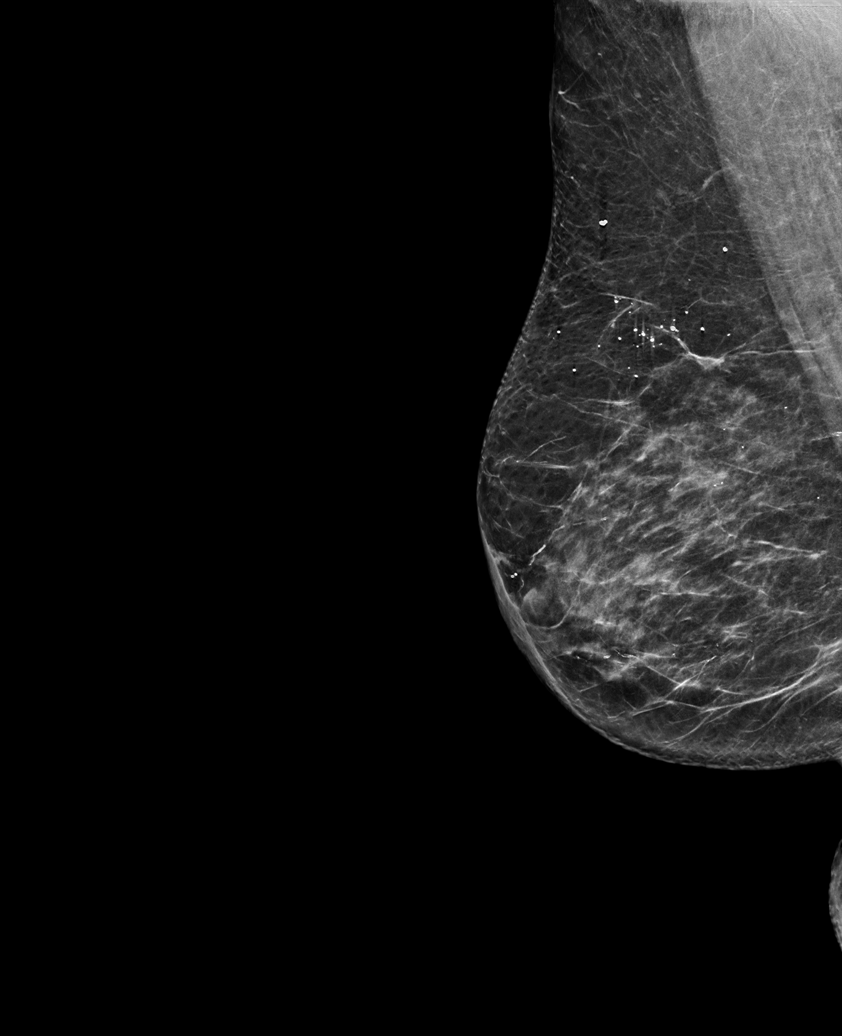

[L MLO synth-2D (2 of 2)]
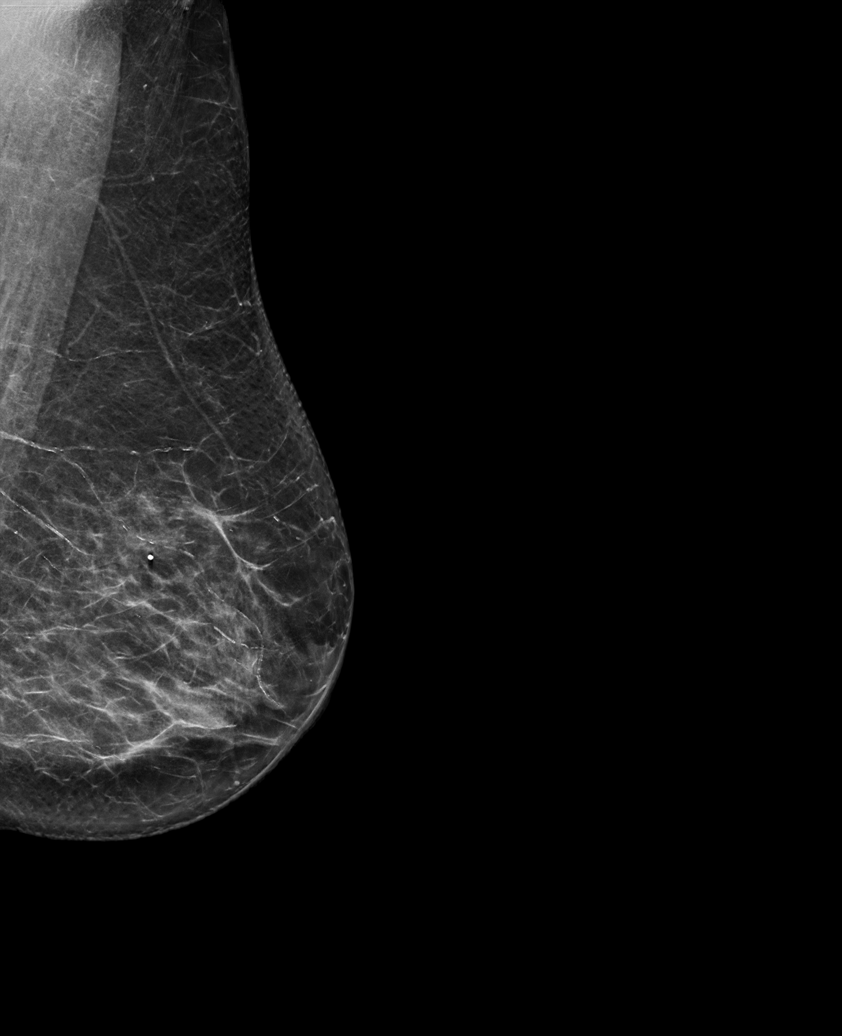

[6 of 36 positions shown; findings below may reference images not displayed]

ACR Breast Density Category c: The breast tissue is heterogeneously
dense, which may obscure small masses.
FINDINGS: There are no findings suspicious for malignancy.
IMPRESSION: No mammographic evidence of malignancy. A result letter of this
screening mammogram will be mailed directly to the patient.

RECOMMENDATION:
Screening mammogram in one year. (Code:[id number])

BI-RADS CATEGORY  1: Negative.

## 2022-08-13 DIAGNOSIS — D0439 Carcinoma in situ of skin of other parts of face: Secondary | ICD-10-CM | POA: Diagnosis not present

## 2022-09-02 ENCOUNTER — Ambulatory Visit: Payer: PPO | Admitting: Cardiology

## 2022-09-03 DIAGNOSIS — K219 Gastro-esophageal reflux disease without esophagitis: Secondary | ICD-10-CM | POA: Diagnosis not present

## 2022-09-03 DIAGNOSIS — G8929 Other chronic pain: Secondary | ICD-10-CM | POA: Diagnosis not present

## 2022-09-03 DIAGNOSIS — E785 Hyperlipidemia, unspecified: Secondary | ICD-10-CM | POA: Diagnosis not present

## 2022-09-03 DIAGNOSIS — I2510 Atherosclerotic heart disease of native coronary artery without angina pectoris: Secondary | ICD-10-CM | POA: Diagnosis not present

## 2022-09-03 DIAGNOSIS — Z7982 Long term (current) use of aspirin: Secondary | ICD-10-CM | POA: Diagnosis not present

## 2022-09-03 DIAGNOSIS — E663 Overweight: Secondary | ICD-10-CM | POA: Diagnosis not present

## 2022-09-03 DIAGNOSIS — I739 Peripheral vascular disease, unspecified: Secondary | ICD-10-CM | POA: Diagnosis not present

## 2022-09-03 DIAGNOSIS — I10 Essential (primary) hypertension: Secondary | ICD-10-CM | POA: Diagnosis not present

## 2022-09-03 DIAGNOSIS — I4891 Unspecified atrial fibrillation: Secondary | ICD-10-CM | POA: Diagnosis not present

## 2022-09-03 DIAGNOSIS — M810 Age-related osteoporosis without current pathological fracture: Secondary | ICD-10-CM | POA: Diagnosis not present

## 2022-09-03 DIAGNOSIS — M1990 Unspecified osteoarthritis, unspecified site: Secondary | ICD-10-CM | POA: Diagnosis not present

## 2022-09-08 ENCOUNTER — Telehealth: Payer: Self-pay | Admitting: Internal Medicine

## 2022-09-08 MED ORDER — LOSARTAN POTASSIUM 100 MG PO TABS: 100.0000 mg | ORAL_TABLET | Freq: Every day | ORAL | 1 refills | Status: DC

## 2022-09-08 NOTE — Telephone Encounter (Signed)
Spoke w/ pt and advised her of Dr. Darnelle Bos recommendation. She verbalizes understanding and is agreeable. Asked her to call back w/ any further questions or concerns.

## 2022-09-08 NOTE — Telephone Encounter (Signed)
  Pt c/o medication issue:  1. Name of Medication: losartan (COZAAR) 50 MG tablet   2. How are you currently taking this medication (dosage and times per day)?   Take 1 tablet (50 mg total) by mouth 2 (two) times daily. Dose decrease    3. Are you having a reaction (difficulty breathing--STAT)? No   4. What is your medication issue? Pt said this medication making her sleepy and she wants to get a different medication

## 2022-09-08 NOTE — Telephone Encounter (Signed)
This is not a typical side effect of losartan, particularly because she has been on this medication for quite some time and her blood pressure is not low.  Since somnolence seems to be most pronounced a few hours after taking the medications, I recommend that she try losartan 100 mg at bedtime rather than 50 mg twice daily.  If she continues to be sleepy throughout the day, we can consider decreasing losartan to 50 mg daily.  It should be noted that Ms. Julie Duncan has complained of fatigue for over a year, which was thought to be due to her atrial flutter.  She is now s/p flutter ablation and remains fatigued.  I recommend that she try the changes to losartan dosing outlined above and follow-up with Dr. Quentin Ore as scheduled next month for reevaluation.  Nelva Bush, MD [facility]

## 2022-09-08 NOTE — Telephone Encounter (Signed)
Spoke w/ pt.  She reports that since startin losartan 50 mg BID, she is constantly sleepy. She will sleep about 9 hrs every night and then another 2-3 hrs during the day and this is unusual for her.  She reports as soon as she gets up, she wants to lay back down and is yawning all day. She reports she is taking her losartan every 12 hrs at 9:30 and by 11:00, is ready to go back to bed. She reports her BP has been running 140s/70s, HR in the 90s. She would like to know if the dosage can be decreased or is she would be switched to a different medication. Advised her that I will make Dr. Saunders Revel aware of her concern and call her back w/ his recommendation.

## 2022-09-08 NOTE — Addendum Note (Signed)
Addended by: Dede Query R on: 09/08/2022 10:08 AM   Modules accepted: Orders

## 2022-09-22 NOTE — Progress Notes (Unsigned)
  Electrophysiology Office Follow up Visit Note:    Date:  09/23/2022   ID:  Julie Duncan, DOB June 13, 1941, MRN [id number]  PCP:  Venia Carbon, MD  [facility] Cardiologist:  Nelva Bush, MD  [facility] Electrophysiologist:  Vickie Epley, MD    Interval History:    Julie Duncan is a 82 y.o. female who presents for a follow up visit.   She had an atrial flutter ablation 06/05/2022.  She takes Eliquis for stroke ppx. She saw [facility] 07/03/2022. At that appointment, she was maintaining NSR.  She tells me that 2 weeks ago she stopped taking her Eliquis.  She did restart her aspirin 81 mg by mouth once daily.      Past medical, surgical, social and family history were reviewed.  ROS:   Please see the history of present illness.    All other systems reviewed and are negative.  EKGs/Labs/Other Studies Reviewed:    The following studies were reviewed today:  EKG:  The ekg ordered today demonstrates sinus rhythm.  First-degree AV delay.   Physical Exam:    VS:  BP 120/80   Pulse 97   Ht 5\' 6"  (1.676 m)   Wt 185 lb (83.9 kg)   SpO2 97%   BMI 29.86 kg/m     Wt Readings from Last 3 Encounters:  09/23/22 185 lb (83.9 kg)  07/03/22 183 lb 12.8 oz (83.4 kg)  06/25/22 180 lb (81.6 kg)     GEN:  Well nourished, well developed in no acute distress CARDIAC: RRR, no murmurs, rubs, gallops RESPIRATORY:  Clear to auscultation without rales, wheezing or rhonchi       ASSESSMENT:    1. Typical atrial flutter   2. Essential hypertension    PLAN:    In order of problems listed above:  #AFL Typical. S/p ablation in December 2023. No evidence of AF. She self discontinued her Eliquis and has restarted aspirin 81 mg by mouth once daily.  I discussed how I think this reasonable strategy but I would like her to have a heart rhythm monitoring strategy in place to make sure she does not develop atrial fibrillation.  She understands that she is at an increased risk of  stroke should she develop atrial fibrillation in the future.  We discussed wearable monitors and loop recorder.  She will think about her options and let us know if she would like to proceed with 1.   #Hypertension At goal today.  Recommend checking blood pressures 1-2 times per week at home and recording the values.  Recommend bringing these recordings to the primary care physician.  Follow-up 1 year with APP.   Signed, Lars Mage, MD, [facility], [facility] 09/23/2022 9:55 AM    Electrophysiology [facility]

## 2022-09-23 ENCOUNTER — Encounter: Payer: Self-pay | Admitting: Cardiology

## 2022-09-23 ENCOUNTER — Ambulatory Visit: Payer: PPO | Attending: Cardiology | Admitting: Cardiology

## 2022-09-23 VITALS — BP 120/80 | HR 97 | Ht 66.0 in | Wt 185.0 lb

## 2022-09-23 DIAGNOSIS — I483 Typical atrial flutter: Secondary | ICD-10-CM

## 2022-09-23 DIAGNOSIS — I10 Essential (primary) hypertension: Secondary | ICD-10-CM | POA: Diagnosis not present

## 2022-09-23 MED ORDER — ASPIRIN 81 MG PO TBEC: 81.0000 mg | DELAYED_RELEASE_TABLET | Freq: Every day | ORAL | 3 refills | Status: DC

## 2022-09-23 NOTE — Patient Instructions (Signed)
Medication Instructions:  Your physician has recommended you make the following change in your medication:  1) STOP taking Eliquis 2) START taking Aspirin 81 mg daily  *If you need a refill on your cardiac medications before your next appointment, please call your pharmacy*  Follow-Up: At [facility], you and your health needs are our priority.  As part of our continuing mission to provide you with exceptional heart care, we have created designated Provider Care Teams.  These Care Teams include your primary Cardiologist (physician) and Advanced Practice Providers (APPs -  Physician Assistants and Nurse Practitioners) who all work together to provide you with the care you need, when you need it.  Your next appointment:   1 year(s)  Provider:   You will see one of the following Advanced Practice Providers on your designated Care Team:   Tommye Standard, [place]" Blanco, [place], NP

## 2022-10-01 ENCOUNTER — Ambulatory Visit (INDEPENDENT_AMBULATORY_CARE_PROVIDER_SITE_OTHER): Payer: PPO | Admitting: Pulmonary Disease

## 2022-10-01 ENCOUNTER — Encounter: Payer: Self-pay | Admitting: Pulmonary Disease

## 2022-10-01 ENCOUNTER — Ambulatory Visit: Payer: PPO | Admitting: Pulmonary Disease

## 2022-10-01 VITALS — BP 132/70 | HR 88 | Ht 66.0 in | Wt 186.0 lb

## 2022-10-01 DIAGNOSIS — J849 Interstitial pulmonary disease, unspecified: Secondary | ICD-10-CM

## 2022-10-01 DIAGNOSIS — R918 Other nonspecific abnormal finding of lung field: Secondary | ICD-10-CM

## 2022-10-01 LAB — PULMONARY FUNCTION TEST
DL/VA % pred: 118 %
DL/VA: 4.74 ml/min/mmHg/L
DLCO cor % pred: 91 %
DLCO cor: 18.28 ml/min/mmHg
DLCO unc % pred: 91 %
DLCO unc: 18.28 ml/min/mmHg
FEF 25-75 Post: 2.82 L/sec
FEF 25-75 Pre: 2.5 L/sec
FEF2575-%Change-Post: 12 %
FEF2575-%Pred-Post: 205 %
FEF2575-%Pred-Pre: 182 %
FEV1-%Change-Post: 2 %
FEV1-%Pred-Post: 98 %
FEV1-%Pred-Pre: 96 %
FEV1-Post: 2.01 L
FEV1-Pre: 1.96 L
FEV1FVC-%Change-Post: 0 %
FEV1FVC-%Pred-Pre: 117 %
FEV6-%Change-Post: 1 %
FEV6-%Pred-Post: 90 %
FEV6-%Pred-Pre: 88 %
FEV6-Post: 2.33 L
FEV6-Pre: 2.29 L
FEV6FVC-%Pred-Post: 105 %
FEV6FVC-%Pred-Pre: 105 %
FVC-%Change-Post: 1 %
FVC-%Pred-Post: 85 %
FVC-%Pred-Pre: 83 %
FVC-Post: 2.33 L
FVC-Pre: 2.29 L
Post FEV1/FVC ratio: 86 %
Post FEV6/FVC ratio: 100 %
Pre FEV1/FVC ratio: 86 %
Pre FEV6/FVC Ratio: 100 %
RV % pred: 71 %
RV: 1.81 L
TLC % pred: 69 %
TLC: 3.73 L

## 2022-10-01 NOTE — Patient Instructions (Signed)
Full PFT Performed Today  

## 2022-10-01 NOTE — Progress Notes (Signed)
Synopsis: Referred in November 2023 for shortness of breath by Maurine Minister, MD  Subjective:   PATIENT ID: Julie Duncan GENDER: female DOB: 09-24-1940, MRN: [id number]  HPI  Chief Complaint  Patient presents with   Follow-up    F/U after PFT. States breathing has been stable since last visit.    Julie Duncan is an 82 year old woman, never smoker with history of atrial fibrillation/flutter, atrial myxoma s/p resection 2016, and hypertension who returns to pulmonary clinic for dyspnea.  HRCT Chest shows scattered areas of parenchymal scarring and few scattered pulmonary nodules, up to 4mm. No subpleural reticulation, bronchiectasis/bronchiolectasis, ground-glass, arhcitectural distortion or honeycombing. No air trapping. There is mild bronchiectasis of the lower lobe airways, L > R.  PFTs today show mild restrictive defect.   She has cough with mucous production. She has post nasal drainage. Overall doing ok.  OV 04/27/22 She reports on going issues with fatigue that she feels is related to her atrial fibrillation.She is scheduled for an ablation procedure in December. She denies any cough or wheezing. She denies much shortness of breath but has generalized fatigue. She denies night time awakenings due to cough or dyspnea.   She has runny nose. Denies post-nasal drainage. She denies issues with heart burn.   She is a retired Art gallery manager, previously worked at [employer] in [employer]. She denies history of dust or chemical exposures. She has no hobbies with dust or chemical exposures. She has a Emergency planning/management officer at home.   Chest radiograph 04/03/22 shows non-specific basilar interstiital thickening. CT abdomen/pelvis shows possible mild interstitial thickening vs atelectasis as it is in dependent areas.  Past Medical History:  Diagnosis Date   Atrial flutter    Atrial myxoma    a. 2016 s/p resection ([facility]); b. 04/2017 Echo: EF 60-65%, no rwma, mild AI/MR. Mild-mod TR. Nl PASP; c. 07/2021 Echo: EF 60-65%, no  rwma, nl RV fxn, mild MR/AI. Ao sclerosis.   Coronary artery disease 03/27/2022   50% mid LAD stenosis (not significant by iFR)   History of stress test    a. 04/2017 ETT: Poor ex capacity. No ischemia. Episode of PSVT noted.   Hyperlipidemia    Hypertension    Mobitz I    Mobitz II    Paroxysmal atrial fibrillation    a. CHA2DS2VASc = 4.  Did not tolerate Warfarin. Difficulty in affording DOAC. Eliquis added 07/2021 (new Aflutter).   Paroxysmal SVT (supraventricular tachycardia)    a. 06/2020 Zio: 3813 SVT runs - longest 6m 47s, max rate 231; b. 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 Has a cat.   Daughter in [place]. Son in [place]      Has living will   Daughter is health care POA   Would accept resuscitation   Not sure about tube feeds   Social Determinants of Health   Financial Resource Strain: Not on file  Food Insecurity: Not on file  Transportation Needs: Not on file  Physical Activity: Not on file  Stress: Not on file  Social Connections: Not on file  Intimate Partner Violence: Not on file     Allergies  Allergen Reactions   Cortisone Other (See Comments)    Patient states  cortisone injections have "messes with my heart".  Has had cortisone shots and each time causes "heart rhythm problems".   Amlodipine Itching   Red Yeast Rice  [Cholestin] Other (See Comments)   Saccharin Other (See Comments)   Warfarin And Related     anorexia     Outpatient Medications Prior to Visit  Medication Sig Dispense Refill   ascorbic acid (VITAMIN C) 500 MG tablet Take 500 mg by mouth daily.     aspirin EC 81 MG tablet Take 1 tablet (81 mg total) by mouth daily. Swallow whole. 90 tablet 3   Cholecalciferol (VITAMIN D PO) Take 1 tablet by mouth daily.     Coenzyme Q10 10 MG capsule Take 10 mg by mouth daily.     cyanocobalamin 50 MCG tablet Take 1 tablet by mouth daily.     Ginkgo Biloba 40 MG TABS Take 1 tablet by mouth daily.     lactose free nutrition (BOOST) LIQD Take 237 mLs by mouth daily with supper.     losartan (COZAAR) 100 MG tablet Take 1 tablet (100 mg total) by mouth daily. Dose decrease 90 tablet 1   OVER THE COUNTER MEDICATION Take 2 tablets by mouth daily. Total beets     rosuvastatin (CRESTOR) 10 MG tablet Take 1 tablet (10 mg total) by mouth daily. 90 tablet 3   VITAMIN E PO Take 1 tablet by mouth daily.     No facility-administered medications prior to visit.    Review of Systems  Constitutional:  Negative for chills, fever, malaise/fatigue and weight loss.  HENT:  Negative for congestion, sinus pain and sore throat.   Eyes: Negative.   Respiratory:  Positive for cough, sputum production and shortness of breath. Negative for hemoptysis and wheezing.   Cardiovascular:  Negative for chest pain, palpitations, orthopnea, claudication and leg swelling.  Gastrointestinal:  Negative for abdominal pain, heartburn, nausea and vomiting.  Genitourinary: Negative.   Musculoskeletal:  Negative for joint pain and myalgias.  Skin:  Negative for rash.  Neurological:  Negative for weakness.  Endo/Heme/Allergies: Negative.   Psychiatric/Behavioral: Negative.      Objective:   Vitals:   10/01/22 1346  BP: 132/70  Pulse: 88  SpO2: 98%  Weight: 186 lb (84.4 kg)  Height: 5\' 6"  (1.676 m)    Physical Exam Constitutional:      General: She is not in acute distress.    Appearance: She is not ill-appearing.  HENT:     Head: Normocephalic and atraumatic.  Eyes:     General: No scleral icterus.    Conjunctiva/sclera: Conjunctivae normal.  Cardiovascular:     Rate and Rhythm: Normal rate and regular rhythm.  Pulses: Normal pulses.     Heart sounds: Normal heart sounds. No murmur heard. Pulmonary:     Effort: Pulmonary effort is normal.     Breath sounds: Normal breath sounds. No wheezing, rhonchi or rales.  Musculoskeletal:     Right lower leg: No edema.     Left lower leg: No edema.  Skin:    General: Skin is warm and dry.  Neurological:     General: No focal deficit present.     Mental Status: She is alert.    CBC    Component Value Date/Time   WBC 6.1 06/25/2022 1201   RBC 4.20 06/25/2022 1201   HGB 12.8 06/25/2022 1201   HGB 13.2 07/29/2021 0858   HCT 39.3 06/25/2022 1201   HCT 35.8 07/29/2021 0858   PLT 331 06/25/2022 1201   PLT 317 07/29/2021 0858   MCV 93.6 06/25/2022 1201   MCV 93 07/29/2021 0858   MCH 30.5 06/25/2022 1201   MCHC 32.6 06/25/2022 1201   RDW 12.6 06/25/2022 1201   RDW 12.4 07/29/2021 0858   LYMPHSABS 1.8 05/11/2022 1022   LYMPHSABS 2.1 07/24/2020 1053   MONOABS 0.5 05/11/2022 1022   EOSABS 0.2 05/11/2022 1022   EOSABS 0.2 07/24/2020 1053   BASOSABS 0.0 05/11/2022 1022   BASOSABS 0.0 07/24/2020 1053      Latest Ref Rng & Units 06/25/2022   12:01 PM 05/11/2022   10:22 AM 04/15/2022   10:51 AM  BMP  Glucose 70 - 99 mg/dL 94  91  95   BUN 8 - 23 mg/dL 27  20  21    Creatinine 0.44 - 1.00 mg/dL 8.27  0.78  6.75   Sodium 135 - 145 mmol/L 137  143  138   Potassium 3.5 - 5.1 mmol/L 4.0  3.8  3.8   Chloride 98 - 111 mmol/L 99  105  101   CO2 22 - 32 mmol/L 30  29  29    Calcium 8.9 - 10.3 mg/dL  9.2  9.5  9.1    Chest imaging: HRCT Chest 05/01/22 1. No evidence of fibrotic interstitial lung disease. Air trapping is indicative of small airways disease. 2. Cholelithiasis. 3. Aortic atherosclerosis (ICD10-I70.0). Coronary artery calcification.  CXR 04/03/22 Cardiac silhouette and mediastinal contours within normal limits. Mild-to-moderate calcification within the aortic arch. Minimal chronic interstitial thickening is unchanged. No new focal airspace opacity. No pleural effusion or pneumothorax. Right anterior mediastinal surgical clips are again seen. Additional anterior right lateral lungs surgical clips. Mild-to-moderate multilevel degenerative disc changes of the thoracic spine.  PFT:    Latest Ref Rng & Units 10/01/2022   10:39 AM  PFT Results  FVC-Pre L 2.29  P  FVC-Predicted Pre % 83  P  FVC-Post L 2.33  P  FVC-Predicted Post % 85  P  Pre FEV1/FVC % % 86  P  Post FEV1/FCV % % 86  P  FEV1-Pre L 1.96  P  FEV1-Predicted Pre % 96  P  FEV1-Post L 2.01  P  DLCO uncorrected ml/min/mmHg 18.28  P  DLCO UNC% % 91  P  DLCO corrected ml/min/mmHg 18.28  P  DLCO COR %Predicted % 91  P  DLVA Predicted % 118  P  TLC L 3.73  P  TLC % Predicted % 69  P  RV % Predicted % 71  P    P Preliminary result    Labs:  Path:  Echo:  Heart Catheterization:  Assessment & Plan:   Abnormal  CT scan of lung - Plan: CT CHEST HIGH RESOLUTION  ILD (interstitial lung disease)  Discussion: Julie Duncan is an [age] year old woman, never smoker with history of atrial fibrillation/flutter, atrial myxoma s/p resection 2016, and hypertension who returns to pulmonary clinic for dyspnea.  Her HRCT Chest shows scattered areas of parenchymal scarring but they do not fit a specific pattern of ILD. There is mild bronchiectasis of the lower lobes bilaterally, but greater on left.   Her PFTs show mild restrictive disease.   We discussed the above results and offered inhaler therapy for the  bronchiectasis but she preferred to hold off on any medications at this time.   She prefers to monitor these findings along with her symptoms at this time.  Follow up in 1 year with CT Chest.   Melody ComasJonathan Johncharles Fusselman, MD [facility] Office: [phone]   Current Outpatient Medications:    ascorbic acid (VITAMIN C) 500 MG tablet, Take 500 mg by mouth daily., Disp: , Rfl:    aspirin EC 81 MG tablet, Take 1 tablet (81 mg total) by mouth daily. Swallow whole., Disp: 90 tablet, Rfl: 3   Cholecalciferol (VITAMIN D PO), Take 1 tablet by mouth daily., Disp: , Rfl:    Coenzyme Q10 10 MG capsule, Take 10 mg by mouth daily., Disp: , Rfl:    cyanocobalamin 50 MCG tablet, Take 1 tablet by mouth daily., Disp: , Rfl:    Ginkgo Biloba 40 MG TABS, Take 1 tablet by mouth daily., Disp: , Rfl:    lactose free nutrition (BOOST) LIQD, Take 237 mLs by mouth daily with supper., Disp: , Rfl:    losartan (COZAAR) 100 MG tablet, Take 1 tablet (100 mg total) by mouth daily. Dose decrease, Disp: 90 tablet, Rfl: 1   OVER THE COUNTER MEDICATION, Take 2 tablets by mouth daily. Total beets, Disp: , Rfl:    rosuvastatin (CRESTOR) 10 MG tablet, Take 1 tablet (10 mg total) by mouth daily., Disp: 90 tablet, Rfl: 3   VITAMIN E PO, Take 1 tablet by mouth daily., Disp: , Rfl:

## 2022-10-01 NOTE — Progress Notes (Signed)
Full PFT Performed Today  

## 2022-10-01 NOTE — Patient Instructions (Addendum)
Your CT Chest scan shows mild bronchiectasis and scattered areas of mild scarring   The breathing tests show mild restrictive defect.  We will continue to monitor these findings with a repeat CT Chest scan in 1 year  Follow up in 1 year, call sooner if needed

## 2022-10-22 ENCOUNTER — Ambulatory Visit: Payer: PPO | Admitting: Internal Medicine

## 2022-10-26 ENCOUNTER — Encounter: Payer: Self-pay | Admitting: Internal Medicine

## 2022-10-26 ENCOUNTER — Ambulatory Visit (INDEPENDENT_AMBULATORY_CARE_PROVIDER_SITE_OTHER): Payer: PPO | Admitting: Internal Medicine

## 2022-10-26 VITALS — BP 132/70 | HR 101 | Temp 97.4°F | Ht 63.75 in | Wt 185.0 lb

## 2022-10-26 DIAGNOSIS — Z0000 Encounter for general adult medical examination without abnormal findings: Secondary | ICD-10-CM | POA: Insufficient documentation

## 2022-10-26 DIAGNOSIS — M19072 Primary osteoarthritis, left ankle and foot: Secondary | ICD-10-CM | POA: Diagnosis not present

## 2022-10-26 DIAGNOSIS — I471 Supraventricular tachycardia, unspecified: Secondary | ICD-10-CM | POA: Diagnosis not present

## 2022-10-26 DIAGNOSIS — I480 Paroxysmal atrial fibrillation: Secondary | ICD-10-CM | POA: Diagnosis not present

## 2022-10-26 DIAGNOSIS — N1831 Chronic kidney disease, stage 3a: Secondary | ICD-10-CM

## 2022-10-26 DIAGNOSIS — I10 Essential (primary) hypertension: Secondary | ICD-10-CM | POA: Diagnosis not present

## 2022-10-26 DIAGNOSIS — J849 Interstitial pulmonary disease, unspecified: Secondary | ICD-10-CM | POA: Diagnosis not present

## 2022-10-26 DIAGNOSIS — M19071 Primary osteoarthritis, right ankle and foot: Secondary | ICD-10-CM | POA: Diagnosis not present

## 2022-10-26 NOTE — Patient Instructions (Signed)
Please try over the counter diclofenac gel for your ankles

## 2022-10-26 NOTE — Assessment & Plan Note (Signed)
No recurrent symptoms.

## 2022-10-26 NOTE — Assessment & Plan Note (Signed)
Mild symptoms No Rx for now 

## 2022-10-26 NOTE — Assessment & Plan Note (Signed)
Asked her to try diclofenac gel 

## 2022-10-26 NOTE — Assessment & Plan Note (Signed)
I have personally reviewed the Medicare Annual Wellness questionnaire and have noted 1. The patient's medical and social history 2. Their use of alcohol, tobacco or illicit drugs 3. Their current medications and supplements 4. The patient's functional ability including ADL's, fall risks, home safety risks and hearing or visual             impairment. 5. Diet and physical activities 6. Evidence for depression or mood disorders  The patients weight, height, BMI and visual acuity have been recorded in the chart I have made referrals, counseling and provided education to the patient based review of the above and I have provided the pt with a written personalized care plan for preventive services.  I have provided you with a copy of your personalized plan for preventive services. Please take the time to review along with your updated medication list.  Done with cancer screening due to age Tries to exercise Thinks she had RSV vaccine Flu vaccine in the fall Prefers no more COVID vaccines

## 2022-10-26 NOTE — Progress Notes (Signed)
Subjective:    Patient ID: Julie Duncan, female    DOB: 02/09/1941, 82 y.o.   MRN: [id number]  HPI Here for Medicare wellness visit and follow up of chronic health conditions Reviewed advanced directives Reviewed other doctors----[facility] ([facility] Dr Sarajane Jews cardiology, Dr End--cardiology, Dr Vicente Masson, [facility] No surgery--did have ablation--no hospitalizations Does do weekly exercise class--and does chair exercises (hard to do other things with ankle problems) Vision is not bad----2 different doctors (has cataracts and reluctant to get them taken out) Hearing is fine No alcohol or tobacco No falls No depression or anhedonia--stays active at church and senior center Independent with instrumental ADLs No sig memory problems  Had atrial fibrillation ablation No palpitations now No chest pain or SOB No dizziness or syncope No edema  Sees pulmonary for ILD No treatment for now Coughs if she sings or talks too much  Last GFR 56  Current Outpatient Medications on File Prior to Visit  Medication Sig Dispense Refill   ascorbic acid (VITAMIN C) 500 MG tablet Take 500 mg by mouth daily.     aspirin EC 81 MG tablet Take 1 tablet (81 mg total) by mouth daily. Swallow whole. 90 tablet 3   Cholecalciferol (VITAMIN D PO) Take 1 tablet by mouth daily.     Coenzyme Q10 10 MG capsule Take 10 mg by mouth daily.     cyanocobalamin 50 MCG tablet Take 1 tablet by mouth daily.     Ginkgo Biloba 40 MG TABS Take 1 tablet by mouth daily.     lactose free nutrition (BOOST) LIQD Take 237 mLs by mouth daily with supper.     losartan (COZAAR) 100 MG tablet Take 1 tablet (100 mg total) by mouth daily. Dose decrease 90 tablet 1   OVER THE COUNTER MEDICATION Take 2 tablets by mouth daily. Total beets     rosuvastatin (CRESTOR) 10 MG tablet Take 1 tablet (10 mg total) by mouth daily. 90 tablet 3   VITAMIN E PO Take 1 tablet by mouth daily.     No current  facility-administered medications on file prior to visit.    Allergies  Allergen Reactions   Cortisone Other (See Comments)    Patient states cortisone injections have "messes with my heart".  Has had cortisone shots and each time causes "heart rhythm problems".   Amlodipine Itching   Red Yeast Rice  [Cholestin] Other (See Comments)   Saccharin Other (See Comments)   Warfarin And Related     anorexia    Past Medical History:  Diagnosis Date   Atrial flutter ([facility])    Atrial myxoma    a. 2016 s/p resection ([facility]); b. 04/2017 Echo: EF 60-65%, no rwma, mild AI/MR. Mild-mod TR. Nl PASP; c. 07/2021 Echo: EF 60-65%, no rwma, nl RV fxn, mild MR/AI. Ao sclerosis.   Coronary artery disease 03/27/2022   50% mid LAD stenosis (not significant by iFR)   History of stress test    a. 04/2017 ETT: Poor ex capacity. No ischemia. Episode of PSVT noted.   Hyperlipidemia    Hypertension    Mobitz I    Mobitz II    Paroxysmal atrial fibrillation ([facility])    a. CHA2DS2VASc = 4.  Did not tolerate Warfarin. Difficulty in affording DOAC. Eliquis added 07/2021 (new Aflutter).   Paroxysmal SVT (supraventricular tachycardia)    a. 06/2020 Zio: 3813 SVT runs - longest 32m 47s, max rate 231; b. 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  Past Surgical History:  Procedure Laterality Date   ANKLE SURGERY Left    ATRIAL FIBRILLATION ABLATION N/A 06/05/2022   Procedure: ATRIAL FIBRILLATION ABLATION;  Surgeon: Lanier Prude, MD;  Location: [facility];  Service: Cardiovascular;  Laterality: N/A;   CARDIOVERSION N/A 08/26/2021   Procedure: CARDIOVERSION;  Surgeon: Yvonne Kendall, MD;  Location: [facility];  Service: Cardiovascular;  Laterality: N/A;   CORONARY PRESSURE/FFR STUDY N/A 03/27/2022   Procedure: INTRAVASCULAR PRESSURE WIRE/FFR STUDY;  Surgeon: Yvonne Kendall, MD;  Location: [facility];  Service: Cardiovascular;  Laterality: N/A;   EXCISION OF ATRIAL MYXOMA     LEFT HEART CATH AND CORONARY  ANGIOGRAPHY N/A 03/27/2022   Procedure: LEFT HEART CATH AND CORONARY ANGIOGRAPHY;  Surgeon: Yvonne Kendall, MD;  Location: [facility];  Service: Cardiovascular;  Laterality: N/A;   PARTIAL HYSTERECTOMY     SVT ABLATION N/A 07/30/2020   Procedure: SVT ABLATION;  Surgeon: Marinus Maw, MD;  Location: [facility];  Service: Cardiovascular;  Laterality: N/A;   WRIST SURGERY Right     Family History  Problem Relation Age of Onset   Stroke Mother 28   Heart attack Father 42   Cancer Father        throat   Cancer Sister 67       lung   Cancer Brother        lung   Heart attack Brother 67    Social History   Socioeconomic History   Marital status: Widowed    Spouse name: Not on file   Number of children: 2   Years of education: Not on file   Highest education level: Not on file  Occupational History   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 Has a cat.   Daughter in [place]. Son in [place]      Has living will   Daughter is health care POA   Would accept resuscitation   Not sure about tube feeds   Social Determinants of Health   Financial Resource Strain: Not on file  Food Insecurity: Not on file  Transportation Needs: Not on file  Physical Activity: Not on file  Stress: Not on file  Social Connections: Not on file  Intimate Partner Violence: Not on file   Review of Systems Appetite is not that big--but weight is stable Sleeps well Wears seat belt Teeth okay---trouble with lower partial. Needs a new dentist Had something taken off left cheek 2 months ago--no other suspicious lesions No heartburn or dysphagia Bowels move fine--no  blood Scattered joint pains other than the ankles---takes osteo biflex     Objective:   Physical Exam Constitutional:      Appearance: Normal appearance.  HENT:     Mouth/Throat:     Pharynx: No oropharyngeal exudate or posterior oropharyngeal erythema.  Eyes:     Conjunctiva/sclera: Conjunctivae normal.     Pupils: Pupils are equal, round, and reactive to light.  Cardiovascular:     Rate and Rhythm: Normal rate and regular rhythm.     Pulses: Normal pulses.  Heart sounds: No murmur heard.    No gallop.  Pulmonary:     Effort: Pulmonary effort is normal.     Breath sounds: Normal breath sounds. No wheezing or rales.  Abdominal:     Palpations: Abdomen is soft.     Tenderness: There is no abdominal tenderness.  Musculoskeletal:     Cervical back: Neck supple.     Right lower leg: No edema.     Left lower leg: No edema.     Comments: Bony thickening in ankles  Lymphadenopathy:     Cervical: No cervical adenopathy.  Skin:    Findings: No rash.  Neurological:     General: No focal deficit present.     Mental Status: She is alert and oriented to person, place, and time.     Comments: Word naming--- 9/1 minute Recall 3/3  Psychiatric:        Mood and Affect: Mood normal.        Behavior: Behavior normal.            Assessment & Plan:

## 2022-10-26 NOTE — Assessment & Plan Note (Signed)
BP Readings from Last 3 Encounters:  10/26/22 132/70  10/01/22 132/70  09/23/22 120/80   Okay on losartan 100mg  daily Labs this year okay

## 2022-10-26 NOTE — Progress Notes (Signed)
Hearing Screening - Comments:: Passed whisper test Vision Screening - Comments:: April 2024  

## 2022-10-26 NOTE — Assessment & Plan Note (Signed)
Regular now since ablation On ASA 81mg  only now

## 2022-10-26 NOTE — Assessment & Plan Note (Signed)
Borderline low On the losartan-no other action

## 2022-10-29 ENCOUNTER — Ambulatory Visit: Payer: PPO | Attending: Internal Medicine | Admitting: Internal Medicine

## 2022-10-29 ENCOUNTER — Encounter: Payer: Self-pay | Admitting: Internal Medicine

## 2022-10-29 VITALS — BP 124/62 | HR 96 | Ht 63.0 in | Wt 185.0 lb

## 2022-10-29 DIAGNOSIS — E782 Mixed hyperlipidemia: Secondary | ICD-10-CM

## 2022-10-29 DIAGNOSIS — I10 Essential (primary) hypertension: Secondary | ICD-10-CM

## 2022-10-29 DIAGNOSIS — I2510 Atherosclerotic heart disease of native coronary artery without angina pectoris: Secondary | ICD-10-CM | POA: Diagnosis not present

## 2022-10-29 DIAGNOSIS — I4892 Unspecified atrial flutter: Secondary | ICD-10-CM

## 2022-10-29 DIAGNOSIS — I4891 Unspecified atrial fibrillation: Secondary | ICD-10-CM | POA: Insufficient documentation

## 2022-10-29 NOTE — Progress Notes (Signed)
Follow-up Outpatient Visit Date: 10/29/2022  Primary Care Provider: Karie Schwalbe, MD [address] [place] [address]  Chief Complaint: Follow-up atrial fibrillation/flutter  HPI:  Ms. Ruffing is a 82 y.o. female with history of atrial fibrillation/flutter, SVT status post ablation, Mobitz type I second-degree AV block, atrial myxoma status post resection (09/2014 at [facility]) complicated by postoperative atrial fibrillation, nonobstructive coronary artery disease by recent catheterization (03/2022), hypertension, and osteoarthritis, who presents for follow-up of atrial flutter and heart block.  I last saw her in January, at which time she reported continued shortness of breath with mild activity.  Overall, she was not feeling any better following her flutter ablation.  She was noted to be in sinus tachycardia with a blocked PAC on EKG.  Blood pressure was unusually soft.  Urgent echocardiogram showed LVEF of 50-55% with no evidence of pericardial effusion.  Mild mitral and aortic regurgitation were noted.  She saw Dr. Lalla Brothers last month at which time she noted having stopped apixaban on her own.  Blood pressure was normal at that visit.  She was left on aspirin in place of apixaban.  Dr. Lalla Brothers recommended cardiac monitoring with external monitor versus loop recorder to ensure that she was not having atrial fibrillation.  She wished to think about her options at home.  Today, Ms. Jauregui reports that she is feeling quite well, the best that she has been in months.  She denies chest pain, shortness of breath, palpitations, lightheadedness, and edema.  She remains on low-dose aspirin.  She is not interested in wearing an event monitor or undergoing ILR placement.  [facility] to do [facility].  She also tries to do light exercises at home.  --------------------------------------------------------------------------------------------------  Past Medical  History:  Diagnosis Date   Atrial flutter (HCC)    Atrial myxoma    a. 2016 s/p resection ([facility]); b. 04/2017 Echo: EF 60-65%, no rwma, mild AI/MR. Mild-mod TR. Nl PASP; c. 07/2021 Echo: EF 60-65%, no rwma, nl RV fxn, mild MR/AI. Ao sclerosis.   Coronary artery disease 03/27/2022   50% mid LAD stenosis (not significant by iFR)   History of stress test    a. 04/2017 ETT: Poor ex capacity. No ischemia. Episode of PSVT noted.   Hyperlipidemia    Hypertension    Mobitz I    Mobitz II    Paroxysmal atrial fibrillation (HCC)    a. CHA2DS2VASc = 4.  Did not tolerate Warfarin. Difficulty in affording DOAC. Eliquis added 07/2021 (new Aflutter).   Paroxysmal SVT (supraventricular tachycardia)    a. 06/2020 Zio: 3813 SVT runs - longest 36m 47s, max rate 231; b. 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   Past Surgical History:  Procedure Laterality Date   ANKLE SURGERY Left    ATRIAL FIBRILLATION ABLATION N/A 06/05/2022   Procedure: ATRIAL FIBRILLATION ABLATION;  Surgeon: Lanier Prude, MD;  Location: [facility];  Service: Cardiovascular;  Laterality: N/A;   CARDIOVERSION N/A 08/26/2021   Procedure: CARDIOVERSION;  Surgeon: Yvonne Kendall, MD;  Location: [facility] ORS;  Service: Cardiovascular;  Laterality: N/A;   CORONARY PRESSURE/FFR STUDY N/A 03/27/2022   Procedure: INTRAVASCULAR PRESSURE WIRE/FFR STUDY;  Surgeon: Yvonne Kendall, MD;  Location: [facility] INVASIVE CV [facility];  Service: Cardiovascular;  Laterality: N/A;   EXCISION OF ATRIAL MYXOMA     LEFT HEART CATH AND CORONARY ANGIOGRAPHY N/A 03/27/2022   Procedure: LEFT HEART CATH AND CORONARY ANGIOGRAPHY;  Surgeon: Etha Stambaugh,  Cristal Deer, MD;  Location: [facility] INV[facility]IVE CV LAB;  Service: Cardiovascular;  Laterality: N/A;   PARTIAL HYSTERECTOMY     SVT ABLATION N/A 07/30/2020   Procedure: SVT ABLATION;  Surgeon: Marinus Maw, MD;  Location: [facility];  Service: Cardiovascular;  Laterality: N/A;   WRIST SURGERY Right     Current Meds   Medication Sig   ascorbic acid (VITAMIN C) 500 MG tablet Take 500 mg by mouth daily.   aspirin EC 81 MG tablet Take 1 tablet (81 mg total) by mouth daily. Swallow whole.   Cholecalciferol (VITAMIN D PO) Take 1 tablet by mouth daily.   Coenzyme Q10 10 MG capsule Take 10 mg by mouth daily.   cyanocobalamin 50 MCG tablet Take 1 tablet by mouth daily.   Ginkgo Biloba 40 MG TABS Take 1 tablet by mouth daily.   lactose free nutrition (BOOST) LIQD Take 237 mLs by mouth daily with supper.   losartan (COZAAR) 100 MG tablet Take 1 tablet (100 mg total) by mouth daily. Dose decrease   OVER THE COUNTER MEDICATION Take 2 tablets by mouth daily. Total beets   rosuvastatin (CRESTOR) 10 MG tablet Take 1 tablet (10 mg total) by mouth daily.   VITAMIN E PO Take 1 tablet by mouth daily.    Allergies: Cortisone, Amlodipine, Red yeast rice  [cholestin], Saccharin, and Warfarin and related  Social History   Tobacco Use   Smoking status: Never    Passive exposure: Past   Smokeless tobacco: Never  Vaping Use   Vaping Use: Never used  Substance Use Topics   Alcohol use: No   Drug use: No    Family History  Problem Relation Age of Onset   Stroke Mother 48   Heart attack Father 73   Cancer Father        throat   Cancer Sister 73       lung   Cancer Brother        lung   Heart attack Brother 50    Review of Systems: A 12-system review of systems was performed and was negative except as noted in the HPI.  --------------------------------------------------------------------------------------------------  Physical Exam: BP 124/62 (BP Location: Left Arm, Patient Position: Sitting, Cuff Size: Normal)   Pulse 96   Ht 5\' 3"  (1.6 m)   Wt 185 lb (83.9 kg)   SpO2 98%   BMI 32.77 kg/m   General:  NAD. Neck: No JVD or HJR. Lungs: Clear to auscultation bilaterally without wheezes or crackles. Heart: Regular rate and rhythm without murmurs, rubs, or gallops. Abdomen: Soft, nontender,  nondistended. Extremities: No lower extremity edema.  EKG: Normal sinus rhythm with first-degree AV block.  No significant change from prior tracing on 09/23/2022.  Lab Results  Component Value Date   WBC 6.1 06/25/2022   HGB 12.8 06/25/2022   HCT 39.3 06/25/2022   MCV 93.6 06/25/2022   PLT 331 06/25/2022    Lab Results  Component Value Date   NA 137 06/25/2022   K 4.0 06/25/2022   CL 99 06/25/2022   CO2 30 06/25/2022   BUN 27 (H) 06/25/2022   CREATININE 1.00 06/25/2022   GLUCOSE 94 06/25/2022   ALT 13 04/15/2022    Lab Results  Component Value Date   CHOL 257 (H) 06/17/2010   HDL 46.60 06/17/2010   LDLCALC 179 01/21/2009   LDLCALC 179 01/21/2009   LDLCALC 179 01/21/2009   LDLDIRECT 183.0 06/17/2010   TRIG 185.0 (H) 06/17/2010  CHOLHDL 6 06/17/2010    --------------------------------------------------------------------------------------------------  ASSESSMENT AND PLAN: Atrial fibrillation/flutter: No evidence of recurrence following flutter ablation with Dr. Lalla Brothers in December.  She remains at risk for paroxysmal atrial fibrillation but does not wish to pursue ambulatory cardiac monitoring or implantable loop recorder placement at this time.  She will be continued on low-dose aspirin as previously discussed with Dr. Lalla Brothers.  Defer AV nodal blocking agents given history of first-degree AV block and Mobitz type I second-degree AV block.  Nonobstructive coronary artery disease: No angina reported.  Catheterization in 03/2022 notable for 50% mid LAD stenosis that was not hemodynamically significant.  Continue aspirin and statin therapy.  Hypertension: Blood pressure well-controlled today.  Continue losartan.  Hyperlipidemia: Last lipid check in 2022 notable for mildly elevated LDL of 135, improved from 329 on prior check.  We discussed rechecking a lipid panel today, though Ms. Buonocore wishes to defer this until her next blood draw.  Continue rosuvastatin 10 mg daily for  now; we would like to target an LDL less than 70 given moderate LAD disease on prior catheterization.  Follow-up: Return to clinic in 1 year.  Yvonne Kendall, MD 10/29/2022 11:41 AM

## 2022-10-29 NOTE — Patient Instructions (Signed)
Medication Instructions:  Your Physician recommend you continue on your current medication as directed.    *If you need a refill on your cardiac medications before your next appointment, please call your pharmacy*   Lab Work: None ordered today   Testing/Procedures: None ordered today   Follow-Up: At [facility], you and your health needs are our priority.  As part of our continuing mission to provide you with exceptional heart care, we have created designated Provider Care Teams.  These Care Teams include your primary Cardiologist (physician) and Advanced Practice Providers (APPs -  Physician Assistants and Nurse Practitioners) who all work together to provide you with the care you need, when you need it.  We recommend signing up for the patient portal called "MyChart".  Sign up information is provided on this After Visit Summary.  MyChart is used to connect with patients for Virtual Visits (Telemedicine).  Patients are able to view lab/test results, encounter notes, upcoming appointments, etc.  Non-urgent messages can be sent to your provider as well.   To learn more about what you can do with MyChart, go to [email or web address].com.    Your next appointment:   1 year(s)  Provider:   You may see Christopher End, MD or one of the following Advanced Practice Providers on your designated Care Team:   Christopher Berge, NP Ryan Dunn, PA-C Cadence Furth, PA-C Sheri Hammock, NP    

## 2022-11-04 DIAGNOSIS — H353122 Nonexudative age-related macular degeneration, left eye, intermediate dry stage: Secondary | ICD-10-CM | POA: Diagnosis not present

## 2022-11-04 DIAGNOSIS — H353112 Nonexudative age-related macular degeneration, right eye, intermediate dry stage: Secondary | ICD-10-CM | POA: Diagnosis not present

## 2022-11-04 DIAGNOSIS — H2513 Age-related nuclear cataract, bilateral: Secondary | ICD-10-CM | POA: Diagnosis not present

## 2022-11-04 DIAGNOSIS — H43392 Other vitreous opacities, left eye: Secondary | ICD-10-CM | POA: Diagnosis not present

## 2022-12-17 ENCOUNTER — Telehealth: Payer: Self-pay | Admitting: Internal Medicine

## 2022-12-17 NOTE — Telephone Encounter (Signed)
Left a message for the patient to call back.  

## 2022-12-17 NOTE — Telephone Encounter (Signed)
It is fine to change losartan dosing to 50 mg twice daily.  Yvonne Kendall, MD [facility]

## 2022-12-17 NOTE — Telephone Encounter (Signed)
Returned the call to the patient. She stated that her blood pressure in the mornings have been running in the 140's and 150's systolic. This morning it was 150/82. She currently takes Losartan 100 mg at night.   She would like to try the 50 mg bid again and would prefer a new script be sent in. She has been advised to check and log her blood pressure 1-2 hours after taking her medications.

## 2022-12-17 NOTE — Telephone Encounter (Signed)
Pt c/o medication issue:  1. Name of Medication:   losartan (COZAAR) 100 MG tablet    2. How are you currently taking this medication (dosage and times per day)?  Take 1 tablet (100 mg total) by mouth daily. Dose decrease       3. Are you having a reaction (difficulty breathing--STAT)? No   4. What is your medication issue? Pt is requesting a callback regarding her concerns with this medication. She doesn't like it, its making her BP go up during the day and sleepy. Please advise

## 2022-12-18 MED ORDER — LOSARTAN POTASSIUM 50 MG PO TABS: 50.0000 mg | ORAL_TABLET | Freq: Two times a day (BID) | ORAL | 1 refills | Status: DC

## 2022-12-18 NOTE — Telephone Encounter (Signed)
Patient is returning call. Please advise? 

## 2022-12-18 NOTE — Telephone Encounter (Signed)
The patient has been made aware. A new script has been sent in.

## 2022-12-18 NOTE — Telephone Encounter (Signed)
Pt stated she'd been waiting for someone to call her back since she called back on 6/27 but she still hasn't heard anything. Please advise

## 2023-01-19 ENCOUNTER — Telehealth: Payer: Self-pay | Admitting: Internal Medicine

## 2023-01-19 MED ORDER — LOSARTAN POTASSIUM 50 MG PO TABS: 50.0000 mg | ORAL_TABLET | Freq: Two times a day (BID) | ORAL | 2 refills | Status: DC

## 2023-01-19 NOTE — Telephone Encounter (Signed)
Requested Prescriptions   Signed Prescriptions Disp Refills   losartan (COZAAR) 50 MG tablet 180 tablet 2    Sig: Take 1 tablet (50 mg total) by mouth 2 (two) times daily.    Authorizing Provider: END, CHRISTOPHER    Ordering User: Guerry Minors

## 2023-01-19 NOTE — Telephone Encounter (Signed)
*  STAT* If patient is at the pharmacy, call can be transferred to refill team.   1. Which medications need to be refilled? (please list name of each medication and dose if known)   losartan (COZAAR) 50 MG tablet    2. Which pharmacy/location (including street and city if local pharmacy) is medication to be sent to? [facility] [address] [place] - [address]    3. Do they need a 30 day or 90 day supply? 90 day

## 2023-01-26 DIAGNOSIS — D0439 Carcinoma in situ of skin of other parts of face: Secondary | ICD-10-CM | POA: Diagnosis not present

## 2023-01-26 DIAGNOSIS — D485 Neoplasm of uncertain behavior of skin: Secondary | ICD-10-CM | POA: Diagnosis not present

## 2023-06-07 ENCOUNTER — Other Ambulatory Visit: Payer: Self-pay | Admitting: Internal Medicine

## 2023-07-07 ENCOUNTER — Encounter: Payer: PPO | Attending: Physician Assistant | Admitting: Physician Assistant

## 2023-07-07 DIAGNOSIS — L97822 Non-pressure chronic ulcer of other part of left lower leg with fat layer exposed: Secondary | ICD-10-CM | POA: Insufficient documentation

## 2023-07-07 DIAGNOSIS — I2510 Atherosclerotic heart disease of native coronary artery without angina pectoris: Secondary | ICD-10-CM | POA: Diagnosis not present

## 2023-07-07 DIAGNOSIS — I87332 Chronic venous hypertension (idiopathic) with ulcer and inflammation of left lower extremity: Secondary | ICD-10-CM | POA: Insufficient documentation

## 2023-07-07 DIAGNOSIS — I480 Paroxysmal atrial fibrillation: Secondary | ICD-10-CM | POA: Insufficient documentation

## 2023-07-09 NOTE — Progress Notes (Signed)
Julie Duncan ([id number]) [phone] [id number] Page 1 of 5 Visit Report for 07/07/2023 Abuse Risk Screen Details Patient Name: Date of Service: Julie Medico NN Duncan. 07/07/2023 1:30 PM Medical Record Number: [id number] Patient Account Number: [id number] Date of Birth/Sex: Treating RN: September 08, 1940 (83 y.o. [name] Primary Care Julie Duncan: Tillman Abide Other Clinician: Referring Yukiko Minnich: Treating Jelitza Manninen/Extender: Allen Derry Self, Referral Weeks in Treatment: 0 Abuse Risk Screen Items Answer ABUSE RISK SCREEN: Has anyone close to you tried to hurt or harm you recentlyo No Do you feel uncomfortable with anyone in your familyo No Has anyone forced you do things that you didnt want to doo No Electronic Signature(s) Signed: 07/09/2023 8:18:57 AM By: Yevonne Pax RN Entered By: Yevonne Pax on 07/07/2023 13:45:41 -------------------------------------------------------------------------------- Activities of Daily Living Details Patient Name: Date of Service: Julie Medico NN Duncan. 07/07/2023 1:30 PM Medical Record Number: [id number] Patient Account Number: [id number] Date of Birth/Sex: Treating RN: [birth year] (83 y.o. [name] Primary Care Quianna Avery: Tillman Abide Other Clinician: Referring Abbas Beyene: Treating Deago Burruss/Extender: Allen Derry Self, Referral Weeks in Treatment: 0 Activities of Daily Living Items Answer Activities of Daily Living (Please select one for each item) Drive Automobile Completely Able T Medications ake Completely Able Use T elephone Completely Able Care for Appearance Completely Able Use T oilet Completely Able Bath / Shower Completely Able Dress Self Completely Able Feed Self Completely Able Walk Completely Able Get In / Out Bed Completely Able Housework Completely Julie Duncan ([id number]) [phone] [id number] Page 2 of 5 Prepare Meals Completely Able Handle Money Completely Able Shop for  Self Completely Able Electronic Signature(s) Signed: 07/09/2023 8:18:57 AM By: Yevonne Pax RN Entered By: Yevonne Pax on 07/07/2023 13:46:03 -------------------------------------------------------------------------------- Education Screening Details Patient Name: Date of Service: Julie BB, JO A NN Duncan. 07/07/2023 1:30 PM Medical Record Number: [id number] Patient Account Number: [id number] Date of Birth/Sex: Treating RN: Dec 05, 1940 (83 y.o. [name] Primary Care Reco Shonk: Tillman Abide Other Clinician: Referring Seleny Allbright: Treating Jamariyah Johannsen/Extender: Allen Derry Self, Referral Weeks in Treatment: 0 Primary Learner Assessed: Patient Learning Preferences/Education Level/Primary Language Learning Preference: Explanation Highest Education Level: College or Above Preferred Language: English Cognitive Barrier Language Barrier: No Translator Needed: No Memory Deficit: No Emotional Barrier: No Cultural/Religious Beliefs Affecting Medical Care: No Physical Barrier Impaired Vision: Yes Glasses Impaired Hearing: No Decreased Hand dexterity: No Knowledge/Comprehension Knowledge Level: Medium Comprehension Level: Medium Ability to understand written instructions: Medium Ability to understand verbal instructions: Medium Motivation Anxiety Level: Anxious Cooperation: Cooperative Education Importance: Acknowledges Need Interest in Health Problems: Asks Questions Perception: Coherent Willingness to Engage in Self-Management Medium Activities: Readiness to Engage in Self-Management Medium Activities: Electronic Signature(s) Signed: 07/09/2023 8:18:57 AM By: Yevonne Pax RN Entered By: Yevonne Pax on 07/07/2023 13:46:56 [name] ([id number]) [id number] Page 3 of 5 -------------------------------------------------------------------------------- Fall Risk Assessment Details Patient Name: Date of Service: Julie Medico NN Duncan. 07/07/2023 1:30  PM Medical Record Number: [id number] Patient Account Number: [id number] Date of Birth/Sex: Treating RN: 18-Jun-1941 (83 y.o. [name] Primary Care Shalev Helminiak: Tillman Abide Other Clinician: Referring Nafisa Olds: Treating Devan Danzer/Extender: Allen Derry Self, Referral Weeks in Treatment: 0 Fall Risk Assessment Items Have you had 2 or more falls in the last 12 monthso 0 No Have you had any fall that resulted in injury in the last 12 monthso 0 No FALLS RISK SCREEN History of falling - immediate or within 3 months 0 No Secondary diagnosis (Do you have 2 or more medical diagnoseso) 0  No Ambulatory aid None/bed rest/wheelchair/nurse 0 Yes Crutches/cane/walker 0 No Furniture 0 No Intravenous therapy Access/Saline/Heparin Lock 0 No Gait/Transferring Normal/ bed rest/ wheelchair 0 Yes Weak (short steps with or without shuffle, stooped but able to lift head while walking, may seek 0 No support from furniture) Impaired (short steps with shuffle, may have difficulty arising from chair, head down, impaired 0 No balance) Mental Status Oriented to own ability 0 Yes Electronic Signature(s) Signed: 07/09/2023 8:18:57 AM By: Yevonne Pax RN Entered By: Yevonne Pax on 07/07/2023 13:47:15 -------------------------------------------------------------------------------- Foot Assessment Details Patient Name: Date of Service: Julie Medico NN Duncan. 07/07/2023 1:30 PM Medical Record Number: [id number] Patient Account Number: [id number] Date of Birth/Sex: Treating RN: 1941-06-05 (83 y.o. [name] Primary Care Crandall Harvel: Tillman Abide Other Clinician: Referring Agapito Hanway: Treating Renad Jenniges/Extender: Allen Derry Self, Referral Weeks in Treatment: 0 Foot Assessment Items [address] [name], [place] Bassett Duncan ([id number]) [id number] Page 4 of 5 + = Sensation present, - = Sensation absent, C = Callus, U = Ulcer R = Redness, W = Warmth, M = Maceration, PU = Pre-ulcerative  lesion F = Fissure, S = Swelling, D = Dryness Assessment Right: Left: Other Deformity: No No Prior Foot Ulcer: No No Prior Amputation: No No Charcot Joint: No No Ambulatory Status: Ambulatory Without Help Gait: Steady Electronic Signature(s) Signed: 07/09/2023 8:18:57 AM By: Yevonne Pax RN Entered By: Yevonne Pax on 07/07/2023 14:01:07 -------------------------------------------------------------------------------- Nutrition Risk Screening Details Patient Name: Date of Service: Julie Medico NN Duncan. 07/07/2023 1:30 PM Medical Record Number: [id number] Patient Account Number: [id number] Date of Birth/Sex: Treating RN: 16-Sep-1940 (83 y.o. [name] Primary Care Keiasia Christianson: Tillman Abide Other Clinician: Referring Massie Cogliano: Treating Sapphire Tygart/Extender: Allen Derry Self, Referral Weeks in Treatment: 0 Height (in): 66 Weight (lbs): 180 Body Mass Index (BMI): 29 Nutrition Risk Screening Items Score Screening NUTRITION RISK SCREEN: I have an illness or condition that made me change the kind and/or amount of food I eat 0 No I eat fewer than two meals per day 0 No I eat few fruits and vegetables, or milk products 0 No I have three or more drinks of beer, liquor or wine almost every day 0 No I have tooth or mouth problems that make it hard for me to eat 0 No I don't always have enough money to buy the food I need 0 No LORILYN, MONCAYO Duncan ([id number]) [id number] Page 5 of 5 I eat alone most of the time 0 No I take three or more different prescribed or over-the-counter drugs a day 1 Yes Without wanting to, I have lost or gained 10 pounds in the last six months 0 No I am not always physically able to shop, cook and/or feed myself 0 No Nutrition Protocols Good Risk Protocol 0 No interventions needed Moderate Risk Protocol High Risk Proctocol Risk Level: Good Risk Score: 1 Electronic Signature(s) Signed: 07/09/2023 8:18:57 AM By: Yevonne Pax RN Entered  By: Yevonne Pax on 07/07/2023 13:47:23

## 2023-07-09 NOTE — Progress Notes (Signed)
THEORY, LUCIUS ([id number]) [id number] Page 1 of 9 Visit Report for 07/07/2023 Chief Complaint Document Details Patient Name: Date of Service: Julie Duncan. 07/07/2023 1:30 PM Medical Record Number: [id number] Patient Account Number: [id number] Date of Birth/Sex: Treating RN: May 25, 1941 (83 y.o. Julie Duncan Primary Care Provider: Tillman Abide Other Clinician: Referring Provider: Treating Provider/Extender: Allen Derry Self, Referral Weeks in Treatment: 0 Information Obtained from: Patient Chief Complaint Left LE Ulcer Electronic Signature(s) Signed: 07/07/2023 2:28:47 PM By: Allen Derry PA-C Entered By: Allen Derry on 07/07/2023 14:28:47 -------------------------------------------------------------------------------- Debridement Details Patient Name: Date of Service: Julie Duncan, Julie Duncan. 07/07/2023 1:30 PM Medical Record Number: [id number] Patient Account Number: [id number] Date of Birth/Sex: Treating RN: 1940/06/23 (83 y.o. Julie Duncan Primary Care Provider: Tillman Abide Other Clinician: Referring Provider: Treating Provider/Extender: Allen Derry Self, Referral Weeks in Treatment: 0 Debridement Performed for Assessment: Wound #1 Left,Anterior Lower Leg Performed By: Physician Allen Derry, PA-C The following information was scribed by: Yevonne Pax The information was scribed for: Allen Derry Debridement Type: Debridement Severity of Tissue Pre Debridement: Fat layer exposed Level of Consciousness (Pre-procedure): Awake and Alert Pre-procedure Verification/Time Out Yes - 14:15 Taken: Start Time: 14:15 Pain Control: Lidocaine 4% T opical Solution Percent of Wound Bed Debrided: 100% T Area Debrided (cm): otal 1.77 Tissue and other material debrided: Viable, Non-Viable, Slough, Subcutaneous, Skin: Dermis , Skin: Epidermis, Biofilm, Slough Level: Skin/Subcutaneous Tissue Debridement Description: Excisional Instrument: Curette Bleeding:  Moderate Julie Duncan, Julie Duncan ([id number]) [id number] Page 2 of 9 Hemostasis Achieved: Pressure End Time: 14:39 Procedural Pain: 0 Post Procedural Pain: 0 Response to Treatment: Procedure was tolerated well Level of Consciousness (Post- Awake and Alert procedure): Post Debridement Measurements of Total Wound Length: (cm) 1.5 Width: (cm) 1.5 Depth: (cm) 0.2 Volume: (cm) 0.353 Character of Wound/Ulcer Post Debridement: Improved Severity of Tissue Post Debridement: Fat layer exposed Post Procedure Diagnosis Same as Pre-procedure Electronic Signature(s) Signed: 07/07/2023 2:54:19 PM By: Yevonne Pax RN Signed: 07/08/2023 6:05:15 PM By: Allen Derry PA-C Entered By: Yevonne Pax on 07/07/2023 14:54:19 -------------------------------------------------------------------------------- HPI Details Patient Name: Date of Service: Julie Duncan, Julie Duncan. 07/07/2023 1:30 PM Medical Record Number: [id number] Patient Account Number: [id number] Date of Birth/Sex: Treating RN: Dec 21, 1940 (83 y.o. Julie Duncan Primary Care Provider: Tillman Abide Other Clinician: Referring Provider: Treating Provider/Extender: Allen Derry Self, Referral Weeks in Treatment: 0 History of Present Illness Chronic/Inactive Conditions Condition 1: 07-07-23 patient unfortunately appears to have peripheral vascular disease. Subsequently her ABI that we did on the left could only be obtained from the PT and actually showed an ABI of 0.3 to the unsure if this is really legitimate and accurate or not. Nonetheless I do not feel like she has good arterial flow with a capillary refill around 8 seconds and overall slow wound healing. HPI Description: 07-07-23 upon evaluation today patient presents for initial inspection here in our clinic concerning issues she has been having with her left anterior lower extremity as a result of having dropped a stool on this area. She tells me that this was back on June 14, 2023 and has not improved since that time. Fortunately there does not appear to be any signs of active infection at this time looking or systemically she did have a large hematoma at the site whenever this occurred. She has been cleaning it with peroxide regularly. Patient does have a history of what appears to be significant in regard to cardiac including issues with her coronary arteries  as well as with her arrhythmias which have been quite significant she does see Dr. Cristal Deer in the past. However the patient states that she does not "have any of those issues". Looking back at notes it looks like she took herself off of the medications herself in regard to her blood thinners. She was recommended to continue with the aspirin. That was back May 2020 for which she saw Dr. Okey Dupre last. The patient does have a history of chronic venous hypertension as well. Electronic Signature(s) Signed: 07/07/2023 3:18:07 PM By: Allen Derry PA-C Entered By: Allen Derry on 07/07/2023 15:18:06 Julie Duncan ([id number]) [id number] Page 3 of 9 -------------------------------------------------------------------------------- Physical Exam Details Patient Name: Date of Service: Julie Duncan. 07/07/2023 1:30 PM Medical Record Number: [id number] Patient Account Number: [id number] Date of Birth/Sex: Treating RN: [birth year] (83 y.o. Julie Duncan Primary Care Provider: Tillman Abide Other Clinician: Referring Provider: Treating Provider/Extender: Allen Derry Self, Referral Weeks in Treatment: 0 Constitutional patient is hypertensive.. pulse regular and within target range for patient.Marland Kitchen respirations regular, non-labored and within target range for patient.Marland Kitchen temperature within target range for patient.. Well-nourished and well-hydrated in no acute distress. Eyes conjunctiva clear no eyelid edema noted. pupils equal round and reactive to light and accommodation. Ears, Nose, Mouth, and  Throat no gross abnormality of ear auricles or external auditory canals. normal hearing noted during conversation. mucus membranes moist. Respiratory normal breathing without difficulty. Cardiovascular Absent posterior tibial and dorsalis pedis pulses bilateral lower extremities. 1+ pitting edema of the bilateral lower extremities. Psychiatric this patient is able to make decisions and demonstrates good insight into disease process. Alert and Oriented x 3. pleasant and cooperative. Notes Upon inspection patient's wound bed actually showed signs of good granulation and epithelization at this point. Fortunately I do not see any signs of active infection at this time which is great news. With that being said she does appear to have some slough and biofilm buildup I did discuss with her the possibility of going ahead and proceeding with sharp debridement she was in agreement to give this a trial. I was able to remove a majority of the necrotic debris which she tolerated without complication. With that being said unfortunately I was not able to clear this completely away she had an area right in the middle that seem to be a plug of necrotic tissue/hematoma which I think is going to be removed but right now she is not able to do that due to the pain I was able to remove is much as I could but not complete. Electronic Signature(s) Signed: 07/07/2023 3:19:46 PM By: Allen Derry PA-C Entered By: Allen Derry on 07/07/2023 15:19:46 -------------------------------------------------------------------------------- Physician Orders Details Patient Name: Date of Service: Julie Duncan, Julie Duncan. 07/07/2023 1:30 PM Medical Record Number: [id number] Patient Account Number: [id number] Date of Birth/Sex: Treating RN: 04-17-1941 (83 y.o. Julie Duncan Primary Care Provider: Tillman Abide Other Clinician: Referring Provider: Treating Provider/Extender: Allen Derry Self, Referral Weeks in Treatment: 0 The following  information was scribed by: Yevonne Pax The information was scribed for: Indria, Dood ([id number]) [id number] Page 4 of 9 Verbal / Phone Orders: No Diagnosis Coding ICD-10 Coding Code Description [phone] Chronic venous hypertension (idiopathic) with ulcer and inflammation of left lower extremity L97.822 Non-pressure chronic ulcer of other part of left lower leg with fat layer exposed I25.10 Atherosclerotic heart disease of native coronary artery without angina pectoris I48.0 Paroxysmal atrial fibrillation Follow-up Appointments Return Appointment in 1  week. Bathing/ Shower/ Hygiene May shower; gently cleanse wound with antibacterial soap, rinse and pat dry prior to dressing wounds Edema Control - Orders / Instructions Elevate, Exercise Daily and A void Standing for Long Periods of Time. Elevate legs to the level of the heart and pump ankles as often as possible Elevate leg(s) parallel to the floor when sitting. Wound Treatment Wound #1 - Lower Leg Wound Laterality: Left, Anterior Cleanser: Soap and Water 1 x Per Day/30 Days Discharge Instructions: Gently cleanse wound with antibacterial soap, rinse and pat dry prior to dressing wounds Prim Dressing: Xeroform-HBD 2x2 (in/in) 1 x Per Day/30 Days ary Discharge Instructions: Apply Xeroform-HBD 2x2 (in/in) as directed Secondary Dressing: Coverlet Latex-Free Fabric Adhesive Dressings 1 x Per Day/30 Days Discharge Instructions: 1.5 x 2 Compression Wrap: tibi grip 1 x Per Day/30 Days Discharge Instructions: size E Consults Vascular - ABI and TBI - (ICD10 W09.811 - Non-pressure chronic ulcer of other part of left lower leg with fat layer exposed) Electronic Signature(s) Signed: 07/08/2023 6:05:15 PM By: Allen Derry PA-C Signed: 07/09/2023 8:18:57 AM By: Yevonne Pax RN Entered By: Yevonne Pax on 07/07/2023  14:41:30 -------------------------------------------------------------------------------- Problem List Details Patient Name: Date of Service: Julie Duncan, Julie Duncan. 07/07/2023 1:30 PM Medical Record Number: [id number] Patient Account Number: [id number] Date of Birth/Sex: Treating RN: 1940-11-01 (83 y.o. Julie Duncan Primary Care Provider: Tillman Abide Other Clinician: Referring Provider: Treating Provider/Extender: Allen Derry Self, Referral Weeks in Treatment: 0 Active Problems ICD-10 Encounter Code Description Active Date MDM Diagnosis BLAKELY, FUHRIMAN ([id number]) [id number] Page 5 of 9 [phone] Chronic venous hypertension (idiopathic) with ulcer and inflammation of left 07/07/2023 No Yes lower extremity L97.822 Non-pressure chronic ulcer of other part of left lower leg with fat layer exposed1/15/2025 No Yes I25.10 Atherosclerotic heart disease of native coronary artery without angina pectoris 07/07/2023 No Yes I48.0 Paroxysmal atrial fibrillation 07/07/2023 No Yes Inactive Problems Resolved Problems Electronic Signature(s) Signed: 07/07/2023 2:28:32 PM By: Allen Derry PA-C Entered By: Allen Derry on 07/07/2023 14:28:32 -------------------------------------------------------------------------------- Progress Note Details Patient Name: Date of Service: Julie Duncan, Julie Duncan. 07/07/2023 1:30 PM Medical Record Number: [id number] Patient Account Number: [id number] Date of Birth/Sex: Treating RN: [birth year] (83 y.o. Julie Duncan Primary Care Provider: Tillman Abide Other Clinician: Referring Provider: Treating Provider/Extender: Allen Derry Self, Referral Weeks in Treatment: 0 Subjective Chief Complaint Information obtained from Patient Left LE Ulcer History of Present Illness (HPI) Chronic/Inactive Condition: 07-07-23 patient unfortunately appears to have peripheral vascular disease. Subsequently her ABI that we did on the left could only be obtained from  the PT and actually showed an ABI of 0.3 to the unsure if this is really legitimate and accurate or not. Nonetheless I do not feel like she has good arterial flow with a capillary refill around 8 seconds and overall slow wound healing. 07-07-23 upon evaluation today patient presents for initial inspection here in our clinic concerning issues she has been having with her left anterior lower extremity as a result of having dropped a stool on this area. She tells me that this was back on June 14, 2023 and has not improved since that time. Fortunately there does not appear to be any signs of active infection at this time looking or systemically she did have a large hematoma at the site whenever this occurred. She has been cleaning it with peroxide regularly. Patient does have a history of what appears to be significant in regard to cardiac including issues with her coronary arteries  as well as with her arrhythmias which have been quite significant she does see Dr. Cristal Deer in the past. However the patient states that she does not "have any of those issues". Looking back at notes it looks like she took herself off of the medications herself in regard to her blood thinners. She was recommended to continue with the aspirin. That was back May 2020 for which she saw Dr. Okey Dupre last. The patient does have a history of chronic venous hypertension as well. Patient History Allergies cortisone, red yeast rice, saccharin, warfarin Social History Never smoker, Marital Status - Widowed, Alcohol Use - Never, Drug Use - No History, Caffeine Use - Never. Julie Duncan, Julie Duncan ([id number]) [id number] Page 6 of 9 Medical History Cardiovascular Patient has history of Arrhythmia, Coronary Artery Disease Review of Systems (ROS) Integumentary (Skin) Complains or has symptoms of Wounds. Objective Constitutional patient is hypertensive.. pulse regular and within target range for patient.Marland Kitchen  respirations regular, non-labored and within target range for patient.Marland Kitchen temperature within target range for patient.. Well-nourished and well-hydrated in no acute distress. Vitals Time Taken: 1:41 PM, Height: 66 in, Source: Stated, Weight: 180 lbs, Source: Stated, BMI: 29, Temperature: 98.2 F, Pulse: 106 bpm, Respiratory Rate: 20 breaths/min, Blood Pressure: 156/79 mmHg. Eyes conjunctiva clear no eyelid edema noted. pupils equal round and reactive to light and accommodation. Ears, Nose, Mouth, and Throat no gross abnormality of ear auricles or external auditory canals. normal hearing noted during conversation. mucus membranes moist. Respiratory normal breathing without difficulty. Cardiovascular Absent posterior tibial and dorsalis pedis pulses bilateral lower extremities. 1+ pitting edema of the bilateral lower extremities. Psychiatric this patient is able to make decisions and demonstrates good insight into disease process. Alert and Oriented x 3. pleasant and cooperative. General Notes: Upon inspection patient's wound bed actually showed signs of good granulation and epithelization at this point. Fortunately I do not see any signs of active infection at this time which is great news. With that being said she does appear to have some slough and biofilm buildup I did discuss with her the possibility of going ahead and proceeding with sharp debridement she was in agreement to give this a trial. I was able to remove a majority of the necrotic debris which she tolerated without complication. With that being said unfortunately I was not able to clear this completely away she had an area right in the middle that seem to be a plug of necrotic tissue/hematoma which I think is going to be removed but right now she is not able to do that due to the pain I was able to remove is much as I could but not complete. Integumentary (Hair, Skin) Wound #1 status is Open. Original cause of wound was Trauma. The  date acquired was: 06/14/2023. The wound is located on the Left,Anterior Lower Leg. The wound measures 1.5cm length x 1.5cm width x 0.2cm depth; 1.767cm^2 area and 0.353cm^3 volume. There is Fat Layer (Subcutaneous Tissue) exposed. There is no tunneling or undermining noted. There is a medium amount of serosanguineous drainage noted. There is small (1-33%) red granulation within the wound bed. There is a large (67-100%) amount of necrotic tissue within the wound bed including Adherent Slough. Assessment Active Problems ICD-10 Chronic venous hypertension (idiopathic) with ulcer and inflammation of left lower extremity Non-pressure chronic ulcer of other part of left lower leg with fat layer exposed Atherosclerotic heart disease of native coronary artery without angina pectoris Paroxysmal atrial fibrillation Procedures Wound #1 Pre-procedure diagnosis of Wound #1  is a Venous Leg Ulcer located on the Left,Anterior Lower Leg .Severity of Tissue Pre Debridement is: Fat layer exposed. There was a Excisional Skin/Subcutaneous Tissue Debridement with a total area of 1.77 sq cm performed by Allen Derry, PA-C. With the following instrument(s): Curette to remove Viable and Non-Viable tissue/material. Material removed includes Subcutaneous Tissue, Slough, Skin: Dermis, Skin: Epidermis, and Biofilm after achieving pain control using Lidocaine 4% Topical Solution. No specimens were taken. A time out was conducted at 14:15, prior to the start of the procedure. A Moderate amount of bleeding was controlled with Pressure. The procedure was tolerated well with a pain level of 0 throughout and a pain level of 0 following the procedure. Post Debridement Measurements: 1.5cm length x 1.5cm width x 0.2cm depth; 0.353cm^3 volume. Character of Wound/Ulcer Post Debridement is improved. Severity of Tissue Post Debridement is: Fat layer exposed. Post procedure Diagnosis Wound #1: Same as Pre-Procedure Julie Duncan, Julie Duncan  ([id number]) [id number] Page 7 of 9 Plan Follow-up Appointments: Return Appointment in 1 week. Bathing/ Shower/ Hygiene: May shower; gently cleanse wound with antibacterial soap, rinse and pat dry prior to dressing wounds Edema Control - Orders / Instructions: Elevate, Exercise Daily and Avoid Standing for Long Periods of Time. Elevate legs to the level of the heart and pump ankles as often as possible Elevate leg(s) parallel to the floor when sitting. Consults ordered were: Vascular - ABI and TBI WOUND #1: - Lower Leg Wound Laterality: Left, Anterior Cleanser: Soap and Water 1 x Per Day/30 Days Discharge Instructions: Gently cleanse wound with antibacterial soap, rinse and pat dry prior to dressing wounds Prim Dressing: Xeroform-HBD 2x2 (in/in) 1 x Per Day/30 Days ary Discharge Instructions: Apply Xeroform-HBD 2x2 (in/in) as directed Secondary Dressing: Coverlet Latex-Free Fabric Adhesive Dressings 1 x Per Day/30 Days Discharge Instructions: 1.5 x 2 Com pression Wrap: tibi grip 1 x Per Day/30 Days Discharge Instructions: size E 1. Based on what I am seeing I do believe that the patient would benefit currently from a further sharp debridement but we will have to loosen and soften some of this up as it was too painful for her right now. 2. I am going to recommend as well that the patient should continue with the Xeroform gauze dressing to try to soften this up we will do this over the next week. 3. I am going to recommend the patient should continue with a coverlet using some gauze underneath to get something to drain into and then subsequently when using Tubigrip size E for some mild compression nothing too tight. She was in agreement with giving this a trial over the next week we will see how things proceed. We will see patient back for reevaluation in 1 week here in the clinic. If anything worsens or changes patient will contact our office for  additional recommendations. Electronic Signature(s) Signed: 07/07/2023 3:20:28 PM By: Allen Derry PA-C Entered By: Allen Derry on 07/07/2023 15:20:28 -------------------------------------------------------------------------------- ROS/PFSH Details Patient Name: Date of Service: Julie Duncan, Julie Duncan. 07/07/2023 1:30 PM Medical Record Number: [id number] Patient Account Number: [id number] Date of Birth/Sex: Treating RN: 1941/05/29 (83 y.o. Julie Duncan Primary Care Provider: Tillman Abide Other Clinician: Referring Provider: Treating Provider/Extender: Allen Derry Self, Referral Weeks in Treatment: 0 Integumentary (Skin) Complaints and Symptoms: Positive for: Wounds Cardiovascular Medical History: Positive for: Arrhythmia; Coronary Artery Disease Immunizations Pneumococcal Vaccine: Received Pneumococcal Vaccination: No Julie Duncan, Julie Duncan ([id number]) [id number] Page 8 of 9 Implantable Devices None Family and Social History Never smoker; Marital  Status - Widowed; Alcohol Use: Never; Drug Use: No History; Caffeine Use: Never Social Determinants of Health (SDOH) 1. In the past 2 months, did you or others you live with eat smaller meals or skip meals because you didn't have money for foodo : No 2. Are you homeless or worried that you might be in the futureo : No 3. Do you have trouble paying for your utilities (gas, electricity, phone)o : No 4. Do you have trouble finding or paying for a rideo : No 5. Do you need daycare, or better daycare, for your kidso : No 6. Are you unemployed or without regular incomeo : No 7. Do you need help finding a better jobo : No 8. Do you need help getting more educationo : No 9. Are you concerned about someone in your home using drugs or alcoholo : No 10. Do you feel unsafe in your daily lifeo : No 11. Is anyone in your home threatening or abusing youo : No 12. Do you lack quality relationships that make you feel valued and  supportedo : No 13. Do you need help getting cultural information in a language you understando : No 14. Do you need help getting internet accesso : No Advanced Directives and Instructions Spiritual or Cultural beliefs preclude asking about Advance Care Planning: No Advanced Directives: No Patient wants information on Advanced Directives: No Do not resuscitate: No Living Will: No Medical Power of Attorney: No Surrogate Decision Maker: No Electronic Signature(s) Signed: 07/08/2023 6:05:15 PM By: Allen Derry PA-C Signed: 07/09/2023 8:18:57 AM By: Yevonne Pax RN Entered By: Yevonne Pax on 07/07/2023 13:45:36 -------------------------------------------------------------------------------- SuperBill Details Patient Name: Date of Service: Julie Duncan, Julie Duncan. 07/07/2023 Medical Record Number: [id number] Patient Account Number: [id number] Date of Birth/Sex: Treating RN: 23-Jun-1940 (83 y.o. Julie Duncan Primary Care Provider: Tillman Abide Other Clinician: Referring Provider: Treating Provider/Extender: Allen Derry Self, Referral Weeks in Treatment: 0 Diagnosis Coding ICD-10 Codes Code Description [phone] Chronic venous hypertension (idiopathic) with ulcer and inflammation of left lower extremity L97.822 Non-pressure chronic ulcer of other part of left lower leg with fat layer exposed I25.10 Atherosclerotic heart disease of native coronary artery without angina pectoris I48.0 Paroxysmal atrial fibrillation Facility Procedures : CPT4 Code: [id number] Description: 63875 - WOUND CARE VISIT-LEV 2 EST PT Modifier: Quantity: 1 Julie Duncan, Julie Duncan Code: [id number] Julie Duncan ([id number]) Description: 11042 - DEB SUBQ TISSUE 20 SQ CM/< ICD-10 Diagnosis Description [id number] T73.220 Non-pressure chronic ulcer of other part of left lower leg with fat layer expos Modifier: [id number] Quantity: 1 [id number] Page 9 of 9 Physician Procedures : CPT4 Code Description Modifier [id number] WC PHYS  LEVEL 3 NEW PT 25 ICD-10 Diagnosis Description I87.332 Chronic venous hypertension (idiopathic) with ulcer and inflammation of left lower extremity L97.822 Non-pressure chronic ulcer of other part of left  lower leg with fat layer exposed I25.10 Atherosclerotic heart disease of native coronary artery without angina pectoris I48.0 Paroxysmal atrial fibrillation Quantity: 1 : 11042 11042 - WC PHYS SUBQ TISS 20 SQ CM ICD-10 Diagnosis Description L97.822 Non-pressure chronic ulcer of other part of left lower leg with fat layer exposed Quantity: 1 Electronic Signature(s) Signed: 07/07/2023 5:37:37 PM By: Allen Derry PA-C Entered By: Allen Derry on 07/07/2023 17:37:37

## 2023-07-09 NOTE — Progress Notes (Signed)
BAISLEY, GASSNER ([id number]) [id number] Page 1 of 9 Visit Report for 07/07/2023 Allergy List Details Patient Name: Date of Service: Julie Duncan. 07/07/2023 1:30 PM Medical Record Number: [id number] Patient Account Number: [id number] Date of Birth/Sex: Treating RN: 1940-11-26 (83 y.o. Julie Duncan Primary Care Julie Duncan: Julie Duncan Other Clinician: Referring Julie Duncan: Treating Julie Duncan/Extender: Julie Duncan Self, Referral Weeks in Treatment: 0 Allergies Active Allergies cortisone red yeast rice saccharin warfarin Allergy Notes Electronic Signature(s) Signed: 07/09/2023 8:18:57 AM By: Julie Pax RN Entered By: Julie Duncan on 07/07/2023 13:43:43 -------------------------------------------------------------------------------- Arrival Information Details Patient Name: Date of Service: Julie Duncan, Julie Duncan. 07/07/2023 1:30 PM Medical Record Number: [id number] Patient Account Number: [id number] Date of Birth/Sex: Treating RN: Jan 01, 1941 (83 y.o. Julie Duncan Primary Care Julie Duncan: Julie Duncan Other Clinician: Referring Adonte Vanriper: Treating Tyarra Duncan/Extender: Julie Duncan Self, Referral Weeks in Treatment: 0 Visit Information Patient Arrived: Ambulatory Arrival Time: 13:39 Accompanied By: self Transfer Assistance: None Patient Identification Verified: No Secondary Verification Process Completed: No Patient Requires Transmission-Based Precautions: No Patient Has Alerts: No Julie Duncan, Julie Duncan ([id number]) [id number] Page 2 of 9 Electronic Signature(s) Signed: 07/09/2023 8:18:57 AM By: Julie Pax RN Entered By: Julie Duncan on 07/07/2023 13:39:51 -------------------------------------------------------------------------------- Clinic Level of Care Assessment Details Patient Name: Date of Service: Julie Duncan. 07/07/2023 1:30 PM Medical Record Number: [id number] Patient Account Number: [id number] Date of Birth/Sex:  Treating RN: Julie 26, 1942 (83 y.o. Julie Duncan Primary Care Julie Duncan: Julie Duncan Other Clinician: Referring Kimala Horne: Treating Abishai Viegas/Extender: Julie Duncan Self, Referral Weeks in Treatment: 0 Clinic Level of Care Assessment Items TOOL 1 Quantity Score X- 1 0 Use when EandM and Procedure is performed on INITIAL visit ASSESSMENTS - Nursing Assessment / Reassessment X- 1 20 General Physical Exam (combine w/ comprehensive assessment (listed just below) when performed on new pt. evals) X- 1 25 Comprehensive Assessment (HX, ROS, Risk Assessments, Wounds Hx, etc.) ASSESSMENTS - Wound and Skin Assessment / Reassessment []  - 0 Dermatologic / Skin Assessment (not related to wound area) ASSESSMENTS - Ostomy and/or Continence Assessment and Care []  - 0 Incontinence Assessment and Management []  - 0 Ostomy Care Assessment and Management (repouching, etc.) PROCESS - Coordination of Care X - Simple Patient / Family Education for ongoing care 1 15 []  - 0 Complex (extensive) Patient / Family Education for ongoing care []  - 0 Staff obtains Chiropractor, Records, T Results / Process Orders est []  - 0 Staff telephones HHA, Nursing Homes / Clarify orders / etc []  - 0 Routine Transfer to another Facility (non-emergent condition) []  - 0 Routine Hospital Admission (non-emergent condition) []  - 0 New Admissions / Manufacturing engineer / Ordering NPWT Apligraf, etc. , []  - 0 Emergency Hospital Admission (emergent condition) PROCESS - Special Needs []  - 0 Pediatric / Minor Patient Management []  - 0 Isolation Patient Management []  - 0 Hearing / Language / Visual special needs []  - 0 Assessment of Community assistance (transportation, D/C planning, etc.) []  - 0 Additional assistance / Altered mentation []  - 0 Support Surface(s) Assessment (bed, cushion, seat, etc.) INTERVENTIONS - Miscellaneous []  - 0 External ear exam [name], [name] ([id number])  [phone].pdf Page 3 of 9 []  - 0 Patient Transfer (multiple staff / Nurse, adult / Similar devices) []  - 0 Simple Staple / Suture removal (25 or less) []  - 0 Complex Staple / Suture removal (26 or more) []  - 0 Hypo/Hyperglycemic Management (do not check if billed separately) X- 1 15 Ankle / Brachial Index (ABI) -  do not check if billed separately Has the patient been seen at the hospital within the last three years: Yes Total Score: 75 Level Of Care: New/Established - Level 2 Electronic Signature(s) Signed: 07/09/2023 8:18:57 AM By: Julie Pax RN Entered By: Julie Duncan on 07/07/2023 14:51:57 -------------------------------------------------------------------------------- Encounter Discharge Information Details Patient Name: Date of Service: Julie Duncan, Julie Duncan. 07/07/2023 1:30 PM Medical Record Number: [id number] Patient Account Number: [id number] Date of Birth/Sex: Treating RN: [birth year] (83 y.o. Julie Duncan Primary Care Gwendola Hornaday: Julie Duncan Other Clinician: Referring Yashira Offenberger: Treating Dorwin Fitzhenry/Extender: Julie Duncan Self, Referral Weeks in Treatment: 0 Encounter Discharge Information Items Post Procedure Vitals Discharge Condition: Stable Temperature (F): 98.2 Ambulatory Status: Ambulatory Pulse (bpm): 106 Discharge Destination: Home Respiratory Rate (breaths/min): 20 Transportation: Private Auto Blood Pressure (mmHg): 156/79 Accompanied By: self Schedule Follow-up Appointment: Yes Clinical Summary of Care: Electronic Signature(s) Signed: 07/07/2023 2:53:56 PM By: Julie Pax RN Entered By: Julie Duncan on 07/07/2023 14:53:55 -------------------------------------------------------------------------------- Lower Extremity Assessment Details Patient Name: Date of Service: Julie Duncan. 07/07/2023 1:30 PM Medical Record Number: [id number] Patient Account Number: [id number] Date of Birth/Sex: Treating RN: [birth year] (83 y.o. Julie Duncan Primary Care Jalayia Bagheri: Julie Duncan Other Clinician: Referring Joslynne Klatt: Treating Doris Mcgilvery/Extender: Julie Duncan Self, Referral Weeks in TreatmentMARIYANA, Julie Duncan ([id number]) [id number] Page 4 of 9 Edema Assessment Assessed: [Left: No] [Right: No] Edema: [Left: Ye] [Right: s] Calf Left: Right: Point of Measurement: 30 cm From Medial Instep 38.5 cm Ankle Left: Right: Point of Measurement: 10 cm From Medial Instep 22 cm Knee To Floor Left: Right: From Medial Instep 43 cm Vascular Assessment Pulses: Dorsalis Pedis Palpable: [Left:Yes] Extremity colors, hair growth, and conditions: Extremity Color: [Left:Hyperpigmented] Hair Growth on Extremity: [Left:No] Temperature of Extremity: [Left:Warm] Capillary Refill: [Left:< 3 seconds] Dependent Rubor: [Left:No] Blanched when Elevated: [Left:No] Lipodermatosclerosis: [Left:No] Blood Pressure: Brachial: [Left:156] Ankle: [Left:Dorsalis Pedis: 50 0.32] Toe Nail Assessment Left: Right: Thick: No Discolored: No Deformed: No Improper Length and Hygiene: No Electronic Signature(s) Signed: 07/09/2023 8:18:57 AM By: Julie Pax RN Entered By: Julie Duncan on 07/07/2023 14:04:00 -------------------------------------------------------------------------------- Multi Wound Chart Details Patient Name: Date of Service: Julie Raider A NN Duncan. 07/07/2023 1:30 PM Medical Record Number: [id number] Patient Account Number: [id number] Date of Birth/Sex: Treating RN: Sep 07, 1940 (83 y.o. Julie Duncan Primary Care Cale Decarolis: Julie Duncan Other Clinician: Referring Jamariah Tony: Treating Nicholad Kautzman/Extender: Julie Duncan Self, Referral Weeks in Treatment: 0 Vital Signs Height(in): 66 Pulse(bpm): 106 Weight(lbs): 180 Blood Pressure(mmHg): 156/79 Body Mass Index(BMI): 29 Julie Duncan, Julie Duncan ([id number]) [phone].pdf Page 5 of 9 Temperature(F): 98.2 Respiratory Rate(breaths/min): 20 [1:Photos:]  [N/A:N/A] Left, Anterior Lower Leg N/A N/A Wound Location: Trauma N/A N/A Wounding Event: Venous Leg Ulcer N/A N/A Primary Etiology: Arrhythmia, Coronary Artery Disease N/A N/A Comorbid History: 06/14/2023 N/A N/A Date Acquired: 0 N/A N/A Weeks of Treatment: Open N/A N/A Wound Status: No N/A N/A Wound Recurrence: 1.5x1.5x0.2 N/A N/A Measurements L x W x D (cm) 1.767 N/A N/A A (cm) : rea 0.353 N/A N/A Volume (cm) : Full Thickness Without Exposed N/A N/A Classification: Support Structures Medium N/A N/A Exudate Amount: Serosanguineous N/A N/A Exudate Type: red, brown N/A N/A Exudate Color: Small (1-33%) N/A N/A Granulation Amount: Red N/A N/A Granulation Quality: Large (67-100%) N/A N/A Necrotic Amount: Fat Layer (Subcutaneous Tissue): Yes N/A N/A Exposed Structures: Fascia: No Tendon: No Muscle: No Joint: No Bone: No None N/A N/A Epithelialization: Treatment Notes Electronic Signature(s) Signed: 07/09/2023 8:18:57 AM By: Julie Pax RN Entered  ByYevonne Duncan on 07/07/2023 14:04:07 -------------------------------------------------------------------------------- Multi-Disciplinary Care Plan Details Patient Name: Date of Service: Julie Duncan. 07/07/2023 1:30 PM Medical Record Number: [id number] Patient Account Number: [id number] Date of Birth/Sex: Treating RN: 1940/12/25 (83 y.o. Julie Duncan Primary Care Stefan Markarian: Julie Duncan Other Clinician: Referring Brittnye Josephs: Treating Peityn Payton/Extender: Julie Duncan Self, Referral Weeks in Treatment: 0 Active Inactive Necrotic Tissue Julie Duncan, Julie Duncan ([id number]) [id number] Page 6 of 9 Nursing Diagnoses: Knowledge deficit related to management of necrotic/devitalized tissue Goals: Necrotic/devitalized tissue will be minimized in the wound bed Date Initiated: 07/07/2023 Target Resolution Date: 08/07/2023 Goal Status: Active Interventions: Assess patient pain level pre-, during  and post procedure and prior to discharge Provide education on necrotic tissue and debridement process Notes: Wound/Skin Impairment Nursing Diagnoses: Knowledge deficit related to ulceration/compromised skin integrity Goals: Patient/caregiver will verbalize understanding of skin care regimen Date Initiated: 07/07/2023 Target Resolution Date: 08/07/2023 Goal Status: Active Ulcer/skin breakdown will have a volume reduction of 30% by week 4 Date Initiated: 07/07/2023 Target Resolution Date: 08/07/2023 Goal Status: Active Ulcer/skin breakdown will have a volume reduction of 50% by week 8 Date Initiated: 07/07/2023 Target Resolution Date: 09/04/2023 Goal Status: Active Ulcer/skin breakdown will have a volume reduction of 80% by week 12 Date Initiated: 07/07/2023 Target Resolution Date: 10/05/2023 Goal Status: Active Ulcer/skin breakdown will heal within 14 weeks Date Initiated: 07/07/2023 Target Resolution Date: 11/04/2023 Goal Status: Active Interventions: Assess patient/caregiver ability to obtain necessary supplies Assess patient/caregiver ability to perform ulcer/skin care regimen upon admission and as needed Assess ulceration(s) every visit Notes: Electronic Signature(s) Signed: 07/09/2023 8:18:57 AM By: Julie Pax RN Entered By: Julie Duncan on 07/07/2023 14:05:02 -------------------------------------------------------------------------------- Pain Assessment Details Patient Name: Date of Service: Julie Duncan. 07/07/2023 1:30 PM Medical Record Number: [id number] Patient Account Number: [id number] Date of Birth/Sex: Treating RN: 1940-11-23 (83 y.o. Julie Duncan Primary Care [place]: Julie Duncan Other Clinician: Referring Jihaad Bruschi: Treating Vasco Chong/Extender: Julie Duncan Self, Referral Weeks in Treatment: 0 Active Problems Location of Pain Severity and Description of Pain Patient Has Paino Yes Site Locations With Dressing ChangeDEIDREA, Julie Duncan ([id number])  [phone].pdf Page 7 of 9 Duration of the Pain. Constant / Intermittento Intermittent How Long Does it Lasto Hours: Minutes: 15 Rate the pain. Current Pain Level: 0 Worst Pain Level: 7 Least Pain Level: 0 Tolerable Pain Level: 5 Character of Pain Describe the Pain: Stabbing Pain Management and Medication Current Pain Management: Medication: No Cold Application: No Rest: Yes Massage: No Activity: No T.E.N.S.: No Heat Application: No Leg drop or elevation: No Is the Current Pain Management Adequate: Inadequate How does your wound impact your activities of daily livingo Sleep: Yes Bathing: No Appetite: No Relationship With Others: No Bladder Continence: No Emotions: No Bowel Continence: No Work: No Toileting: No Drive: No Dressing: No Hobbies: No Electronic Signature(s) Signed: 07/09/2023 8:18:57 AM By: Julie Pax RN Entered By: Julie Duncan on 07/07/2023 13:41:44 -------------------------------------------------------------------------------- Patient/Caregiver Education Details Patient Name: Date of Service: Julie Duncan. 1/15/2025andnbsp1:30 PM Medical Record Number: [id number] Patient Account Number: [id number] Date of Birth/Gender: Treating RN: 08/02/1940 (83 y.o. Julie Duncan Primary Care Physician: Julie Duncan Other Clinician: Referring Physician: Treating Physician/Extender: Julie Duncan Self, Referral Weeks in Treatment: 0 Education Assessment Education Provided To: Patient Education Topics Provided Wound/Skin Impairment: Handouts: Caring for Your Ulcer Methods: Explain/Verbal Julie Duncan, Julie Duncan ([id number]) [id number].pdf Page 8 of 9 Responses: State content correctly Electronic Signature(s) Signed: 07/09/2023 8:18:57 AM By: Julie Pax RN  Entered By: Julie Duncan on 07/07/2023 14:05:18 -------------------------------------------------------------------------------- Wound Assessment  Details Patient Name: Date of Service: Julie Duncan. 07/07/2023 1:30 PM Medical Record Number: [id number] Patient Account Number: [id number] Date of Birth/Sex: Treating RN: 1941/03/18 (83 y.o. Julie Duncan Primary Care Amaziah Ghosh: Julie Duncan Other Clinician: Referring Maritza Goldsborough: Treating Canaan Holzer/Extender: Julie Duncan Self, Referral Weeks in Treatment: 0 Wound Status Wound Number: 1 Primary Etiology: Venous Leg Ulcer Wound Location: Left, Anterior Lower Leg Wound Status: Open Wounding Event: Trauma Comorbid History: Arrhythmia, Coronary Artery Disease Date Acquired: 06/14/2023 Weeks Of Treatment: 0 Clustered Wound: No Photos Wound Measurements Length: (cm) 1.5 Width: (cm) 1.5 Depth: (cm) 0.2 Area: (cm) 1.767 Volume: (cm) 0.353 % Reduction in Area: % Reduction in Volume: Epithelialization: None Tunneling: No Undermining: No Wound Description Classification: Full Thickness Without Exposed Suppor Exudate Amount: Medium Exudate Type: Serosanguineous Exudate Color: red, brown t Structures Foul Odor After Cleansing: No Slough/Fibrino Yes Wound Bed Granulation Amount: Small (1-33%) Exposed Structure Granulation Quality: Red Fascia Exposed: No Necrotic Amount: Large (67-100%) Fat Layer (Subcutaneous Tissue) Exposed: Yes Necrotic Quality: Adherent Slough Tendon Exposed: No Muscle Exposed: No Joint Exposed: No Bone Exposed: No Julie Duncan, Julie Duncan ([id number]) [id number] Page 9 of 9 Treatment Notes Wound #1 (Lower Leg) Wound Laterality: Left, Anterior Cleanser Soap and Water Discharge Instruction: Gently cleanse wound with antibacterial soap, rinse and pat dry prior to dressing wounds Peri-Wound Care Topical Primary Dressing Xeroform-HBD 2x2 (in/in) Discharge Instruction: Apply Xeroform-HBD 2x2 (in/in) as directed Secondary Dressing Coverlet Latex-Free Fabric Adhesive Dressings Discharge Instruction: 1.5 x 2 Secured With Compression  Wrap tibi grip Discharge Instruction: size E Compression Stockings Add-Ons Electronic Signature(s) Signed: 07/09/2023 8:18:57 AM By: Julie Pax RN Entered By: Julie Duncan on 07/07/2023 14:03:01 -------------------------------------------------------------------------------- Vitals Details Patient Name: Date of Service: [name], Julie Duncan. 07/07/2023 1:30 PM Medical Record Number: [id number] Patient Account Number: [id number] Date of Birth/Sex: Treating RN: Jun 04, 1941 (83 y.o. Julie Duncan Primary Care Alixander Rallis: Julie Duncan Other Clinician: Referring Adrien Shankar: Treating Taiki Buckwalter/Extender: Julie Duncan Self, Referral Weeks in Treatment: 0 Vital Signs Time Taken: 13:41 Temperature (F): 98.2 Height (in): 66 Pulse (bpm): 106 Source: Stated Respiratory Rate (breaths/min): 20 Weight (lbs): 180 Blood Pressure (mmHg): 156/79 Source: Stated Reference Range: 80 - 120 mg / dl Body Mass Index (BMI): 29 Electronic Signature(s) Signed: 07/09/2023 8:18:57 AM By: Julie Pax RN Entered By: Julie Duncan on 07/07/2023 13:42:16

## 2023-07-12 ENCOUNTER — Other Ambulatory Visit (INDEPENDENT_AMBULATORY_CARE_PROVIDER_SITE_OTHER): Payer: Self-pay | Admitting: Physician Assistant

## 2023-07-12 DIAGNOSIS — I87332 Chronic venous hypertension (idiopathic) with ulcer and inflammation of left lower extremity: Secondary | ICD-10-CM

## 2023-07-12 DIAGNOSIS — L97822 Non-pressure chronic ulcer of other part of left lower leg with fat layer exposed: Secondary | ICD-10-CM

## 2023-07-14 ENCOUNTER — Encounter: Payer: PPO | Admitting: Physician Assistant

## 2023-07-14 DIAGNOSIS — I87332 Chronic venous hypertension (idiopathic) with ulcer and inflammation of left lower extremity: Secondary | ICD-10-CM | POA: Diagnosis not present

## 2023-07-21 ENCOUNTER — Encounter: Payer: PPO | Admitting: Physician Assistant

## 2023-07-21 DIAGNOSIS — I87332 Chronic venous hypertension (idiopathic) with ulcer and inflammation of left lower extremity: Secondary | ICD-10-CM | POA: Diagnosis not present

## 2023-07-26 ENCOUNTER — Ambulatory Visit: Payer: PPO | Admitting: Physician Assistant

## 2023-07-28 ENCOUNTER — Ambulatory Visit: Payer: PPO | Admitting: Physician Assistant

## 2023-09-02 ENCOUNTER — Ambulatory Visit: Admitting: Family

## 2023-09-02 ENCOUNTER — Encounter: Payer: Self-pay | Admitting: Family

## 2023-09-02 VITALS — BP 144/86 | HR 91 | Temp 98.4°F | Ht 63.0 in | Wt 183.0 lb

## 2023-09-02 DIAGNOSIS — J0110 Acute frontal sinusitis, unspecified: Secondary | ICD-10-CM

## 2023-09-02 DIAGNOSIS — Z8679 Personal history of other diseases of the circulatory system: Secondary | ICD-10-CM

## 2023-09-02 DIAGNOSIS — J301 Allergic rhinitis due to pollen: Secondary | ICD-10-CM

## 2023-09-02 DIAGNOSIS — I10 Essential (primary) hypertension: Secondary | ICD-10-CM

## 2023-09-02 DIAGNOSIS — R7303 Prediabetes: Secondary | ICD-10-CM

## 2023-09-02 DIAGNOSIS — G4719 Other hypersomnia: Secondary | ICD-10-CM

## 2023-09-02 DIAGNOSIS — R5383 Other fatigue: Secondary | ICD-10-CM

## 2023-09-02 DIAGNOSIS — N1831 Chronic kidney disease, stage 3a: Secondary | ICD-10-CM

## 2023-09-02 DIAGNOSIS — E782 Mixed hyperlipidemia: Secondary | ICD-10-CM | POA: Diagnosis not present

## 2023-09-02 DIAGNOSIS — I517 Cardiomegaly: Secondary | ICD-10-CM

## 2023-09-02 LAB — CBC WITH DIFFERENTIAL/PLATELET
Basophils Absolute: 0 10*3/uL (ref 0.0–0.1)
Basophils Relative: 0.7 % (ref 0.0–3.0)
Eosinophils Absolute: 0.2 10*3/uL (ref 0.0–0.7)
Eosinophils Relative: 3 % (ref 0.0–5.0)
HCT: 39.1 % (ref 36.0–46.0)
Hemoglobin: 12.8 g/dL (ref 12.0–15.0)
Lymphocytes Relative: 33.9 % (ref 12.0–46.0)
Lymphs Abs: 2 10*3/uL (ref 0.7–4.0)
MCHC: 32.7 g/dL (ref 30.0–36.0)
MCV: 89.5 fl (ref 78.0–100.0)
Monocytes Absolute: 0.5 10*3/uL (ref 0.1–1.0)
Monocytes Relative: 9.2 % (ref 3.0–12.0)
Neutro Abs: 3.1 10*3/uL (ref 1.4–7.7)
Neutrophils Relative %: 53.2 % (ref 43.0–77.0)
Platelets: 333 10*3/uL (ref 150.0–400.0)
RBC: 4.37 Mil/uL (ref 3.87–5.11)
RDW: 13.4 % (ref 11.5–15.5)
WBC: 5.8 10*3/uL (ref 4.0–10.5)

## 2023-09-02 LAB — BASIC METABOLIC PANEL
BUN: 16 mg/dL (ref 6–23)
CO2: 31 meq/L (ref 19–32)
Calcium: 9.2 mg/dL (ref 8.4–10.5)
Chloride: 106 meq/L (ref 96–112)
Creatinine, Ser: 0.92 mg/dL (ref 0.40–1.20)
GFR: 57.02 mL/min — ABNORMAL LOW (ref >60.00)
Glucose, Bld: 87 mg/dL (ref 70–99)
Potassium: 4.1 meq/L (ref 3.5–5.1)
Sodium: 145 meq/L (ref 135–145)

## 2023-09-02 LAB — HEMOGLOBIN A1C: Hgb A1c MFr Bld: 6.2 % (ref 4.6–6.5)

## 2023-09-02 LAB — TSH: TSH: 1.1 u[IU]/mL (ref 0.35–5.50)

## 2023-09-02 LAB — VITAMIN B12: Vitamin B-12: >1537 pg/mL — ABNORMAL HIGH (ref 211–911)

## 2023-09-02 MED ORDER — ROSUVASTATIN CALCIUM 10 MG PO TABS: 10.0000 mg | ORAL_TABLET | Freq: Every day | ORAL | 1 refills | Status: DC

## 2023-09-02 MED ORDER — AMOXICILLIN-POT CLAVULANATE 875-125 MG PO TABS
1.0000 | ORAL_TABLET | Freq: Two times a day (BID) | ORAL | 0 refills | Status: DC
Start: 2023-09-02 — End: 2023-09-22

## 2023-09-02 NOTE — Assessment & Plan Note (Signed)
 Reviewed echo and d/w this with patient to inform her of extent of heart disease and she thought heart problems were only related to when she had cortisone injection years ago. Pt verbalized understanding.

## 2023-09-02 NOTE — Progress Notes (Signed)
 Established Patient Office Visit  Subjective:   Patient ID: Julie Duncan, female    DOB: 29-Sep-1940  Age: 83 y.o. MRN: [id number]  CC:  Chief Complaint  Patient presents with   Acute Visit    States that she doesn't "feel good." Pt is not a very good historian. Cardiology started her on BP meds and she doesn't like the way they make her feel. This has been going on for "months" per the pt.    HPI: Julie Duncan is a 83 y.o. female presenting on 09/02/2023 for Acute Visit (States that she doesn't "feel good." Pt is not a very good historian. Cardiology started her on BP meds and she doesn't like the way they make her feel. This has been going on for "months" per the pt.)  Months ago started with fatigue, doesn't sleep well at night not sleeping more than 2-3 hours, sleeps up frequently throughout the night and just overall not feeling well at all. She is not sure if she snores at night, her husband prior to his passing did state that she snores.   Her head feels 'full' all of the time, feels like she should just lay it down and get some relief.  She states her blood pressure is high 'today' but doesn't check it at home.   She does have allergies, used to take shots.   She denies faint, lightheaded or dizzy.   HTN: she is on losartan 50 mg twice daily , she states that she thinks this is the reason she feels the way that she does.  HLD: she is prescribed rosuvastatin 10 mg nightly but is not currently taking.   Afib: had h/o ablation.         ROS: Negative unless specifically indicated above in HPI.   Relevant past medical history reviewed and updated as indicated.   Allergies and medications reviewed and updated.   Current Outpatient Medications:    amoxicillin-clavulanate (AUGMENTIN) 875-125 MG tablet, Take 1 tablet by mouth 2 (two) times daily., Disp: 20 tablet, Rfl: 0   ascorbic acid (VITAMIN C) 500 MG tablet, Take 500 mg by mouth daily., Disp: , Rfl:    aspirin EC 81  MG tablet, Take 1 tablet (81 mg total) by mouth daily. Swallow whole., Disp: 90 tablet, Rfl: 3   Cholecalciferol (VITAMIN D PO), Take 1 tablet by mouth daily., Disp: , Rfl:    Coenzyme Q10 10 MG capsule, Take 10 mg by mouth daily., Disp: , Rfl:    cyanocobalamin 50 MCG tablet, Take 1 tablet by mouth daily., Disp: , Rfl:    Ginkgo Biloba 40 MG TABS, Take 1 tablet by mouth daily., Disp: , Rfl:    lactose free nutrition (BOOST) LIQD, Take 237 mLs by mouth daily with supper., Disp: , Rfl:    losartan (COZAAR) 50 MG tablet, Take 1 tablet (50 mg total) by mouth 2 (two) times daily., Disp: 180 tablet, Rfl: 2   OVER THE COUNTER MEDICATION, Take 2 tablets by mouth daily. Total beets, Disp: , Rfl:    VITAMIN E PO, Take 1 tablet by mouth daily., Disp: , Rfl:    rosuvastatin (CRESTOR) 10 MG tablet, Take 1 tablet (10 mg total) by mouth daily., Disp: 90 tablet, Rfl: 1  Allergies  Allergen Reactions   Cortisone Other (See Comments)    Patient states cortisone injections have "messes with my heart".  Has had cortisone shots and each time causes "heart rhythm problems".   Amlodipine Itching  Red Yeast Rice  [Cholestin] Other (See Comments)   Saccharin Other (See Comments)   Warfarin And Related     anorexia    Objective:   BP (!) 144/86 (BP Location: Left Arm, Patient Position: Sitting, Cuff Size: Large)   Pulse 91   Temp 98.4 F (36.9 C) (Temporal)   Ht 5\' 3"  (1.6 m)   Wt 183 lb (83 kg)   SpO2 97%   BMI 32.42 kg/m    Physical Exam Vitals reviewed.  Constitutional:      General: She is not in acute distress.    Appearance: Normal appearance. She is normal weight. She is not ill-appearing, toxic-appearing or diaphoretic.  HENT:     Head: Normocephalic.     Nose:     Left Turbinates: Swollen.     Right Sinus: Frontal sinus tenderness present.     Left Sinus: Frontal sinus tenderness present.     Mouth/Throat:     Tongue: No lesions.     Pharynx: Postnasal drip present.  Cardiovascular:      Rate and Rhythm: Normal rate and regular rhythm.  Pulmonary:     Effort: Pulmonary effort is normal.     Breath sounds: Normal breath sounds.  Musculoskeletal:        General: Normal range of motion.  Neurological:     General: No focal deficit present.     Mental Status: She is alert and oriented to person, place, and time. Mental status is at baseline.  Psychiatric:        Mood and Affect: Mood normal.        Behavior: Behavior normal.        Thought Content: Thought content normal.        Judgment: Judgment normal.     Assessment & Plan:  Excessive daytime sleepiness Assessment & Plan: Tsh cbc cmp b12 ordered Ddx thyroid disease, anemia, electrolyte concerns, low b12, osa Ordering sleep study as also with poor sleep and snoring  Could consider depressive component, with loneliness  Does have some social interaction with senior outings.    Orders: -     CBC with Differential/Platelet -     Vitamin B12 -     TSH -     Basic metabolic panel -     Ambulatory referral to Sleep Studies  Mixed hyperlipidemia Assessment & Plan: Ordered lipid panel, pending results. Work on low cholesterol diet and exercise as tolerated Refill sent for rosuvastatin  Orders: -     Rosuvastatin Calcium; Take 1 tablet (10 mg total) by mouth daily.  Dispense: 90 tablet; Refill: 1  Essential hypertension Assessment & Plan: Slight elevation today not overly concerning for age Continue losartan as prescribed.  Pt advised of the following:  Continue medication as prescribed. Monitor blood pressure periodically and/or when you feel symptomatic. Goal is <130/90 on average. Ensure that you have rested for 30 minutes prior to checking your blood pressure. Record your readings and bring them to your next visit if necessary.work on a low sodium diet.    Stage 3a chronic kidney disease (HCC) Assessment & Plan: Cmp ordered pending results   Prediabetes Assessment & Plan: Pt advised of the  following: Work on a diabetic diet, try to incorporate exercise at least 20-30 a day for 3 days a week or more.  Ordering a1c  Orders: -     Hemoglobin A1c  Acute non-recurrent frontal sinusitis Assessment & Plan: Prescription given for augmentin 875/125 mg po bid for  ten days. Pt to continue tylenol prn sinus pain. Continue with humidifier prn and steam showers recommended as well. instructed If no symptom improvement in 48 hours please f/u  Recommendation for short term flonase  As well as over the counter allegra    Orders: -     Amoxicillin-Pot Clavulanate; Take 1 tablet by mouth 2 (two) times daily.  Dispense: 20 tablet; Refill: 0  History of atrial flutter  Seasonal allergic rhinitis due to pollen Assessment & Plan: Suspect this is contributing to fatigue and head fullness Start otc allegra and also flonase, short term because of use of asa  Allegra, should be safest bet with cardiac history. If causes increased fatigue stop taking.   LVH (left ventricular hypertrophy) Assessment & Plan: Reviewed echo and d/w this with patient to inform her of extent of heart disease and she thought heart problems were only related to when she had cortisone injection years ago. Pt verbalized understanding.      Follow up plan: Return in about 3 weeks (around 09/23/2023) for f/u with pcp .  Mort Sawyers, FNP

## 2023-09-02 NOTE — Assessment & Plan Note (Signed)
 Prescription given for augmentin 875/125 mg po bid for ten days. Pt to continue tylenol prn sinus pain. Continue with humidifier prn and steam showers recommended as well. instructed If no symptom improvement in 48 hours please f/u  Recommendation for short term flonase  As well as over the counter allegra

## 2023-09-02 NOTE — Assessment & Plan Note (Signed)
 Ordered lipid panel, pending results. Work on low cholesterol diet and exercise as tolerated Refill sent for rosuvastatin

## 2023-09-02 NOTE — Assessment & Plan Note (Signed)
 Tsh cbc cmp b12 ordered Ddx thyroid disease, anemia, electrolyte concerns, low b12, osa Ordering sleep study as also with poor sleep and snoring  Could consider depressive component, with loneliness  Does have some social interaction with senior outings.

## 2023-09-02 NOTE — Assessment & Plan Note (Signed)
 Suspect this is contributing to fatigue and head fullness Start otc allegra and also flonase, short term because of use of asa  Allegra, should be safest bet with cardiac history. If causes increased fatigue stop taking.

## 2023-09-02 NOTE — Assessment & Plan Note (Signed)
Cmp ordered pending results.

## 2023-09-02 NOTE — Patient Instructions (Addendum)
  Referral placed for sleep studies it will come to your home and you will complete and I will give you the results.   Try some over the counter allegra as well as over the counter Flonase for a least a few weeks.

## 2023-09-02 NOTE — Assessment & Plan Note (Signed)
 Slight elevation today not overly concerning for age Continue losartan as prescribed.  Pt advised of the following:  Continue medication as prescribed. Monitor blood pressure periodically and/or when you feel symptomatic. Goal is <130/90 on average. Ensure that you have rested for 30 minutes prior to checking your blood pressure. Record your readings and bring them to your next visit if necessary.work on a low sodium diet.

## 2023-09-02 NOTE — Assessment & Plan Note (Signed)
 Pt advised of the following: Work on a diabetic diet, try to incorporate exercise at least 20-30 a day for 3 days a week or more.  Ordering a1c

## 2023-09-03 NOTE — Progress Notes (Signed)
 B12 a bit high, what is pt currently taking dose wise?  I would scale back, too high of a dose.  Mildly prediabetic, work on diabetic diet exercise as tolerated.  Thyroid normal range.  Kidneys remain stable.

## 2023-09-22 ENCOUNTER — Encounter: Payer: Self-pay | Admitting: Ophthalmology

## 2023-09-23 ENCOUNTER — Ambulatory Visit (INDEPENDENT_AMBULATORY_CARE_PROVIDER_SITE_OTHER): Admitting: Internal Medicine

## 2023-09-23 ENCOUNTER — Encounter: Payer: Self-pay | Admitting: Internal Medicine

## 2023-09-23 VITALS — BP 126/72 | HR 88 | Temp 98.3°F | Ht 63.0 in | Wt 181.0 lb

## 2023-09-23 DIAGNOSIS — I10 Essential (primary) hypertension: Secondary | ICD-10-CM

## 2023-09-23 NOTE — Patient Instructions (Signed)
 Please try just taking losartan 50mg  at bedtime. If your blood pressure goes up, you can increase to 100mg --but take it all together at bedtime (and we could switch to one 100mg  tab). If that doesn't help your sleep and daytime symptoms, you can try melatonin 3 or even 6mg  at bedtime to see if that helps you sleep longer.

## 2023-09-23 NOTE — Progress Notes (Signed)
 Subjective:    Patient ID: Julie Duncan, female    DOB: September 01, 1940, 83 y.o.   MRN: [id number]  HPI Here for follow up of ongoing sleep problems  She feels the losartan is "messing up my sleeping" Will look at clock after awakening and then not get back to sleep Stays sleepy all day long---tries not to sleep Goes to bed at 11PM--usually up at 5AM (used to sleep longer). But did get up at [employer] when she was working Occasionally gets up 3AM--may doze back off and stays in bed  Daytime doesn't feel sleepy---just "yawny" Doesn't fall asleep if doing something Has had some brief naps---like if sitting in the chair  No caffeine drinks after 4PM Drinks tea all day long though--and some coke  Falls asleep easy---doesn't even finish her prayer Doesn't get out of bed in the night Just looks at clock and turns over to try to get back to sleep---but is not able  Always active--but not as much since COVID  Hasn't tried any sleep meds--but has melatonin  Current Outpatient Medications on File Prior to Visit  Medication Sig Dispense Refill   ascorbic acid (VITAMIN C) 500 MG tablet Take 500 mg by mouth daily.     aspirin EC 81 MG tablet Take 1 tablet (81 mg total) by mouth daily. Swallow whole. 90 tablet 3   Cholecalciferol (VITAMIN D PO) Take 1 tablet by mouth daily.     Coenzyme Q10 10 MG capsule Take 10 mg by mouth daily.     cyanocobalamin 50 MCG tablet Take 1 tablet by mouth daily.     Ginkgo Biloba 40 MG TABS Take 1 tablet by mouth daily.     losartan (COZAAR) 50 MG tablet Take 1 tablet (50 mg total) by mouth 2 (two) times daily. 180 tablet 2   rosuvastatin (CRESTOR) 10 MG tablet Take 1 tablet (10 mg total) by mouth daily. 90 tablet 1   VITAMIN E PO Take 1 tablet by mouth daily.     No current facility-administered medications on file prior to visit.    Allergies  Allergen Reactions   Cortisone Other (See Comments)    Patient states cortisone injections have "messes with my heart".   Has had cortisone shots and each time causes "heart rhythm problems".   Amlodipine Itching   Calcium-Containing Compounds     Shoulder pain   Red Yeast Rice  [Cholestin] Other (See Comments)   Saccharin Other (See Comments)    "Kidney infections"   Warfarin And Related     anorexia    Past Medical History:  Diagnosis Date   Arthritis    hands and ankles   Atrial flutter (HCC)    Atrial myxoma    a. 2016 s/p resection ([facility]); b. 04/2017 Echo: EF 60-65%, no rwma, mild AI/MR. Mild-mod TR. Nl PASP; c. 07/2021 Echo: EF 60-65%, no rwma, nl RV fxn, mild MR/AI. Ao sclerosis.   Coronary artery disease 03/27/2022   50% mid LAD stenosis (not significant by iFR)   History of stress test    a. 04/2017 ETT: Poor ex capacity. No ischemia. Episode of PSVT noted.   Hyperlipidemia    Hypertension    Mobitz I    Mobitz II    Paroxysmal atrial fibrillation (HCC)    a. CHA2DS2VASc = 4.  Did not tolerate Warfarin. Difficulty in affording DOAC. Eliquis added 07/2021 (new Aflutter).   Paroxysmal SVT (supraventricular tachycardia) (HCC)    a. 06/2020 Zio: 3813 SVT runs -  longest 20m 47s, max rate 231; b. 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   Wears dentures    partial upper and lower.  no longer has lower    Past Surgical History:  Procedure Laterality Date   ANKLE SURGERY Left    ATRIAL FIBRILLATION ABLATION N/A 06/05/2022   Procedure: ATRIAL FIBRILLATION ABLATION;  Surgeon: Lanier Prude, MD;  Location: [facility];  Service: Cardiovascular;  Laterality: N/A;   CARDIOVERSION N/A 08/26/2021   Procedure: CARDIOVERSION;  Surgeon: Yvonne Kendall, MD;  Location: [facility];  Service: Cardiovascular;  Laterality: N/A;   CORONARY PRESSURE/FFR STUDY N/A 03/27/2022   Procedure: INTRAVASCULAR PRESSURE WIRE/FFR STUDY;  Surgeon: Yvonne Kendall, MD;  Location: [facility];  Service: Cardiovascular;  Laterality: N/A;   EXCISION OF ATRIAL MYXOMA     LEFT HEART CATH AND CORONARY ANGIOGRAPHY N/A  03/27/2022   Procedure: LEFT HEART CATH AND CORONARY ANGIOGRAPHY;  Surgeon: Yvonne Kendall, MD;  Location: [facility];  Service: Cardiovascular;  Laterality: N/A;   PARTIAL HYSTERECTOMY     SVT ABLATION N/A 07/30/2020   Procedure: SVT ABLATION;  Surgeon: Marinus Maw, MD;  Location: [facility];  Service: Cardiovascular;  Laterality: N/A;   WRIST SURGERY Right     Family History  Problem Relation Age of Onset   Stroke Mother 55   Heart attack Father 36   Cancer Father        throat   Cancer Sister 42       lung   Cancer Brother        lung   Heart attack Brother 54    Social History   Socioeconomic History   Marital status: Widowed    Spouse name: Not on file   Number of children: 2   Years of education: Not on file   Highest education level: Not on file  Occupational History   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 Has a cat.   Daughter in [place]. Son in [place]      Has living will   Daughter is health care POA   Would accept resuscitation   Not sure about tube feeds   Social Drivers of Health   Financial Resource Strain: Not on file  Food Insecurity: Not on file  Transportation Needs: Not on file  Physical Activity: Not on file  Stress: Not on file  Social Connections: Not on file  Intimate Partner Violence: Not on file   Review of Systems No mood issues or stressors Feels calcium messes up her shoulders Ongoing ankle problems    Objective:   Physical Exam Constitutional:      Appearance: Normal appearance.  Neurological:     Mental Status: She is alert.  Psychiatric:        Mood and Affect: Mood normal.         Behavior: Behavior normal.            Assessment & Plan:

## 2023-09-23 NOTE — Assessment & Plan Note (Signed)
 BP Readings from Last 3 Encounters:  09/23/23 126/72  09/02/23 (!) 144/86  10/29/22 124/62   She is concerned that the losartan is causing sedation (unusually but possible) Not sure why she is taking 50 bid at all Discussed trying 50mg  at bedtime only-----to see how that affects daytime sleepiness If BP goes up, can increase to 100mg  at bedtime If not helpful for the sleepiness, could consider melatonin 3-6 mg at bedtime

## 2023-09-29 ENCOUNTER — Encounter: Payer: Self-pay | Admitting: Ophthalmology

## 2023-09-29 NOTE — Anesthesia Preprocedure Evaluation (Addendum)
 Anesthesia Evaluation  Patient identified by MRN, date of birth, ID band Patient awake    Reviewed: Allergy & Precautions, H&P , NPO status , Patient's Chart, lab work & pertinent test results  Airway Mallampati: III  TM Distance: >3 FB Neck ROM: Full    Dental no notable dental hx. (+) Chipped, Partial Upper Has right side upper partial bridge to be removed. Chipped right upper central incisor:   Pulmonary neg pulmonary ROS   Pulmonary exam normal breath sounds clear to auscultation       Cardiovascular hypertension, + CAD  Normal cardiovascular exam+ dysrhythmias  Rhythm:Regular Rate:Normal  06-25-22 echo 1. Left ventricular ejection fraction, by estimation, is 50 to 55%. The  left ventricle has low normal function. Left ventricular endocardial  border not optimally defined to evaluate regional wall motion. There is  mild left ventricular hypertrophy.   2. Right ventricular systolic function is normal. The right ventricular  size is normal.   3. The mitral valve is degenerative. Mild mitral valve regurgitation.  There is mild prolapse of of the mitral valve.   4. Aortic valve regurgitation is mild.   5. The inferior vena cava is normal in size with greater than 50%  respiratory variability, suggesting right atrial pressure of 3 mmHg.     Neuro/Psych negative neurological ROS  negative psych ROS   GI/Hepatic negative GI ROS, Neg liver ROS,GERD  ,,  Endo/Other  negative endocrine ROS    Renal/GU Renal diseasenegative Renal ROS  negative genitourinary   Musculoskeletal negative musculoskeletal ROS (+) Arthritis ,    Abdominal   Peds negative pediatric ROS (+)  Hematology negative hematology ROS (+)   Anesthesia Other Findings Hyperlipidemia  Paroxysmal atrial fibrillation (HCC) Atrial myxoma Hypertension Paroxysmal SVT (supraventricular tachycardia) (HCC)  History of stress test Mobitz I  Mobitz II Atrial  flutter (HCC)  Coronary artery disease Wears dentures  Arthritis Interstitial lung disease (HCC)     Reproductive/Obstetrics negative OB ROS                             Anesthesia Physical Anesthesia Plan  ASA: 3  Anesthesia Plan: MAC   Post-op Pain Management:    Induction: Intravenous  PONV Risk Score and Plan:   Airway Management Planned: Natural Airway and Nasal Cannula  Additional Equipment:   Intra-op Plan:   Post-operative Plan:   Informed Consent: I have reviewed the patients History and Physical, chart, labs and discussed the procedure including the risks, benefits and alternatives for the proposed anesthesia with the patient or authorized representative who has indicated his/her understanding and acceptance.     Dental Advisory Given  Plan Discussed with: Anesthesiologist, CRNA and Surgeon  Anesthesia Plan Comments: (Patient consented for risks of anesthesia including but not limited to:  - adverse reactions to medications - damage to eyes, teeth, lips or other oral mucosa - nerve damage due to positioning  - sore throat or hoarseness - Damage to heart, brain, nerves, lungs, other parts of body or loss of life  Patient voiced understanding and assent.)        Anesthesia Quick Evaluation

## 2023-09-29 NOTE — Discharge Instructions (Signed)

## 2023-09-30 ENCOUNTER — Other Ambulatory Visit: Payer: Self-pay

## 2023-09-30 ENCOUNTER — Encounter: Payer: Self-pay | Admitting: Ophthalmology

## 2023-09-30 ENCOUNTER — Ambulatory Visit: Payer: Self-pay | Admitting: Anesthesiology

## 2023-09-30 ENCOUNTER — Encounter
Admission: RE | Disposition: A | Discharge status: Home / Self Care | Payer: Self-pay | Source: Home / Self Care | Attending: Ophthalmology

## 2023-09-30 ENCOUNTER — Ambulatory Visit
Admission: RE | Admit: 2023-09-30 | Discharge: 2023-09-30 | Disposition: A | Discharge status: Home / Self Care | Attending: Ophthalmology | Admitting: Ophthalmology

## 2023-09-30 DIAGNOSIS — H2512 Age-related nuclear cataract, left eye: Secondary | ICD-10-CM | POA: Insufficient documentation

## 2023-09-30 DIAGNOSIS — I10 Essential (primary) hypertension: Secondary | ICD-10-CM | POA: Insufficient documentation

## 2023-09-30 DIAGNOSIS — M1990 Unspecified osteoarthritis, unspecified site: Secondary | ICD-10-CM | POA: Insufficient documentation

## 2023-09-30 DIAGNOSIS — K219 Gastro-esophageal reflux disease without esophagitis: Secondary | ICD-10-CM | POA: Insufficient documentation

## 2023-09-30 DIAGNOSIS — I2510 Atherosclerotic heart disease of native coronary artery without angina pectoris: Secondary | ICD-10-CM | POA: Diagnosis not present

## 2023-09-30 DIAGNOSIS — Z7722 Contact with and (suspected) exposure to environmental tobacco smoke (acute) (chronic): Secondary | ICD-10-CM | POA: Insufficient documentation

## 2023-09-30 HISTORY — DX: Other hypersomnia: G47.19

## 2023-09-30 HISTORY — DX: Unspecified osteoarthritis, unspecified site: M19.90

## 2023-09-30 HISTORY — DX: Nonrheumatic aortic (valve) insufficiency: I35.1

## 2023-09-30 HISTORY — DX: Presence of dental prosthetic device (complete) (partial): Z97.2

## 2023-09-30 HISTORY — DX: Interstitial pulmonary disease, unspecified: J84.9

## 2023-09-30 SURGERY — PHACOEMULSIFICATION, CATARACT, WITH IOL INSERTION
Anesthesia: Monitor Anesthesia Care | Site: Eye | Laterality: Left

## 2023-09-30 MED ORDER — SIGHTPATH DOSE#1 BSS IO SOLN: INTRAOCULAR | Status: DC | PRN

## 2023-09-30 MED ORDER — MIDAZOLAM HCL 2 MG/2ML IJ SOLN
INTRAMUSCULAR | Status: AC
  Filled 2023-09-30: qty 2

## 2023-09-30 MED ORDER — SIGHTPATH DOSE#1 BSS IO SOLN
INTRAOCULAR | Status: DC | PRN
  Administered 2023-09-30: 15 mL via INTRAOCULAR

## 2023-09-30 MED ORDER — ARMC OPHTHALMIC DILATING DROPS
OPHTHALMIC | Status: AC
  Filled 2023-09-30: qty 0.5

## 2023-09-30 MED ORDER — SIGHTPATH DOSE#1 NA HYALUR & NA CHOND-NA HYALUR IO KIT
PACK | INTRAOCULAR | Status: DC | PRN
  Administered 2023-09-30: 1 via OPHTHALMIC

## 2023-09-30 MED ORDER — TETRACAINE HCL 0.5 % OP SOLN
1.0000 [drp] | OPHTHALMIC | Status: DC | PRN
  Administered 2023-09-30 (×3): 1 [drp] via OPHTHALMIC

## 2023-09-30 MED ORDER — MIDAZOLAM HCL 5 MG/5ML IJ SOLN
INTRAMUSCULAR | Status: DC | PRN
  Administered 2023-09-30: 1 mg via INTRAVENOUS

## 2023-09-30 MED ORDER — BRIMONIDINE TARTRATE-TIMOLOL 0.2-0.5 % OP SOLN
OPHTHALMIC | Status: DC | PRN
  Administered 2023-09-30: 1 [drp] via OPHTHALMIC

## 2023-09-30 MED ORDER — TETRACAINE HCL 0.5 % OP SOLN
OPHTHALMIC | Status: AC
  Filled 2023-09-30: qty 4

## 2023-09-30 MED ORDER — LIDOCAINE HCL (PF) 2 % IJ SOLN
INTRAOCULAR | Status: DC | PRN
  Administered 2023-09-30: 4 mL via INTRAOCULAR

## 2023-09-30 MED ORDER — MOXIFLOXACIN HCL 0.5 % OP SOLN
OPHTHALMIC | Status: DC | PRN
  Administered 2023-09-30: .2 mL via OPHTHALMIC

## 2023-09-30 MED ORDER — FENTANYL CITRATE (PF) 100 MCG/2ML IJ SOLN
INTRAMUSCULAR | Status: AC
  Filled 2023-09-30: qty 2

## 2023-09-30 MED ORDER — ARMC OPHTHALMIC DILATING DROPS
1.0000 | OPHTHALMIC | Status: DC | PRN
  Administered 2023-09-30 (×3): 1 via OPHTHALMIC

## 2023-09-30 MED ORDER — FENTANYL CITRATE (PF) 100 MCG/2ML IJ SOLN
INTRAMUSCULAR | Status: DC | PRN
  Administered 2023-09-30: 50 ug via INTRAVENOUS

## 2023-09-30 SURGICAL SUPPLY — 13 items
CATARACT SUITE SIGHTPATH (MISCELLANEOUS) ×1 IMPLANT
DISSECTOR HYDRO NUCLEUS 50X22 (MISCELLANEOUS) ×1 IMPLANT
DRSG TEGADERM 2-3/8X2-3/4 SM (GAUZE/BANDAGES/DRESSINGS) ×1 IMPLANT
FEE CATARACT SUITE SIGHTPATH (MISCELLANEOUS) ×1 IMPLANT
GLOVE BIOGEL PI IND STRL 8 (GLOVE) ×1 IMPLANT
GLOVE SURG LX STRL 7.5 STRW (GLOVE) ×1 IMPLANT
GLOVE SURG PROTEXIS BL SZ6.5 (GLOVE) ×1 IMPLANT
GLOVE SURG SYN 6.5 PF PI BL (GLOVE) ×1 IMPLANT
LENS IOL DIOP 19.0 (Intraocular Lens) ×1 IMPLANT
LENS IOL TECNIS MONO 19.0 (Intraocular Lens) IMPLANT
NDL FILTER BLUNT 18X1 1/2 (NEEDLE) ×1 IMPLANT
NEEDLE FILTER BLUNT 18X1 1/2 (NEEDLE) ×1 IMPLANT
SYR 3ML LL SCALE MARK (SYRINGE) ×1 IMPLANT

## 2023-09-30 NOTE — Anesthesia Postprocedure Evaluation (Signed)
 Anesthesia Post Note  Patient: Julie Duncan  Procedure(s) Performed: PHACOEMULSIFICATION, CATARACT, WITH IOL INSERTION 13.77 01:16.2 (Left: Eye)  Patient location during evaluation: PACU Anesthesia Type: MAC Level of consciousness: awake and alert Pain management: pain level controlled Vital Signs Assessment: post-procedure vital signs reviewed and stable Respiratory status: spontaneous breathing, nonlabored ventilation, respiratory function stable and patient connected to nasal cannula oxygen Cardiovascular status: stable and blood pressure returned to baseline Postop Assessment: no apparent nausea or vomiting Anesthetic complications: no   No notable events documented.   Last Vitals:  Vitals:   09/30/23 0853 09/30/23 0859  BP: (!) 146/80 (!) 149/78  Pulse: 85 86  Resp: 14 17  Temp: (!) 36.2 C (!) 36.2 C  SpO2: 98% 98%    Last Pain:  Vitals:   09/30/23 0859  TempSrc:   PainSc: 0-No pain                 Nike Southwell C Desta Bujak

## 2023-09-30 NOTE — Op Note (Signed)
 OPERATIVE NOTE  AMERIKA NOURSE [id number] 09/30/2023   PREOPERATIVE DIAGNOSIS: Nuclear sclerotic cataract left eye. H25.12   POSTOPERATIVE DIAGNOSIS: Nuclear sclerotic cataract left eye. H25.12   PROCEDURE:  Phacoemusification with posterior chamber intraocular lens placement of the left eye  Ultrasound time: Procedure(s): PHACOEMULSIFICATION, CATARACT, WITH IOL INSERTION 13.77 01:16.2 (Left)  LENS:   Implant Name Type Inv. Item Serial No. Manufacturer Lot No. LRB No. Used Action  LENS IOL DIOP 19.0 - [id number] Intraocular Lens LENS IOL DIOP 19.0 [id number] [employer]  Left 1 Implanted      SURGEON:  Julious Payer. Rolley Sims, MD   ANESTHESIA:  Topical with tetracaine drops, augmented with 1% preservative-free intracameral lidocaine.   COMPLICATIONS:  None.   DESCRIPTION OF PROCEDURE:  The patient was identified in the holding room and transported to the operating room and placed in the supine position under the operating microscope.  The left eye was identified as the operative eye, which was prepped and draped in the usual sterile ophthalmic fashion.   A 1 millimeter clear-corneal paracentesis was made inferotemporally. Preservative-free 1% lidocaine mixed with 1:1,000 bisulfite-free aqueous solution of epinephrine was injected into the anterior chamber. The anterior chamber was then filled with Viscoat viscoelastic. A 2.4 millimeter keratome was used to make a clear-corneal incision superotemporally. A curvilinear capsulorrhexis was made with a cystotome and capsulorrhexis forceps. Balanced salt solution was used to hydrodissect and hydrodelineate the nucleus. Phacoemulsification was then used to remove the lens nucleus and epinucleus. The remaining cortex was then removed using the irrigation and aspiration handpiece. Provisc was then placed into the capsular bag to distend it for lens placement. A +19.00 D DCB00 intraocular lens was then injected into the capsular bag. The remaining viscoelastic  was aspirated.   Wounds were hydrated with balanced salt solution.  The anterior chamber was inflated to a physiologic pressure with balanced salt solution.  No wound leaks were noted. Moxifloxacin was injected intracamerally.  Timolol and Brimonidine drops were applied to the eye.  The patient was taken to the recovery room in stable condition without complications of anesthesia or surgery.  Rolly Pancake [place] 09/30/2023, 8:50 AM

## 2023-09-30 NOTE — Transfer of Care (Signed)
 Immediate Anesthesia Transfer of Care Note  Patient: Julie Duncan  Procedure(s) Performed: PHACOEMULSIFICATION, CATARACT, WITH IOL INSERTION 13.77 01:16.2 (Left: Eye)  Patient Location: PACU  Anesthesia Type: MAC  Level of Consciousness: awake, alert  and patient cooperative  Airway and Oxygen Therapy: Patient Spontanous Breathing and Patient connected to supplemental oxygen  Post-op Assessment: Post-op Vital signs reviewed, Patient's Cardiovascular Status Stable, Respiratory Function Stable, Patent Airway and No signs of Nausea or vomiting  Post-op Vital Signs: Reviewed and stable  Complications: No notable events documented.

## 2023-09-30 NOTE — H&P (Signed)
 [facility]   Primary Care Physician:  Karie Schwalbe, MD Ophthalmologist: Dr. Deberah Pelton  Pre-Procedure History & Physical: HPI:  Julie Duncan is a 83 y.o. female here for cataract surgery.   Past Medical History:  Diagnosis Date   Arthritis    hands and ankles   Atrial flutter (HCC)    Atrial myxoma    a. 2016 s/p resection ([facility]); b. 04/2017 Echo: EF 60-65%, no rwma, mild AI/MR. Mild-mod TR. Nl PASP; c. 07/2021 Echo: EF 60-65%, no rwma, nl RV fxn, mild MR/AI. Ao sclerosis.   Coronary artery disease 03/27/2022   50% mid LAD stenosis (not significant by iFR)   Excessive daytime sleepiness    History of stress test    a. 04/2017 ETT: Poor ex capacity. No ischemia. Episode of PSVT noted.   Hyperlipidemia    Hypertension    Interstitial lung disease (HCC)    Mild aortic regurgitation    Mobitz I    Mobitz II    Paroxysmal atrial fibrillation (HCC)    a. CHA2DS2VASc = 4.  Did not tolerate Warfarin. Difficulty in affording DOAC. Eliquis added 07/2021 (new Aflutter).   Paroxysmal SVT (supraventricular tachycardia) (HCC)    a. 06/2020 Zio: 3813 SVT runs - longest 52m 47s, max rate 231; b. 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   Wears dentures    partial upper and lower.  no longer has lower    Past Surgical History:  Procedure Laterality Date   ANKLE SURGERY Left    ATRIAL FIBRILLATION ABLATION N/A 06/05/2022   Procedure: ATRIAL FIBRILLATION ABLATION;  Surgeon: Lanier Prude, MD;  Location: [facility];  Service: Cardiovascular;  Laterality: N/A;   CARDIOVERSION N/A 08/26/2021   Procedure: CARDIOVERSION;  Surgeon: Yvonne Kendall, MD;  Location: [facility];  Service: Cardiovascular;  Laterality: N/A;   CORONARY PRESSURE/FFR STUDY N/A 03/27/2022   Procedure: INTRAVASCULAR PRESSURE WIRE/FFR STUDY;  Surgeon: Yvonne Kendall, MD;  Location: [facility];  Service: Cardiovascular;  Laterality: N/A;   EXCISION OF ATRIAL MYXOMA     LEFT HEART CATH AND CORONARY  ANGIOGRAPHY N/A 03/27/2022   Procedure: LEFT HEART CATH AND CORONARY ANGIOGRAPHY;  Surgeon: Yvonne Kendall, MD;  Location: [facility];  Service: Cardiovascular;  Laterality: N/A;   PARTIAL HYSTERECTOMY     SVT ABLATION N/A 07/30/2020   Procedure: SVT ABLATION;  Surgeon: Marinus Maw, MD;  Location: [facility];  Service: Cardiovascular;  Laterality: N/A;   WRIST SURGERY Right     Prior to Admission medications   Medication Sig Start Date End Date Taking? Authorizing Provider  ascorbic acid (VITAMIN C) 500 MG tablet Take 500 mg by mouth daily.   Yes [provider]  aspirin EC 81 MG tablet Take 1 tablet (81 mg total) by mouth daily. Swallow whole. 09/23/22  Yes Lanier Prude, MD  Cholecalciferol (VITAMIN D PO) Take 1 tablet by mouth daily.   Yes [provider]  Coenzyme Q10 10 MG capsule Take 10 mg by mouth daily.   Yes [provider]  cyanocobalamin 50 MCG tablet Take 1 tablet by mouth daily.   Yes [provider]  losartan (COZAAR) 50 MG tablet Take 1 tablet (50 mg total) by mouth 2 (two) times daily. 01/19/23  Yes End, Cristal Deer, MD  rosuvastatin (CRESTOR) 10 MG tablet Take 1 tablet (10 mg total) by mouth daily. 09/02/23  Yes Dugal, Wyatt Mage, FNP  VITAMIN E PO Take 1 tablet  by mouth daily.   Yes [provider]  Ginkgo Biloba 40 MG TABS Take 1 tablet by mouth daily.    [provider]    Allergies as of 09/10/2023 - Review Complete 09/03/2023  Allergen Reaction Noted   Cortisone Other (See Comments) 08/26/2021   Amlodipine Itching 01/08/2022   Red yeast rice  [cholestin] Other (See Comments) 07/27/2012   Saccharin Other (See Comments) 07/27/2012   Warfarin and related  07/29/2021    Family History  Problem Relation Age of Onset   Stroke Mother 35   Heart attack Father 51   Cancer Father        throat   Cancer Sister 13       lung   Cancer Brother        lung   Heart attack Brother 60    Social  History   Socioeconomic History   Marital status: Widowed    Spouse name: Not on file   Number of children: 2   Years of education: Not on file   Highest education level: Not on file  Occupational History   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 Has a cat.   Daughter in [place]. Son in [place]      Has living will   Daughter is health care POA   Would accept resuscitation   Not sure about tube feeds   Social Drivers of Health   Financial Resource Strain: Not on file  Food Insecurity: Not on file  Transportation Needs: Not on file  Physical Activity: Not on file  Stress: Not on file  Social Connections: Not on file  Intimate Partner Violence: Not on file    Review of Systems: See HPI, otherwise negative ROS  Physical Exam: Ht 5\' 7"  (1.702 m)   Wt 83 kg   BMI 28.66 kg/m  General:   Alert, cooperative in NAD Head:  Normocephalic and atraumatic. Respiratory:  Normal work of breathing. Cardiovascular:  RRR  Impression/Plan: Julie Duncan is here for cataract surgery.  Risks, benefits, limitations, and alternatives regarding cataract surgery have been reviewed with the patient.  Questions have been answered.  All parties agreeable.   Estanislado Pandy, MD  09/30/2023, 7:10 AM

## 2023-10-01 ENCOUNTER — Ambulatory Visit

## 2023-10-01 ENCOUNTER — Encounter: Payer: Self-pay | Admitting: Ophthalmology

## 2023-10-04 NOTE — Anesthesia Preprocedure Evaluation (Addendum)
 Anesthesia Evaluation  Patient identified by MRN, date of birth, ID band Patient awake    Reviewed: Allergy & Precautions, H&P , NPO status , Patient's Chart, lab work & pertinent test results  Airway Mallampati: III  TM Distance: >3 FB     Dental  (+) Chipped, Partial Upper  Chipped, Partial Upper Has right side upper partial bridge to be removed. Chipped right upper central incisor:  :   Pulmonary neg pulmonary ROS          Cardiovascular hypertension, + CAD  negative cardio ROS + dysrhythmias   06-25-22 echo 1. Left ventricular ejection fraction, by estimation, is 50 to 55%. The  left ventricle has low normal function. Left ventricular endocardial  border not optimally defined to evaluate regional wall motion. There is  mild left ventricular hypertrophy.   2. Right ventricular systolic function is normal. The right ventricular  size is normal.   3. The mitral valve is degenerative. Mild mitral valve regurgitation.  There is mild prolapse of of the mitral valve.   4. Aortic valve regurgitation is mild.   5. The inferior vena cava is normal in size with greater than 50%  respiratory variability, suggesting right atrial pressure of 3 mmHg.     Neuro/Psych negative neurological ROS  negative psych ROS   GI/Hepatic negative GI ROS, Neg liver ROS,GERD  ,,  Endo/Other  negative endocrine ROS    Renal/GU Renal diseasenegative Renal ROS  negative genitourinary   Musculoskeletal negative musculoskeletal ROS (+) Arthritis ,    Abdominal   Peds negative pediatric ROS (+)  Hematology negative hematology ROS (+)   Anesthesia Other Findings  Previous cataract surgery 09-30-23 Dr. Aldo Amble   Hyperlipidemia  Paroxysmal atrial fibrillation [facility]) Atrial myxoma Hypertension Paroxysmal  SVT (supraventricular tachycardia) (HCC)  History of stress test Mobitz I  Mobitz II Atrial flutter (HCC)  Coronary artery  disease Wears dentures  Arthritis Interstitial lung disease (HCC)  Mild aortic regurgitation Excessive daytime sleepiness      Reproductive/Obstetrics negative OB ROS                             Anesthesia Physical Anesthesia Plan  ASA: 3  Anesthesia Plan: MAC   Post-op Pain Management:    Induction: Intravenous  PONV Risk Score and Plan:   Airway Management Planned: Natural Airway and Nasal Cannula  Additional Equipment:   Intra-op Plan:   Post-operative Plan:   Informed Consent: I have reviewed the patients History and Physical, chart, labs and discussed the procedure including the risks, benefits and alternatives for the proposed anesthesia with the patient or authorized representative who has indicated his/her understanding and acceptance.     Dental Advisory Given  Plan Discussed with: Anesthesiologist, CRNA and Surgeon  Anesthesia Plan Comments: (Patient consented for risks of anesthesia including but not limited to:  - adverse reactions to medications - damage to eyes, teeth, lips or other oral mucosa - nerve damage due to positioning  - sore throat or hoarseness - Damage to heart, brain, nerves, lungs, other parts of body or loss of life  Patient voiced understanding and assent.)        Anesthesia Quick Evaluation

## 2023-10-12 ENCOUNTER — Ambulatory Visit (INDEPENDENT_AMBULATORY_CARE_PROVIDER_SITE_OTHER)

## 2023-10-12 VITALS — BP 149/78 | Ht 63.0 in | Wt 180.0 lb

## 2023-10-12 DIAGNOSIS — Z2821 Immunization not carried out because of patient refusal: Secondary | ICD-10-CM | POA: Diagnosis not present

## 2023-10-12 DIAGNOSIS — Z0000 Encounter for general adult medical examination without abnormal findings: Secondary | ICD-10-CM

## 2023-10-12 NOTE — Discharge Instructions (Signed)

## 2023-10-12 NOTE — Progress Notes (Signed)
 Because this visit was a virtual/telehealth visit,  certain criteria was not obtained, such a blood pressure, CBG if applicable, and timed get up and go. Any medications not marked as "taking" were not mentioned during the medication reconciliation part of the visit. Any vitals not documented were not able to be obtained due to this being a telehealth visit or patient was unable to self-report a recent blood pressure reading due to a lack of equipment at home via telehealth. Vitals that have been documented are verbally provided by the patient.    Subjective:   Julie Duncan is a 83 y.o. who presents for a Medicare Wellness preventive visit.  Visit Complete: Virtual I connected with  Julie Duncan on 10/12/23 by a audio enabled telemedicine application and verified that I am speaking with the correct person using two identifiers.  Patient Location: Home  Provider Location: Home Office  I discussed the limitations of evaluation and management by telemedicine. The patient expressed understanding and agreed to proceed.  Vital Signs: Because this visit was a virtual/telehealth visit, some criteria may be missing or patient reported. Any vitals not documented were not able to be obtained and vitals that have been documented are patient reported.  VideoDeclined- This patient declined Librarian, academic. Therefore the visit was completed with audio only.  Persons Participating in Visit: Patient.  AWV Questionnaire: No: Patient Medicare AWV questionnaire was not completed prior to this visit.  Cardiac Risk Factors include: advanced age (>61men, >8 women);hypertension;dyslipidemia;obesity (BMI >30kg/m2)     Objective:    Today's Vitals   10/12/23 1017  BP: (!) 149/78  Weight: 180 lb (81.6 kg)  Height: 5\' 3"  (1.6 m)   Body mass index is 31.89 kg/m.     10/12/2023   10:15 AM 09/30/2023    7:20 AM 06/05/2022    5:43 AM 04/15/2022   10:33 AM 03/27/2022   12:15  PM 08/26/2021    7:02 AM 07/30/2020    5:46 AM  Advanced Directives  Does Patient Have a Medical Advance Directive? Yes Yes Yes Yes Yes Yes Yes  Type of Sales promotion account executive of Attorney Living will Healthcare Power of [place];Living will Healthcare Power of [place];Living will Living will;Healthcare Power of [employer] Power of Attorney  Does patient want to make changes to medical advance directive? No - Patient declined No - Patient declined No - Patient declined      Copy of Healthcare Power of Attorney in Chart? No - copy requested No - copy requested  No - copy requested  No - copy requested     Current Medications (verified) Outpatient Encounter Medications as of 10/12/2023  Medication Sig   ascorbic acid (VITAMIN C) 500 MG tablet Take 500 mg by mouth daily.   aspirin  EC 81 MG tablet Take 1 tablet (81 mg total) by mouth daily. Swallow whole.   Cholecalciferol (VITAMIN D PO) Take 1 tablet by mouth daily.   Coenzyme Q10 10 MG capsule Take 10 mg by mouth daily.   cyanocobalamin  50 MCG tablet Take 1 tablet by mouth daily.   Ginkgo Biloba 40 MG TABS Take 1 tablet by mouth daily.   losartan  (COZAAR ) 50 MG tablet Take 1 tablet (50 mg total) by mouth 2 (two) times daily.   rosuvastatin  (CRESTOR ) 10 MG tablet Take 1 tablet (10 mg total) by mouth daily.   VITAMIN E PO Take 1 tablet by mouth daily.   No facility-administered encounter medications  on file as of 10/12/2023.    Allergies (verified) Cortisone, Amlodipine , Calcium -containing compounds, Red yeast rice  [cholestin], Saccharin, and Warfarin and related   History: Past Medical History:  Diagnosis Date   Arthritis    hands and ankles   Atrial flutter (HCC)    Atrial myxoma    a. 2016 s/p resection ([facility]); b. 04/2017 Echo: EF 60-65%, no rwma, mild AI/MR. Mild-mod TR. Nl PASP; c. 07/2021 Echo: EF 60-65%, no rwma, nl RV fxn, mild MR/AI. Ao sclerosis.   Coronary artery disease  03/27/2022   50% mid LAD stenosis (not significant by iFR)   Excessive daytime sleepiness    History of stress test    a. 04/2017 ETT: Poor ex capacity. No ischemia. Episode of PSVT noted.   Hyperlipidemia    Hypertension    Interstitial lung disease (HCC)    Mild aortic regurgitation    Mobitz I    Mobitz II    Paroxysmal atrial fibrillation (HCC)    a. CHA2DS2VASc = 4.  Did not tolerate Warfarin. Difficulty in affording DOAC. Eliquis  added 07/2021 (new Aflutter).   Paroxysmal SVT (supraventricular tachycardia) (HCC)    a. 06/2020 [facility]io: 3813 SVT runs - longest 22m 47s, max rate 231; b. 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   Wears dentures    partial upper and lower.  no longer has lower   Past Surgical History:  Procedure Laterality Date   ANKLE SURGERY Left    ATRIAL FIBRILLATION ABLATION N/A 06/05/2022   Procedure: ATRIAL FIBRILLATION ABLATION;  Surgeon: Boyce Byes, MD;  Location: [facility];  Service: Cardiovascular;  Laterality: N/A;   CARDIOVERSION N/A 08/26/2021   Procedure: CARDIOVERSION;  Surgeon: Sammy Crisp, MD;  Location: [facility] ORS;  Service: Cardiovascular;  Laterality: N/A;   CATARACT EXTRACTION W/PHACO Left 09/30/2023   Procedure: PHACOEMULSIFICATION, CATARACT, WITH IOL INSERTION 13.77 01:16.2;  Surgeon: Trudi Fus, MD;  Location: [facility];  Service: Ophthalmology;  Laterality: Left;   CORONARY PRESSURE/FFR STUDY N/A 03/27/2022   Procedure: INTRAVASCULAR PRESSURE WIRE/FFR STUDY;  Surgeon: Sammy Crisp, MD;  Location: [facility] INVASIVE CV LAB;  Service: Cardiovascular;  Laterality: N/A;   EXCISION OF ATRIAL MYXOMA     LEFT HEART CATH AND CORONARY ANGIOGRAPHY N/A 03/27/2022   Procedure: LEFT HEART CATH AND CORONARY ANGIOGRAPHY;  Surgeon: Sammy Crisp, MD;  Location: [facility] INVASIVE CV LAB;  Service: Cardiovascular;  Laterality: N/A;   PARTIAL HYSTERECTOMY     SVT ABLATION N/A 07/30/2020   Procedure: SVT ABLATION;  Surgeon: Tammie Fall, MD;  Location: [facility];  Service: Cardiovascular;  Laterality: N/A;   WRIST SURGERY Right    Family History  Problem Relation Age of Onset   Stroke Mother 10   Heart attack Father 50   Cancer Father        throat   Cancer Sister 19       lung   Cancer Brother        lung   Heart attack Brother 31   Social History   Socioeconomic History   Marital status: Widowed    Spouse name: Not on file   Number of children: 2   Years of education: Not on file   Highest education level: Not on file  Occupational History   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 Has a cat.   Daughter in [place]. Son in [place]      Has living will   Daughter is health care POA   Would accept resuscitation   Not sure about tube feeds   Social Drivers of Health   Financial Resource Strain: Low Risk  (10/12/2023)   Overall Financial Resource Strain (CARDIA)    Difficulty of Paying Living Expenses: Not hard at all  Food Insecurity: No Food Insecurity (10/12/2023)   Hunger Vital Sign    Worried About Running Out of Food in the Last Year: Never true    Ran Out of Food in the Last Year: Never true  Transportation Needs: No Transportation Needs (10/12/2023)   PRAPARE - Administrator, Civil Service (Medical): No    Lack of Transportation (Non-Medical): No  Physical Activity: Insufficiently Active (10/12/2023)   Exercise Vital Sign    Days of Exercise per Week: 3 days    Minutes of Exercise per Session: 20 min  Stress: No Stress Concern Present (10/12/2023)   [employer] of Occupational Health - Occupational Stress Questionnaire    Feeling of Stress :  Not at all  Social Connections: Socially Integrated (10/12/2023)   Social Connection and Isolation Panel [NHANES]    Frequency of Communication with Friends and Family: More than three times a week    Frequency of Social Gatherings with Friends and Family: More than three times a week    Attends Religious Services: More than 4 times per year    Active Member of [employer] or Organizations: Yes    Attends Engineer, structural: More than 4 times per year    Marital Status: Married    Tobacco Counseling Counseling given: Not Answered    Clinical Intake:  Pre-visit preparation completed: Yes  Pain : No/denies pain     BMI - recorded: 31.89 Nutritional Status: BMI > 30  Obese Nutritional Risks: None Diabetes: No  Lab Results  Component Value Date   HGBA1C 6.2 09/02/2023     How often do you need to have someone help you when you read instructions, pamphlets, or other written materials from your doctor or pharmacy?: 1 - Never What is the last grade level you completed in school?: some college  Interpreter Needed?: No  Information entered by :: [employer]   Activities of Daily Living     10/12/2023   10:26 AM 09/30/2023    7:22 AM  In your present state of health, do you have any difficulty performing the following activities:  Hearing? 0 0  Vision? 0 0  Difficulty concentrating or making decisions? 0 0  Walking or climbing stairs? 1   Comment when ankle hurts   Dressing or bathing? 0   Doing errands, shopping? 0   Preparing Food and eating ? N   Using the Toilet? N   In the past six months, have you accidently leaked urine? N   Do you have problems with loss of bowel control? N   Managing your Medications? N   Managing your Finances? N   Housekeeping or managing your Housekeeping? N     Patient Care Team: Helaine Llanos, MD as PCP - General (Internal Medicine) End, Veryl Gottron, MD as PCP - Cardiology (  Cardiology) Boyce Byes, MD as PCP  - Electrophysiology (Cardiology)  Indicate any recent Medical Services you may have received from other than [facility] providers in the past year (date may be approximate).     Assessment:   This is a routine wellness examination for Julie Duncan.  Hearing/Vision screen Hearing Screening - Comments:: No hearing issues Vision Screening - Comments:: Wears glasses   Goals Addressed             This Visit's Progress    Patient Stated       Patient wants to stay busy       Depression Screen     10/12/2023   10:29 AM 10/26/2022   11:00 AM 10/26/2022   10:36 AM 04/23/2022   10:03 AM 11/21/2018   12:05 PM  PHQ 2/9 Scores  PHQ - 2 Score 2 0 0 0 0  PHQ- 9 Score 2        Fall Risk     10/12/2023   10:24 AM 10/26/2022   11:00 AM 10/26/2022   10:36 AM  Fall Risk   Falls in the past year? 0 0 0  Number falls in past yr: 0  0  Injury with Fall? 0  0  Risk for fall due to : No Fall Risks  No Fall Risks  Follow up Falls prevention discussed;Falls evaluation completed  Falls evaluation completed    MEDICARE RISK AT HOME:  Medicare Risk at Home Any stairs in or around the home?: Yes If so, are there any without handrails?: No Home free of loose throw rugs in walkways, pet beds, electrical cords, etc?: Yes Adequate lighting in your home to reduce risk of falls?: Yes Life alert?: No Use of a cane, walker or w/c?: No Grab bars in the bathroom?: Yes Shower chair or bench in shower?: Yes Elevated toilet seat or a handicapped toilet?: Yes  TIMED UP AND GO:  Was the test performed?  No  Cognitive Function: 6CIT completed        10/12/2023   10:19 AM  6CIT Screen  What Year? 0 points  What month? 0 points  What time? 0 points  Count back from 20 0 points  Months in reverse 0 points  Repeat phrase 0 points  Total Score 0 points    Immunizations Immunization History  Administered Date(s) Administered   Influenza, High Dose Seasonal PF 02/15/2018   Influenza-Unspecified 04/15/2022    Moderna Sars-Covid-2 Vaccination 07/05/2019, 08/02/2019, 04/30/2020   Pneumococcal Conjugate-13 03/23/2017   Pneumococcal Polysaccharide-23 01/25/2009, 07/05/2012, 03/25/2021   Td 06/22/2000   Unspecified SARS-COV-2 Vaccination 07/05/2019, 08/02/2019   Zoster Recombinant(Shingrix) 12/02/2017, 02/15/2018    Screening Tests Health Maintenance  Topic Date Due   DEXA SCAN  Never done   DTaP/Tdap/Td (2 - Tdap) 06/22/2010   COVID-19 Vaccine (6 - 2024-25 season) 02/21/2023   INFLUENZA VACCINE  01/21/2024   Medicare Annual Wellness (AWV)  10/11/2024   Pneumonia Vaccine 42+ Years old  Completed   Zoster Vaccines- Shingrix  Completed   HPV VACCINES  Aged Out   Meningococcal B Vaccine  Aged Out    Health Maintenance  Health Maintenance Due  Topic Date Due   DEXA SCAN  Never done   DTaP/Tdap/Td (2 - Tdap) 06/22/2010   COVID-19 Vaccine (6 - 2024-25 season) 02/21/2023   Health Maintenance Items Addressed:   Additional Screening:  Vision Screening: Recommended annual ophthalmology exams for early detection of glaucoma and other disorders of the eye.  Dental Screening:  Recommended annual dental exams for proper oral hygiene  Community Resource Referral / Chronic Care Management: CRR required this visit?  No   CCM required this visit?  No     Plan:     I have personally reviewed and noted the following in the patient's chart:   Medical and social history Use of alcohol, tobacco or illicit drugs  Current medications and supplements including opioid prescriptions. Patient is not currently taking opioid prescriptions. Functional ability and status Nutritional status Physical activity Advanced directives List of other physicians Hospitalizations, surgeries, and ER visits in previous 12 months Vitals Screenings to include cognitive, depression, and falls Referrals and appointments  In addition, I have reviewed and discussed with patient certain preventive protocols, quality  metrics, and best practice recommendations. A written personalized care plan for preventive services as well as general preventive health recommendations were provided to patient.     Freeda Jerry, [place]   10/12/2023   After Visit Summary: (MyChart) Due to this being a telephonic visit, the after visit summary with patients personalized plan was offered to patient via MyChart   Notes: Nothing significant to report at this time.

## 2023-10-14 ENCOUNTER — Ambulatory Visit: Payer: Self-pay | Admitting: Anesthesiology

## 2023-10-14 ENCOUNTER — Other Ambulatory Visit: Payer: Self-pay

## 2023-10-14 ENCOUNTER — Encounter: Payer: Self-pay | Admitting: Ophthalmology

## 2023-10-14 ENCOUNTER — Ambulatory Visit
Admission: RE | Admit: 2023-10-14 | Discharge: 2023-10-14 | Disposition: A | Discharge status: Home / Self Care | Attending: Ophthalmology | Admitting: Ophthalmology

## 2023-10-14 ENCOUNTER — Encounter
Admission: RE | Disposition: A | Discharge status: Home / Self Care | Payer: Self-pay | Source: Home / Self Care | Attending: Ophthalmology

## 2023-10-14 DIAGNOSIS — I2510 Atherosclerotic heart disease of native coronary artery without angina pectoris: Secondary | ICD-10-CM | POA: Diagnosis not present

## 2023-10-14 DIAGNOSIS — Z9842 Cataract extraction status, left eye: Secondary | ICD-10-CM | POA: Diagnosis not present

## 2023-10-14 DIAGNOSIS — H2511 Age-related nuclear cataract, right eye: Secondary | ICD-10-CM | POA: Diagnosis present

## 2023-10-14 DIAGNOSIS — Z961 Presence of intraocular lens: Secondary | ICD-10-CM | POA: Insufficient documentation

## 2023-10-14 DIAGNOSIS — I10 Essential (primary) hypertension: Secondary | ICD-10-CM | POA: Insufficient documentation

## 2023-10-14 SURGERY — PHACOEMULSIFICATION, CATARACT, WITH IOL INSERTION
Anesthesia: Monitor Anesthesia Care | Laterality: Right

## 2023-10-14 MED ORDER — SIGHTPATH DOSE#1 BSS IO SOLN
INTRAOCULAR | Status: DC | PRN
  Administered 2023-10-14: 74 mL via OPHTHALMIC

## 2023-10-14 MED ORDER — MOXIFLOXACIN HCL 0.5 % OP SOLN
OPHTHALMIC | Status: DC | PRN
  Administered 2023-10-14: .2 mL via OPHTHALMIC

## 2023-10-14 MED ORDER — SIGHTPATH DOSE#1 NA HYALUR & NA CHOND-NA HYALUR IO KIT
PACK | INTRAOCULAR | Status: DC | PRN
  Administered 2023-10-14: 1 via OPHTHALMIC

## 2023-10-14 MED ORDER — FENTANYL CITRATE (PF) 100 MCG/2ML IJ SOLN
INTRAMUSCULAR | Status: AC
  Filled 2023-10-14: qty 2

## 2023-10-14 MED ORDER — BRIMONIDINE TARTRATE-TIMOLOL 0.2-0.5 % OP SOLN
OPHTHALMIC | Status: DC | PRN
  Administered 2023-10-14: 1 [drp] via OPHTHALMIC

## 2023-10-14 MED ORDER — FENTANYL CITRATE (PF) 100 MCG/2ML IJ SOLN
INTRAMUSCULAR | Status: DC | PRN
  Administered 2023-10-14 (×2): 25 ug via INTRAVENOUS
  Administered 2023-10-14: 50 ug via INTRAVENOUS

## 2023-10-14 MED ORDER — MIDAZOLAM HCL 2 MG/2ML IJ SOLN
INTRAMUSCULAR | Status: AC
  Filled 2023-10-14: qty 2

## 2023-10-14 MED ORDER — ARMC OPHTHALMIC DILATING DROPS
1.0000 | OPHTHALMIC | Status: DC | PRN
  Administered 2023-10-14 (×3): 1 via OPHTHALMIC

## 2023-10-14 MED ORDER — LIDOCAINE HCL (PF) 2 % IJ SOLN
INTRAOCULAR | Status: DC | PRN
  Administered 2023-10-14: 1 mL via INTRAOCULAR

## 2023-10-14 MED ORDER — MIDAZOLAM HCL 2 MG/2ML IJ SOLN
INTRAMUSCULAR | Status: DC | PRN
  Administered 2023-10-14: 2 mg via INTRAVENOUS

## 2023-10-14 MED ORDER — TETRACAINE HCL 0.5 % OP SOLN
OPHTHALMIC | Status: AC
  Filled 2023-10-14: qty 4

## 2023-10-14 MED ORDER — BSS IO SOLN
INTRAOCULAR | Status: DC | PRN
  Administered 2023-10-14: 15 mL via INTRAOCULAR

## 2023-10-14 MED ORDER — ARMC OPHTHALMIC DILATING DROPS
OPHTHALMIC | Status: AC
  Filled 2023-10-14: qty 0.5

## 2023-10-14 MED ORDER — TETRACAINE HCL 0.5 % OP SOLN
1.0000 [drp] | OPHTHALMIC | Status: DC | PRN
  Administered 2023-10-14 (×3): 1 [drp] via OPHTHALMIC

## 2023-10-14 SURGICAL SUPPLY — 13 items
CATARACT SUITE SIGHTPATH (MISCELLANEOUS) ×1 IMPLANT
DISSECTOR HYDRO NUCLEUS 50X22 (MISCELLANEOUS) ×1 IMPLANT
DRSG TEGADERM 2-3/8X2-3/4 SM (GAUZE/BANDAGES/DRESSINGS) ×1 IMPLANT
FEE CATARACT SUITE SIGHTPATH (MISCELLANEOUS) ×1 IMPLANT
GLOVE BIOGEL PI IND STRL 8 (GLOVE) ×1 IMPLANT
GLOVE SURG LX STRL 7.5 STRW (GLOVE) ×1 IMPLANT
GLOVE SURG PROTEXIS BL SZ6.5 (GLOVE) ×1 IMPLANT
GLOVE SURG SYN 6.5 PF PI BL (GLOVE) ×1 IMPLANT
LENS IOL DIOP 19.5 (Intraocular Lens) ×1 IMPLANT
LENS IOL TECNIS MONO 19.5 (Intraocular Lens) IMPLANT
NDL FILTER BLUNT 18X1 1/2 (NEEDLE) ×1 IMPLANT
NEEDLE FILTER BLUNT 18X1 1/2 (NEEDLE) ×1 IMPLANT
SYR 3ML LL SCALE MARK (SYRINGE) ×1 IMPLANT

## 2023-10-14 NOTE — Op Note (Signed)
 OPERATIVE NOTE  Julie Duncan [id number] 10/14/2023   PREOPERATIVE DIAGNOSIS: Nuclear sclerotic cataract right eye. H25.11   POSTOPERATIVE DIAGNOSIS: Nuclear sclerotic cataract right eye. H25.11   PROCEDURE:  Phacoemusification with posterior chamber intraocular lens placement of the right eye  Ultrasound time: Procedure(s): PHACOEMULSIFICATION, CATARACT, WITH IOL INSERTION 13.84, 01:11.3 (Right)  LENS:   Implant Name Type Inv. Item Serial No. Manufacturer Lot No. LRB No. Used Action  LENS IOL DIOP 19.5 - [id number] Intraocular Lens LENS IOL DIOP 19.5 [id number] [employer]  Right 1 Implanted      SURGEON:  Rosy Cooper. Donalda Fruit, MD   ANESTHESIA:  Topical with tetracaine  drops, augmented with 1% preservative-free intracameral lidocaine .   COMPLICATIONS:  None.   DESCRIPTION OF PROCEDURE:  The patient was identified in the holding room and transported to the operating room and placed in the supine position under the operating microscope.  The right eye was identified as the operative eye, which was prepped and draped in the usual sterile ophthalmic fashion.   A 1 millimeter clear-corneal paracentesis was made superotemporally. Preservative-free 1% lidocaine  mixed with 1:1,000 bisulfite-free aqueous solution of epinephrine  was injected into the anterior chamber. The anterior chamber was then filled with Viscoat viscoelastic. A 2.4 millimeter keratome was used to make a clear-corneal incision inferotemporally. A curvilinear capsulorrhexis was made with a cystotome and capsulorrhexis forceps. Balanced salt  solution was used to hydrodissect and hydrodelineate the nucleus. Phacoemulsification was then used to remove the lens nucleus and epinucleus. The remaining cortex was then removed using the irrigation and aspiration handpiece. Provisc was then placed into the capsular bag to distend it for lens placement. A +19.50 D [id number] intraocular lens was then injected into the capsular bag. The remaining  viscoelastic was aspirated.   Wounds were hydrated with balanced salt  solution.  The anterior chamber was inflated to a physiologic pressure with balanced salt  solution.  No wound leaks were noted. Moxifloxacin  was injected intracamerally.  Timolol  and Brimonidine  drops were applied to the eye.  The patient was taken to the recovery room in stable condition without complications of anesthesia or surgery.  Meryl Acosta [place] 10/14/2023, 7:58 AM

## 2023-10-14 NOTE — Transfer of Care (Signed)
 Immediate Anesthesia Transfer of Care Note  Patient: Julie Duncan  Procedure(s) Performed: PHACOEMULSIFICATION, CATARACT, WITH IOL INSERTION 13.84, 01:11.3 (Right)  Patient Location: PACU  Anesthesia Type: MAC  Level of Consciousness: awake, alert  and patient cooperative  Airway and Oxygen Therapy: Patient Spontanous Breathing and Patient connected to supplemental oxygen  Post-op Assessment: Post-op Vital signs reviewed, Patient's Cardiovascular Status Stable, Respiratory Function Stable, Patent Airway and No signs of Nausea or vomiting  Post-op Vital Signs: Reviewed and stable  Complications: No notable events documented.

## 2023-10-14 NOTE — Anesthesia Postprocedure Evaluation (Signed)
 Anesthesia Post Note  Patient: Julie Duncan  Procedure(s) Performed: PHACOEMULSIFICATION, CATARACT, WITH IOL INSERTION 13.84, 01:11.3 (Right)  Patient location during evaluation: PACU Anesthesia Type: MAC Level of consciousness: awake and alert Pain management: pain level controlled Vital Signs Assessment: post-procedure vital signs reviewed and stable Respiratory status: spontaneous breathing, nonlabored ventilation, respiratory function stable and patient connected to nasal cannula oxygen Cardiovascular status: stable and blood pressure returned to baseline Postop Assessment: no apparent nausea or vomiting Anesthetic complications: no   No notable events documented.   Last Vitals:  Vitals:   10/14/23 0801 10/14/23 0807  BP: (!) 158/69 (!) 157/80  Pulse: (!) 51 75  Resp: 16 13  Temp: (!) 36.3 C (!) 36.3 C  SpO2: 97% 97%    Last Pain:  Vitals:   10/14/23 0807  PainSc: 4                  Cherylynn Liszewski C Daniel Johndrow

## 2023-10-14 NOTE — H&P (Signed)
 [facility]   Primary Care Physician:  Helaine Llanos, MD Ophthalmologist: Dr. Meg Spina  Pre-Procedure History & Physical: HPI:  Julie Duncan is a 83 y.o. female here for cataract surgery.   Past Medical History:  Diagnosis Date   Arthritis    hands and ankles   Atrial flutter (HCC)    Atrial myxoma    a. 2016 s/p resection ([facility]); b. 04/2017 Echo: EF 60-65%, no rwma, mild AI/MR. Mild-mod TR. Nl PASP; c. 07/2021 Echo: EF 60-65%, no rwma, nl RV fxn, mild MR/AI. Ao sclerosis.   Coronary artery disease 03/27/2022   50% mid LAD stenosis (not significant by iFR)   Excessive daytime sleepiness    History of stress test    a. 04/2017 ETT: Poor ex capacity. No ischemia. Episode of PSVT noted.   Hyperlipidemia    Hypertension    Interstitial lung disease (HCC)    Mild aortic regurgitation    Mobitz I    Mobitz II    Paroxysmal atrial fibrillation (HCC)    a. CHA2DS2VASc = 4.  Did not tolerate Warfarin. Difficulty in affording DOAC. Eliquis  added 07/2021 (new Aflutter).   Paroxysmal SVT (supraventricular tachycardia) (HCC)    a. 06/2020 Zio: 3813 SVT runs - longest 30m 47s, max rate 231; b. 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   Wears dentures    partial upper and lower.  no longer has lower    Past Surgical History:  Procedure Laterality Date   ANKLE SURGERY Left    ATRIAL FIBRILLATION ABLATION N/A 06/05/2022   Procedure: ATRIAL FIBRILLATION ABLATION;  Surgeon: Boyce Byes, MD;  Location: [facility];  Service: Cardiovascular;  Laterality: N/A;   CARDIOVERSION N/A 08/26/2021   Procedure: CARDIOVERSION;  Surgeon: Sammy Crisp, MD;  Location: [facility] ORS;  Service: Cardiovascular;  Laterality: N/A;   CATARACT EXTRACTION W/PHACO Left 09/30/2023   Procedure: PHACOEMULSIFICATION, CATARACT, WITH IOL INSERTION 13.77 01:16.2;  Surgeon: Trudi Fus, MD;  Location: [facility];  Service: Ophthalmology;  Laterality: Left;   CORONARY PRESSURE/FFR STUDY  N/A 03/27/2022   Procedure: INTRAVASCULAR PRESSURE WIRE/FFR STUDY;  Surgeon: Sammy Crisp, MD;  Location: [facility] INVASIVE CV LAB;  Service: Cardiovascular;  Laterality: N/A;   EXCISION OF ATRIAL MYXOMA     LEFT HEART CATH AND CORONARY ANGIOGRAPHY N/A 03/27/2022   Procedure: LEFT HEART CATH AND CORONARY ANGIOGRAPHY;  Surgeon: Sammy Crisp, MD;  Location: [facility] INVASIVE CV LAB;  Service: Cardiovascular;  Laterality: N/A;   PARTIAL HYSTERECTOMY     SVT ABLATION N/A 07/30/2020   Procedure: SVT ABLATION;  Surgeon: Tammie Fall, MD;  Location: [facility];  Service: Cardiovascular;  Laterality: N/A;   WRIST SURGERY Right     Prior to Admission medications   Medication Sig Start Date End Date Taking? Authorizing Provider  ascorbic acid (VITAMIN C) 500 MG tablet Take 500 mg by mouth daily.   Yes [provider]  aspirin  EC 81 MG tablet Take 1 tablet (81 mg total) by mouth daily. Swallow whole. 09/23/22  Yes Boyce Byes, MD  Cholecalciferol (VITAMIN D PO) Take 1 tablet by mouth daily.   Yes [provider]  Coenzyme Q10 10 MG capsule Take 10 mg by mouth daily.   Yes [provider]  cyanocobalamin  50 MCG tablet Take 1 tablet by mouth daily.   Yes [provider]  Ginkgo Biloba 40 MG TABS Take 1 tablet by mouth daily.   Yes [provider]  losartan  (COZAAR ) 50 MG tablet Take 1 tablet (50 mg total) by mouth 2 (two) times daily. 01/19/23  Yes End, Veryl Gottron, MD  rosuvastatin  (CRESTOR ) 10 MG tablet Take 1 tablet (10 mg total) by mouth daily. 09/02/23  Yes Dugal, Tabitha, FNP  VITAMIN E PO Take 1 tablet by mouth daily.   Yes [provider]    Allergies as of 09/10/2023 - Review Complete 09/03/2023  Allergen Reaction Noted   Cortisone Other (See Comments) 08/26/2021   Amlodipine  Itching 01/08/2022   Red yeast rice  [cholestin] Other (See Comments) 07/27/2012   Saccharin Other (See Comments) 07/27/2012   Warfarin and related   07/29/2021    Family History  Problem Relation Age of Onset   Stroke Mother 36   Heart attack Father 20   Cancer Father        throat   Cancer Sister 75       lung   Cancer Brother        lung   Heart attack Brother 28    Social History   Socioeconomic History   Marital status: Widowed    Spouse name: Not on file   Number of children: 2   Years of education: Not on file   Highest education level: Not on file  Occupational History   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 Has a cat.   Daughter in [place]. Son in [place]      Has living will   Daughter is health care POA   Would accept resuscitation   Not sure about tube feeds   Social Drivers of Health   Financial Resource Strain: Low Risk  (10/12/2023)   Overall Financial Resource Strain (CARDIA)    Difficulty of Paying Living Expenses: Not hard at all  Food Insecurity: No Food Insecurity (10/12/2023)   Hunger Vital Sign    Worried About Running Out of Food in the Last Year: Never true    Ran Out of Food in the Last Year: Never true  Transportation Needs: No Transportation Needs (10/12/2023)   PRAPARE - Administrator, Civil Service (Medical): No    Lack of Transportation (Non-Medical): No  Physical Activity: Insufficiently Active (10/12/2023)   Exercise Vital Sign    Days of Exercise per Week: 3 days    Minutes of Exercise per Session: 20 min  Stress: No Stress Concern Present (10/12/2023)   [employer] of Occupational Health - Occupational Stress Questionnaire    Feeling of Stress : Not at all  Social Connections: Socially Integrated (10/12/2023)   Social Connection and  Isolation Panel [NHANES]    Frequency of Communication with Friends and Family: More than three times a week    Frequency of Social Gatherings with Friends and Family: More than three times a week    Attends Religious Services: More than 4 times per year    Active Member of [employer] or Organizations: Yes    Attends Banker Meetings: More than 4 times per year    Marital Status: Married  Catering manager Violence: Not At Risk (10/12/2023)   Humiliation, Afraid,  Rape, and Kick questionnaire    Fear of Current or Ex-Partner: No    Emotionally Abused: No    Physically Abused: No    Sexually Abused: No    Review of Systems: See HPI, otherwise negative ROS  Physical Exam: BP (!) 175/86   Pulse 89   Temp 97.6 F (36.4 C)   Wt 83 kg   SpO2 97%   BMI 32.42 kg/m  General:   Alert, cooperative in NAD Head:  Normocephalic and atraumatic. Respiratory:  Normal work of breathing. Cardiovascular:  RRR  Impression/Plan: Geanna B Curenton is here for cataract surgery.  Risks, benefits, limitations, and alternatives regarding cataract surgery have been reviewed with the patient.  Questions have been answered.  All parties agreeable.   Trudi Fus, MD  10/14/2023, 7:21 AM

## 2023-10-28 ENCOUNTER — Encounter: Payer: PPO | Admitting: Internal Medicine

## 2023-11-18 ENCOUNTER — Encounter: Payer: Self-pay | Admitting: Cardiology

## 2023-11-18 ENCOUNTER — Ambulatory Visit: Attending: Cardiology | Admitting: Cardiology

## 2023-11-18 VITALS — BP 128/58 | HR 89 | Ht 63.0 in | Wt 180.8 lb

## 2023-11-18 DIAGNOSIS — I10 Essential (primary) hypertension: Secondary | ICD-10-CM

## 2023-11-18 DIAGNOSIS — R002 Palpitations: Secondary | ICD-10-CM

## 2023-11-18 DIAGNOSIS — I4892 Unspecified atrial flutter: Secondary | ICD-10-CM

## 2023-11-18 DIAGNOSIS — I4891 Unspecified atrial fibrillation: Secondary | ICD-10-CM

## 2023-11-18 DIAGNOSIS — I2510 Atherosclerotic heart disease of native coronary artery without angina pectoris: Secondary | ICD-10-CM

## 2023-11-18 DIAGNOSIS — E782 Mixed hyperlipidemia: Secondary | ICD-10-CM
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
## 2023-11-18 NOTE — Patient Instructions (Signed)
 Medication Instructions:  Your physician recommends the following medication changes.  STOP TAKING: B12  *If you need a refill on your cardiac medications before your next appointment, please call your pharmacy*  Lab Work: No labs ordered today  If you have labs (blood work) drawn today and your tests are completely normal, you will receive your results only by: MyChart Message (if you have MyChart) OR A paper copy in the mail If you have any lab test that is abnormal or we need to change your treatment, we will call you to review the results.  Testing/Procedures: No test ordered today   Follow-Up: At [facility], you and your health needs are our priority.  As part of our continuing mission to provide you with exceptional heart care, our providers are all part of one team.  This team includes your primary Cardiologist (physician) and Advanced Practice Providers or APPs (Physician Assistants and Nurse Practitioners) who all work together to provide you with the care you need, when you need it.  Your next appointment:   12 month(s)  Provider:   Sammy Crisp, MD or Ronald Cockayne, NP    We recommend signing up for the patient portal called "MyChart".  Sign up information is provided on this After Visit Summary.  MyChart is used to connect with patients for Virtual Visits (Telemedicine).  Patients are able to view lab/test results, encounter notes, upcoming appointments, etc.  Non-urgent messages can be sent to your provider as well.   To learn more about what you can do with MyChart, go to [email or web address].

## 2023-12-03 ENCOUNTER — Other Ambulatory Visit: Payer: Self-pay | Admitting: Family

## 2023-12-03 DIAGNOSIS — E782 Mixed hyperlipidemia: Secondary | ICD-10-CM

## 2023-12-29 ENCOUNTER — Telehealth: Payer: Self-pay | Admitting: Internal Medicine

## 2023-12-29 ENCOUNTER — Ambulatory Visit: Admitting: Internal Medicine

## 2023-12-29 ENCOUNTER — Encounter: Payer: Self-pay | Admitting: Internal Medicine

## 2023-12-29 VITALS — BP 130/80 | HR 71 | Temp 98.6°F | Ht 64.0 in | Wt 177.0 lb

## 2023-12-29 DIAGNOSIS — R200 Anesthesia of skin: Secondary | ICD-10-CM | POA: Diagnosis not present

## 2023-12-29 DIAGNOSIS — J849 Interstitial pulmonary disease, unspecified: Secondary | ICD-10-CM | POA: Diagnosis not present

## 2023-12-29 DIAGNOSIS — I2510 Atherosclerotic heart disease of native coronary artery without angina pectoris: Secondary | ICD-10-CM

## 2023-12-29 DIAGNOSIS — Z0000 Encounter for general adult medical examination without abnormal findings: Secondary | ICD-10-CM

## 2023-12-29 DIAGNOSIS — N1831 Chronic kidney disease, stage 3a: Secondary | ICD-10-CM

## 2023-12-29 DIAGNOSIS — I480 Paroxysmal atrial fibrillation: Secondary | ICD-10-CM

## 2023-12-29 LAB — HEPATIC FUNCTION PANEL
ALT: 9 U/L (ref 0–35)
AST: 15 U/L (ref 0–37)
Albumin: 4 g/dL (ref 3.5–5.2)
Alkaline Phosphatase: 139 U/L — ABNORMAL HIGH (ref 39–117)
Bilirubin, Direct: 0.1 mg/dL (ref 0.0–0.3)
Total Bilirubin: 0.6 mg/dL (ref 0.2–1.2)
Total Protein: 6.9 g/dL (ref 6.0–8.3)

## 2023-12-29 LAB — LIPID PANEL
Cholesterol: 244 mg/dL — ABNORMAL HIGH (ref 0–200)
HDL: 49 mg/dL (ref >39.00)
LDL Cholesterol: 156 mg/dL — ABNORMAL HIGH (ref 0–99)
NonHDL: 194.87
Total CHOL/HDL Ratio: 5
Triglycerides: 194 mg/dL — ABNORMAL HIGH (ref 0.0–149.0)
VLDL: 38.8 mg/dL (ref 0.0–40.0)

## 2023-12-29 LAB — CBC
HCT: 36.3 % (ref 36.0–46.0)
Hemoglobin: 12.5 g/dL (ref 12.0–15.0)
MCHC: 34.6 g/dL (ref 30.0–36.0)
MCV: 92.9 fl (ref 78.0–100.0)
Platelets: 276 K/uL (ref 150.0–400.0)
RBC: 3.9 Mil/uL (ref 3.87–5.11)
RDW: 14.6 % (ref 11.5–15.5)
WBC: 4.9 K/uL (ref 4.0–10.5)

## 2023-12-29 LAB — RENAL FUNCTION PANEL
Albumin: 4 g/dL (ref 3.5–5.2)
BUN: 12 mg/dL (ref 6–23)
CO2: 35 meq/L — ABNORMAL HIGH (ref 19–32)
Calcium: 9.2 mg/dL (ref 8.4–10.5)
Chloride: 103 meq/L (ref 96–112)
Creatinine, Ser: 1.06 mg/dL (ref 0.40–1.20)
GFR: 48 mL/min — ABNORMAL LOW (ref >60.00)
Glucose, Bld: 97 mg/dL (ref 70–99)
Phosphorus: 3.7 mg/dL (ref 2.3–4.6)
Potassium: 3.2 meq/L — ABNORMAL LOW (ref 3.5–5.1)
Sodium: 144 meq/L (ref 135–145)

## 2023-12-29 LAB — TSH: TSH: 1.16 u[IU]/mL (ref 0.35–5.50)

## 2023-12-29 LAB — VITAMIN B12: Vitamin B-12: >1500 pg/mL — ABNORMAL HIGH (ref 211–911)

## 2023-12-29 NOTE — Assessment & Plan Note (Signed)
 Healthy Stays active with senior center, etc No cancer screening for age Td booster at pharmacy Flu vaccine in the fall--doesn't want COVID boosters

## 2023-12-29 NOTE — Patient Instructions (Signed)
 Please get a tetanus booster at the pharmacy---and RSV (if you haven't had it yet) Get a flu vaccine in the fall (September or October)

## 2023-12-29 NOTE — Assessment & Plan Note (Signed)
 Doesn't seem to have progressed No regular cough

## 2023-12-29 NOTE — Telephone Encounter (Signed)
 Pt asked during checkout if Dr. Cleatus would accept her as a new pt since Dr. Jimmy is retiring. Pt states her daughter, Loa Dayhoff, is a current pt of Dr. Elfredia. Per dr. Sherrin, pt needs to return in 1 year for a awv. Is this TOC okay? Call back # [phone]

## 2023-12-29 NOTE — Telephone Encounter (Signed)
 Sending note to [facility] admin.

## 2023-12-29 NOTE — Progress Notes (Signed)
 Subjective:    Patient ID: Julie Duncan, female    DOB: Dec 15, 1940, 83 y.o.   MRN: [id number]  HPI Here for physical  Having pain in ankles---has seen multiple orthopedists about this Trouble walking---like they don't work right Notes numbness in toes on left Past ankle replacement on left  Stopped the rosuvastatin ---because it has calcium  in it Discussed that this is just a medication salt--and not calcium  supplement She feels her shoulder is some better  Reviewed labs GFR in 50's over the past few years  Feels all her heart troubles have been after cortisone shots Did have atrial fib ablation No palpitations No chest pain or SOB No dizziness or syncope  Current Outpatient Medications on File Prior to Visit  Medication Sig Dispense Refill   ascorbic acid (VITAMIN C) 500 MG tablet Take 500 mg by mouth daily.     aspirin  EC 81 MG tablet Take 1 tablet (81 mg total) by mouth daily. Swallow whole. 90 tablet 3   Cholecalciferol (VITAMIN D PO) Take 1 tablet by mouth daily.     Coenzyme Q10 10 MG capsule Take 10 mg by mouth daily.     cyanocobalamin  50 MCG tablet Take 1 tablet by mouth daily.     Ginkgo Biloba 40 MG TABS Take 1 tablet by mouth daily.     losartan  (COZAAR ) 50 MG tablet Take 1 tablet (50 mg total) by mouth 2 (two) times daily. 180 tablet 2   VITAMIN E PO Take 1 tablet by mouth daily.     No current facility-administered medications on file prior to visit.    Allergies  Allergen Reactions   Cortisone Other (See Comments)    Patient states cortisone injections have messes with my heart.  Has had cortisone shots and each time causes heart rhythm problems.   Amlodipine  Itching   Calcium -Containing Compounds     Shoulder pain   Red Yeast Rice  [Cholestin] Other (See Comments)   Saccharin Other (See Comments)    Kidney infections   Warfarin And Related     anorexia    Past Medical History:  Diagnosis Date   Arthritis    hands and ankles   Atrial  flutter (HCC)    Atrial myxoma    a. 2016 s/p resection ([facility]); b. 04/2017 Echo: EF 60-65%, no rwma, mild AI/MR. Mild-mod TR. Nl PASP; c. 07/2021 Echo: EF 60-65%, no rwma, nl RV fxn, mild MR/AI. Ao sclerosis.   Coronary artery disease 03/27/2022   50% mid LAD stenosis (not significant by iFR)   Excessive daytime sleepiness    History of stress test    a. 04/2017 ETT: Poor ex capacity. No ischemia. Episode of PSVT noted.   Hyperlipidemia    Hypertension    Interstitial lung disease (HCC)    Mild aortic regurgitation    Mobitz I    Mobitz II    Paroxysmal atrial fibrillation (HCC)    a. CHA2DS2VASc = 4.  Did not tolerate Warfarin. Difficulty in affording DOAC. Eliquis  added 07/2021 (new Aflutter).   Paroxysmal SVT (supraventricular tachycardia) (HCC)    a. 06/2020 Zio: 3813 SVT runs - longest 26m 47s, max rate 231; b. 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   Wears dentures    partial upper and lower.  no longer has lower    Past Surgical History:  Procedure Laterality Date   ANKLE SURGERY Left    ATRIAL FIBRILLATION ABLATION N/A 06/05/2022   Procedure: ATRIAL FIBRILLATION ABLATION;  Surgeon:  Cindie Ole DASEN, MD;  Location: [facility] INVASIVE CV LAB;  Service: Cardiovascular;  Laterality: N/A;   CARDIOVERSION N/A 08/26/2021   Procedure: CARDIOVERSION;  Surgeon: Mady Bruckner, MD;  Location: [facility] ORS;  Service: Cardiovascular;  Laterality: N/A;   CATARACT EXTRACTION W/PHACO Left 09/30/2023   Procedure: PHACOEMULSIFICATION, CATARACT, WITH IOL INSERTION 13.77 01:16.2;  Surgeon: [name], MD;  Location: [facility] SURGERY CNTR;  Service: Ophthalmology;  Laterality: Left;   CATARACT EXTRACTION W/PHACO Right 10/14/2023   Procedure: PHACOEMULSIFICATION, CATARACT, WITH IOL INSERTION 13.84, 01:11.3;  Surgeon: [name], MD;  Location: [facility] SURGERY [facility];  Service: Ophthalmology;  Laterality: Right;   CORONARY PRESSURE/FFR STUDY N/A 03/27/2022   Procedure: INTRAVASCULAR PRESSURE  WIRE/FFR STUDY;  Surgeon: Mady Bruckner, MD;  Location: [facility] INVASIVE CV LAB;  Service: Cardiovascular;  Laterality: N/A;   EXCISION OF ATRIAL MYXOMA     LEFT HEART CATH AND CORONARY ANGIOGRAPHY N/A 03/27/2022   Procedure: LEFT HEART CATH AND CORONARY ANGIOGRAPHY;  Surgeon: Mady Bruckner, MD;  Location: [facility] INVASIVE CV LAB;  Service: Cardiovascular;  Laterality: N/A;   PARTIAL HYSTERECTOMY     SVT ABLATION N/A 07/30/2020   Procedure: SVT ABLATION;  Surgeon: [name], MD;  Location: [facility] INVASIVE CV LAB;  Service: Cardiovascular;  Laterality: N/A;   WRIST SURGERY Right     Family History  Problem Relation Age of Onset   Stroke Mother 58   Heart attack Father 64   Cancer Father        throat   Cancer Sister 18       lung   Cancer Brother        lung   Heart attack Brother 49    Social History   Socioeconomic History   Marital status: Widowed    Spouse name: Not on file   Number of children: 2   Years of education: Not on file   Highest education level: Not on file  Occupational History   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 Has a cat.   Daughter in [place]. Son in [place]      Has living will   Daughter is health care POA   Would accept resuscitation   Not sure about tube feeds   Social Drivers of Health   Financial Resource Strain: Low Risk  (10/12/2023)   Overall Financial Resource Strain (CARDIA)    Difficulty of Paying Living Expenses: Not hard at all  Food Insecurity: No Food Insecurity (10/12/2023)   Hunger Vital Sign    Worried About Running Out of Food in the Last Year: Never true    Ran Out of Food in the Last Year: Never true   Transportation Needs: No Transportation Needs (10/12/2023)   PRAPARE - Administrator, Civil Service (Medical): No    Lack of Transportation (Non-Medical): No  Physical Activity: Insufficiently Active (10/12/2023)   Exercise Vital Sign    Days of Exercise per Week: 3 days    Minutes of Exercise per Session: 20 min  Stress: No Stress Concern Present (10/12/2023)   [employer] of Occupational Health - Occupational Stress  Questionnaire    Feeling of Stress : Not at all  Social Connections: Socially Integrated (10/12/2023)   Social Connection and Isolation Panel    Frequency of Communication with Friends and Family: More than three times a week    Frequency of Social Gatherings with Friends and Family: More than three times a week    Attends Religious Services: More than 4 times per year    Active Member of [employer] or Organizations: Yes    Attends Engineer, structural: More than 4 times per year    Marital Status: Married  Catering manager Violence: Not At Risk (10/12/2023)   Humiliation, Afraid, Rape, and Kick questionnaire    Fear of Current or Ex-Partner: No    Emotionally Abused: No    Physically Abused: No    Sexually Abused: No   Review of Systems  Constitutional:        Weight down slightly Wears seat belt  HENT:  Negative for dental problem, hearing loss and tinnitus.        Keeps up with dentist  Eyes:        Feels eyes are worse since cataract surgery  Respiratory:  Negative for cough, chest tightness and shortness of breath.   Cardiovascular:  Negative for chest pain, palpitations and leg swelling.  Gastrointestinal:  Negative for blood in stool and constipation.       No heartburn  Endocrine: Negative for polydipsia and polyuria.  Genitourinary:  Negative for dysuria and hematuria.  Musculoskeletal:  Positive for arthralgias and back pain. Negative for joint swelling.  Skin:  Negative for rash.  Allergic/Immunologic: Negative for environmental  allergies and immunocompromised state.  Neurological:  Negative for dizziness, syncope, light-headedness and headaches.  Hematological:  Negative for adenopathy. Bruises/bleeds easily.  Psychiatric/Behavioral:  Negative for dysphoric mood and sleep disturbance. The patient is not nervous/anxious.        Objective:   Physical Exam Constitutional:      Appearance: Normal appearance.  HENT:     Mouth/Throat:     Pharynx: No oropharyngeal exudate or posterior oropharyngeal erythema.  Eyes:     Conjunctiva/sclera: Conjunctivae normal.     Pupils: Pupils are equal, round, and reactive to light.  Cardiovascular:     Rate and Rhythm: Normal rate and regular rhythm.     Pulses: Normal pulses.     Heart sounds: No murmur heard.    No gallop.  Pulmonary:     Effort: Pulmonary effort is normal.     Breath sounds: Normal breath sounds. No wheezing or rales.  Abdominal:     Palpations: Abdomen is soft.     Tenderness: There is no abdominal tenderness.  Musculoskeletal:     Cervical back: Neck supple.     Right lower leg: No edema.     Left lower leg: No edema.     Comments: Decreased ROM in left ankle--from replacement  Lymphadenopathy:     Cervical: No cervical adenopathy.  Skin:    Findings: No rash.  Neurological:     General: No focal deficit present.     Mental Status: She is alert and oriented to person, place, and time.     Comments: Slow gait but not antalgic  Psychiatric:        Mood and Affect: Mood normal.        Behavior: Behavior normal.            Assessment & Plan:

## 2023-12-29 NOTE — Assessment & Plan Note (Signed)
 50% in one vessel only Discussed the statin---wouldn't be a bad idea to continue but she is opposed to this

## 2023-12-29 NOTE — Telephone Encounter (Signed)
 Would schedule next yearly visit with me.  Shouldn't need TOC in near future.  Thanks.

## 2023-12-29 NOTE — Assessment & Plan Note (Signed)
 No symptomatic recurrence since ablation On ASA daily

## 2023-12-29 NOTE — Assessment & Plan Note (Signed)
 Mildly low Will recheck Is on losartan  50

## 2023-12-29 NOTE — Assessment & Plan Note (Signed)
 Mild in feet ?related to statin Discussed using walking stick/cane Will check labs

## 2023-12-30 ENCOUNTER — Other Ambulatory Visit: Payer: Self-pay | Admitting: Internal Medicine

## 2023-12-30 ENCOUNTER — Ambulatory Visit: Payer: Self-pay | Admitting: Internal Medicine

## 2023-12-30 DIAGNOSIS — E876 Hypokalemia: Secondary | ICD-10-CM

## 2024-01-03 ENCOUNTER — Other Ambulatory Visit: Payer: Self-pay | Admitting: Internal Medicine

## 2024-01-06 ENCOUNTER — Other Ambulatory Visit

## 2024-01-06 ENCOUNTER — Other Ambulatory Visit (INDEPENDENT_AMBULATORY_CARE_PROVIDER_SITE_OTHER)

## 2024-01-06 DIAGNOSIS — E876 Hypokalemia: Secondary | ICD-10-CM | POA: Diagnosis not present

## 2024-01-07 LAB — RENAL FUNCTION PANEL
Albumin: 3.9 g/dL (ref 3.5–5.2)
BUN: 12 mg/dL (ref 6–23)
CO2: 30 meq/L (ref 19–32)
Calcium: 8.8 mg/dL (ref 8.4–10.5)
Chloride: 104 meq/L (ref 96–112)
Creatinine, Ser: 1.02 mg/dL (ref 0.40–1.20)
GFR: 50.26 mL/min — ABNORMAL LOW (ref >60.00)
Glucose, Bld: 95 mg/dL (ref 70–99)
Phosphorus: 3.5 mg/dL (ref 2.3–4.6)
Potassium: 3.5 meq/L (ref 3.5–5.1)
Sodium: 144 meq/L (ref 135–145)

## 2024-01-08 ENCOUNTER — Ambulatory Visit: Payer: Self-pay | Admitting: Internal Medicine

## 2024-01-12 NOTE — Telephone Encounter (Signed)
 Copied from CRM [id number]. Topic: Clinical - Lab/Test Results >> Jan 11, 2024  4:59 PM Harlene ORN wrote: Reason for CRM: Patient is returning call from the Practice about her test results.

## 2024-02-24 ENCOUNTER — Ambulatory Visit: Admitting: Internal Medicine

## 2024-03-15 ENCOUNTER — Ambulatory Visit: Payer: Self-pay

## 2024-03-15 NOTE — Telephone Encounter (Signed)
 Wouldn't necessarily wait until Friday to be seen for new exertional shortness of breath as she does have cardiac and pulmonary history (HTN, CAD, h/o afib, ILD listed in chart) - are there any available appointments sooner ie tomorrow? At minimum can she check her pulse to ensure not >100 or irregular?  If worsening symptoms, would recommend UCC or ER evaluation.

## 2024-03-15 NOTE — Telephone Encounter (Signed)
 Called patient have moved appointment to tomorrow. Given ED and urgent care precautions. She agreed and will go if any of changes below.

## 2024-03-15 NOTE — Telephone Encounter (Signed)
 FYI Only or Action Required?: FYI only for provider.  Patient was last seen in primary care on 12/29/2023 by Jimmy Charlie FERNS, MD.  Called Nurse Triage reporting Shortness of Breath.  Symptoms began several days ago.  Interventions attempted: Nothing.  Symptoms are: unchanged.  Triage Disposition: See PCP When Office is Open (Within 3 Days)  Patient/caregiver understands and will follow disposition?: Yes     Copied from CRM #[id number]. Topic: Clinical - Red Word Triage >> Mar 15, 2024  1:13 PM Aisha D wrote: Red Word that prompted transfer to Nurse Triage: SOB  Pt stated that she is experiencing SOB whenever she gets up to walk around. Pt would like to schedule an appt with the provider.   ----------------------------------------------------------------------- From previous Reason for Contact - Scheduling: Patient/patient representative is calling to schedule an appointment. Refer to attachments for appointment information. Reason for Disposition  [1] MODERATE longstanding difficulty breathing (e.g., speaks in phrases, SOB even at rest, pulse 100-120) AND [2] SAME as normal  Answer Assessment - Initial Assessment Questions 1. RESPIRATORY STATUS: Describe your breathing? (e.g., wheezing, shortness of breath, unable to speak, severe coughing)      sob 2. ONSET: When did this breathing problem begin?      Last 5-6 days 3. PATTERN Does the difficult breathing come and go, or has it been constant since it started?      Comes and goes when she gets up 4. SEVERITY: How bad is your breathing? (e.g., mild, moderate, severe)      Mild to moderte 5. RECURRENT SYMPTOM: Have you had difficulty breathing before? If Yes, ask: When was the last time? and What happened that time?      no 6. CARDIAC HISTORY: Do you have any history of heart disease? (e.g., heart attack, angina, bypass surgery, angioplasty)      no 7. LUNG HISTORY: Do you have any history of lung disease?   (e.g., pulmonary embolus, asthma, emphysema)     no 8. CAUSE: What do you think is causing the breathing problem?      unknown 9. OTHER SYMPTOMS: Do you have any other symptoms? (e.g., chest pain, cough, dizziness, fever, runny nose)     denies 10. O2 SATURATION MONITOR:  Do you use an oxygen saturation monitor (pulse oximeter) at home? If Yes, ask: What is your reading (oxygen level) today? What is your usual oxygen saturation reading? (e.g., 95%)       na 11. PREGNANCY: Is there any chance you are pregnant? When was your last menstrual period?       na 12. TRAVEL: Have you traveled out of the country in the last month? (e.g., travel history, exposures)       no  Protocols used: Breathing Difficulty-A-AH

## 2024-03-16 ENCOUNTER — Encounter: Payer: Self-pay | Admitting: Family

## 2024-03-16 ENCOUNTER — Telehealth: Payer: Self-pay | Admitting: Family

## 2024-03-16 ENCOUNTER — Ambulatory Visit: Admitting: Family

## 2024-03-16 VITALS — BP 156/78 | HR 75 | Temp 98.9°F | Ht 63.0 in | Wt 176.6 lb

## 2024-03-16 DIAGNOSIS — Z23 Encounter for immunization: Secondary | ICD-10-CM | POA: Diagnosis not present

## 2024-03-16 DIAGNOSIS — G4719 Other hypersomnia: Secondary | ICD-10-CM

## 2024-03-16 LAB — CBC
HCT: 35.6 % — ABNORMAL LOW (ref 36.0–46.0)
Hemoglobin: 11.5 g/dL — ABNORMAL LOW (ref 12.0–15.0)
MCHC: 32.4 g/dL (ref 30.0–36.0)
MCV: 87.2 fl (ref 78.0–100.0)
Platelets: 359 K/uL (ref 150.0–400.0)
RBC: 4.08 Mil/uL (ref 3.87–5.11)
RDW: 14.8 % (ref 11.5–15.5)
WBC: 6.2 K/uL (ref 4.0–10.5)

## 2024-03-16 LAB — B12 AND FOLATE PANEL
Folate: 13.3 ng/mL (ref >5.9)
Vitamin B-12: >1500 pg/mL — ABNORMAL HIGH (ref 211–911)

## 2024-03-16 LAB — BASIC METABOLIC PANEL WITH GFR
BUN: 15 mg/dL (ref 6–23)
CO2: 30 meq/L (ref 19–32)
Calcium: 9.2 mg/dL (ref 8.4–10.5)
Chloride: 104 meq/L (ref 96–112)
Creatinine, Ser: 1.14 mg/dL (ref 0.40–1.20)
GFR: 43.92 mL/min — ABNORMAL LOW (ref >60.00)
Glucose, Bld: 111 mg/dL — ABNORMAL HIGH (ref 70–99)
Potassium: 3 meq/L — ABNORMAL LOW (ref 3.5–5.1)
Sodium: 145 meq/L (ref 135–145)

## 2024-03-16 LAB — TSH: TSH: 1.33 u[IU]/mL (ref 0.35–5.50)

## 2024-03-16 NOTE — Progress Notes (Signed)
 Established Patient Office Visit  Subjective:      CC:  Chief Complaint  Patient presents with   Acute Visit    Reports increased shortness of breath with exertion. Denies chest tightness, wheezing or coughing.    HPI: Julie Duncan is a 83 y.o. female presenting on 03/16/2024 for Acute Visit (Reports increased shortness of breath with exertion. Denies chest tightness, wheezing or coughing.) .  Discussed the use of AI scribe software for clinical note transcription with the patient, who gave verbal consent to proceed.  History of Present Illness Julie Duncan is an 83 year old female with hypertension and atrial fibrillation who presents with fatigue and loss of appetite.  She experiences significant fatigue and a loss of appetite, describing herself as 'sleepy all the time' and noting a weight loss of five to six pounds since her last visit. Her meals are minimal, often just half a sandwich in the morning with little intake later in the day.  She has a history of atrial fibrillation and underwent an ablation in 04/08/2022. She is currently on aspirin  and rosuvastatin  10 mg. Her last EKG showed sinus rhythm with sinus arrhythmia, PVCs, and a left axis deviation. No obstructive coronary artery disease was reported, with a catheterization showing 50% mid LAD stenosis.  She is dissatisfied with the medication and feels it may be related to her fatigue. She has recently started taking half the dose. Her blood pressure was recorded as 156/70 today, compared to 130/80 in July.  She has a history of mixed dyslipidemia with a last documented LDL of 135. She has not seen her cardiologist recently and has not completed a previously recommended sleep study to rule out sleep apnea.  Socially, [place] and describes feeling isolated. Her husband passed away in 04/08/2012, and she has limited interaction with her daughter who lives nearby. She used to be very active but now finds  herself less engaged in activities she once enjoyed, such as dancing and shopping.     Wt Readings from Last 3 Encounters:  03/16/24 176 lb 9.6 oz (80.1 kg)  12/29/23 177 lb (80.3 kg)  11/18/23 180 lb 12.8 oz (82 kg)        Social history:  Relevant past medical, surgical, family and social history reviewed and updated as indicated. Interim medical history since our last visit reviewed.  Allergies and medications reviewed and updated.  DATA REVIEWED: CHART IN EPIC     ROS: Negative unless specifically indicated above in HPI.    Current Outpatient Medications:    ascorbic acid (VITAMIN C) 500 MG tablet, Take 500 mg by mouth daily., Disp: , Rfl:    aspirin  EC 81 MG tablet, Take 1 tablet (81 mg total) by mouth daily. Swallow whole., Disp: 90 tablet, Rfl: 3   Cholecalciferol (VITAMIN D PO), Take 1 tablet by mouth daily., Disp: , Rfl:    Coenzyme Q10 10 MG capsule, Take 10 mg by mouth daily., Disp: , Rfl:    cyanocobalamin  50 MCG tablet, Take 1 tablet by mouth daily., Disp: , Rfl:    Ginkgo Biloba 40 MG TABS, Take 1 tablet by mouth daily., Disp: , Rfl:    losartan  (COZAAR ) 50 MG tablet, Take 1 tablet by mouth twice daily, Disp: 180 tablet, Rfl: 2   VITAMIN E PO, Take 1 tablet by mouth daily., Disp: , Rfl:         Objective:  BP (!) 156/78 (BP Location: Left Arm, Patient Position: Sitting, Cuff Size: Large)   Pulse 75   Temp 98.9 F (37.2 C) (Temporal)   Ht 5' 3 (1.6 m)   Wt 176 lb 9.6 oz (80.1 kg)   SpO2 98%   BMI 31.28 kg/m   Physical Exam VITALS: BP- 156/70  Wt Readings from Last 3 Encounters:  03/16/24 176 lb 9.6 oz (80.1 kg)  12/29/23 177 lb (80.3 kg)  11/18/23 180 lb 12.8 oz (82 kg)    Physical Exam Constitutional:      General: She is not in acute distress.    Appearance: Normal appearance. She is normal weight. She is not ill-appearing, toxic-appearing or diaphoretic.  HENT:     Head: Normocephalic.  Cardiovascular:     Rate and  Rhythm: Normal rate.  Pulmonary:     Effort: Pulmonary effort is normal.  Musculoskeletal:        General: Normal range of motion.     Right lower leg: 1+ Pitting Edema present.     Left lower leg: 1+ Pitting Edema present.  Neurological:     General: No focal deficit present.     Mental Status: She is alert and oriented to person, place, and time. Mental status is at baseline.  Psychiatric:        Mood and Affect: Mood normal.        Behavior: Behavior normal.        Thought Content: Thought content normal.        Judgment: Judgment normal.          Results DIAGNOSTIC EKG: Sinus rhythm with sinus arrhythmia, rate 89, PVCs, left axis deviation (10/2023) Cardiac catheterization: 50% mid LAD stenosis (10/2023) Echocardiogram: Low normal ejection fraction, no pericardial effusion, ejection fraction 50-55% (10/2023)  Assessment & Plan:   Assessment and Plan Assessment & Plan Fatigue and excessive daytime sleepiness, evaluation for sleep apnea Chronic fatigue and excessive daytime sleepiness, potentially related to undiagnosed sleep apnea. Reports feeling sleepy throughout the day despite adequate nighttime sleep. Atrial fibrillation history, which can be exacerbated by sleep apnea. Has not yet completed a sleep study, which was previously recommended. - Order home sleep study to evaluate for sleep apnea - Encourage completion of sleep study to rule out sleep apnea as a contributing factor to fatigue -consider remeron for possible depression   Atrial fibrillation, status post ablation Atrial fibrillation, status post ablation in 2023. Currently on aspirin  therapy. Emphasizes managing potential contributing factors such as sleep apnea. - Continue aspirin  therapy - Evaluate for sleep apnea as it may contribute to atrial fibrillation  Unintentional weight loss and decreased appetite Reports decreased appetite and unintentional weight loss of approximately 4 pounds since May.  Attributes this to blood pressure medication, but other underlying causes are suspected. No gastrointestinal symptoms reported. - Monitor weight and appetite  Hypertension Hypertension managed with losartan  50 mg twice daily. Reports dissatisfaction with the medication, attributing it to fatigue and decreased appetite. Blood pressure readings have been variable, with a recent reading of 156/70 mmHg on a reduced dose. - Continue losartan  50 mg twice daily - Encourage home blood pressure monitoring and maintain a blood pressure log - Communicate with cardiologist (Dr. Mady) regarding concerns about medication - Consider follow-up with cardiologist to discuss medication management  Peripheral edema Reports peripheral edema, particularly in the ankles. Attributes some swelling to arthritis. - Elevate legs to reduce swelling  Hyperlipidemia Mixed hyperlipidemia with a last documented LDL of 135 mg/dL.  Managed with rosuvastatin  10 mg daily. - Continue rosuvastatin  10 mg daily  Osteoarthritis of right ankle, status post left ankle replacement Chronic osteoarthritis in the right ankle with a history of left ankle replacement. Reports swelling and discomfort in the right ankle, which is bone on bone and may require future surgical intervention. - Monitor symptoms        Return in about 1 month (around 04/15/2024).     Ginger Patrick, MSN, APRN, FNP-C [facility]

## 2024-03-16 NOTE — Telephone Encounter (Signed)
 Dr. Mady   This is non emergent so can wait until you are back. [facility] patient here with me and she continues to insist that her losartan  is what is causing her fatigue. I advised her I do not agree that this is causing the fatigue but she is persistent. I have been trying to push a sleep study to r/o OSA but she declines, and also from your note declined loop recorder etc.   Is there any way you could alternate her med so that she can rule out that this is the not the blood pressure medication? I know losartan  is probably best suited for her but I do not think she is going to stop thinking it is the medication unless we show her it is not. Maybe another ARB? I wanted your input prior to changing.

## 2024-03-17 ENCOUNTER — Ambulatory Visit: Admitting: Family Medicine

## 2024-03-19 ENCOUNTER — Ambulatory Visit: Payer: Self-pay | Admitting: Family

## 2024-03-19 DIAGNOSIS — E876 Hypokalemia: Secondary | ICD-10-CM

## 2024-03-19 DIAGNOSIS — D509 Iron deficiency anemia, unspecified: Secondary | ICD-10-CM

## 2024-03-22 NOTE — Telephone Encounter (Signed)
 I also agree, I do feel it is more towards her IDA as well as possible sleep apnea (I am trying to get her to agree to a sleep study) but she is persistent about this hydrochlorothiazide . I hate to change her when it has been doing well with her. I think for now I will leave it as is and try to continue to address the other concerns, and I appreciate your input.

## 2024-03-22 NOTE — Telephone Encounter (Signed)
 Thanks for your message.  It seems like a bit of a stretch that losartan  would be driving all of her symptoms, particularly since she has been on it for more than 15 years.  If her blood pressure has not been consistently elevated, we could try stopping it and seeing if she notices any improvement in her symptoms with close BP monitoring.  Alternatively, you could try switching her to something like HCTZ.  Unfortunately, it looks like she has an allergy to amlodipine , which would be an alternative choice.  Please let me know if any other questions or concerns come up.  [place]

## 2024-03-22 NOTE — Progress Notes (Signed)
 B12 is a bit high, is she taking oral b12?  Thyroid  normal range.  Mildly anemic which is new for her, any notice of blood in the stool at all?  I know she has mentioned increased fatigue.   Kidney function slightly lower than normal, is she drinking a good amount of water daily?  4-5 glasses 16 oz water at least?  Complaining of any urinary symptoms, urgency frequency burning?  Can we have her come in and repeat labs as soon as able , potassium was low and I want to repeat it to make sure it was a good reading and also get her to have a stool card to assess for blood in the stool as she is on a daily aspirin .

## 2024-03-28 ENCOUNTER — Telehealth: Payer: Self-pay

## 2024-03-28 ENCOUNTER — Other Ambulatory Visit (INDEPENDENT_AMBULATORY_CARE_PROVIDER_SITE_OTHER)

## 2024-03-28 DIAGNOSIS — D509 Iron deficiency anemia, unspecified: Secondary | ICD-10-CM

## 2024-03-28 DIAGNOSIS — E876 Hypokalemia: Secondary | ICD-10-CM

## 2024-03-28 LAB — BASIC METABOLIC PANEL WITH GFR
BUN: 14 mg/dL (ref 6–23)
CO2: 29 meq/L (ref 19–32)
Calcium: 9.1 mg/dL (ref 8.4–10.5)
Chloride: 103 meq/L (ref 96–112)
Creatinine, Ser: 1.1 mg/dL (ref 0.40–1.20)
GFR: 45.83 mL/min — ABNORMAL LOW (ref >60.00)
Glucose, Bld: 145 mg/dL — ABNORMAL HIGH (ref 70–99)
Potassium: 2.8 meq/L — CL (ref 3.5–5.1)
Sodium: 144 meq/L (ref 135–145)

## 2024-03-28 LAB — CBC
HCT: 32.9 % — ABNORMAL LOW (ref 36.0–46.0)
Hemoglobin: 11 g/dL — ABNORMAL LOW (ref 12.0–15.0)
MCHC: 33.4 g/dL (ref 30.0–36.0)
MCV: 86.3 fl (ref 78.0–100.0)
Platelets: 355 K/uL (ref 150.0–400.0)
RBC: 3.81 Mil/uL — ABNORMAL LOW (ref 3.87–5.11)
RDW: 15.1 % (ref 11.5–15.5)
WBC: 5.8 K/uL (ref 4.0–10.5)

## 2024-03-28 LAB — IBC + FERRITIN
Ferritin: 58 ng/mL (ref 10.0–291.0)
Iron: 49 ug/dL (ref 42–145)
Saturation Ratios: 21.1 % (ref 20.0–50.0)
TIBC: 232.4 ug/dL — ABNORMAL LOW (ref 250.0–450.0)
Transferrin: 166 mg/dL — ABNORMAL LOW (ref 212.0–360.0)

## 2024-03-28 MED ORDER — POTASSIUM CHLORIDE CRYS ER 20 MEQ PO TBCR: EXTENDED_RELEASE_TABLET | ORAL | 0 refills | Status: DC

## 2024-03-28 MED ORDER — POTASSIUM CHLORIDE CRYS ER 20 MEQ PO TBCR: 20.0000 meq | EXTENDED_RELEASE_TABLET | Freq: Every day | ORAL | 0 refills | Status: DC

## 2024-03-28 NOTE — Telephone Encounter (Signed)
 Patient notified of lab results. Patient has been scheduled for lab appointments only.

## 2024-03-28 NOTE — Addendum Note (Signed)
 Addended by: [name] on: 03/28/2024 05:13 PM   Modules accepted: Orders

## 2024-03-28 NOTE — Telephone Encounter (Addendum)
 Received call from [facility], of [facility], with the following critical labs:  Potassium 2.8.  Ordered by Ginger (has left the office). Will fwd to Dr Cleatus in Tabitha's absence.

## 2024-03-28 NOTE — Telephone Encounter (Signed)
 Please call pt.  Potassium is low.  Would take potassium 2 tabs daily for 2 days then 1 daily thereafter.  Would recheck BMET next week.  I put in the follow up orders and please schedule lab visit.  Thanks.   Routed to [place] as FYI.

## 2024-03-29 ENCOUNTER — Ambulatory Visit: Payer: Self-pay | Admitting: Family

## 2024-03-29 ENCOUNTER — Other Ambulatory Visit

## 2024-03-29 ENCOUNTER — Other Ambulatory Visit: Payer: Self-pay

## 2024-03-29 DIAGNOSIS — D509 Iron deficiency anemia, unspecified: Secondary | ICD-10-CM

## 2024-03-29 DIAGNOSIS — D649 Anemia, unspecified: Secondary | ICD-10-CM

## 2024-03-29 NOTE — Telephone Encounter (Signed)
 NOTED

## 2024-03-30 LAB — FECAL OCCULT BLOOD, IMMUNOCHEMICAL: Fecal Occult Bld: POSITIVE — AB

## 2024-03-31 ENCOUNTER — Encounter: Payer: Self-pay | Admitting: *Deleted

## 2024-03-31 ENCOUNTER — Ambulatory Visit: Attending: Nurse Practitioner | Admitting: Nurse Practitioner

## 2024-03-31 ENCOUNTER — Encounter: Payer: Self-pay | Admitting: Nurse Practitioner

## 2024-03-31 VITALS — BP 162/82 | HR 81 | Wt 177.0 lb

## 2024-03-31 DIAGNOSIS — I2510 Atherosclerotic heart disease of native coronary artery without angina pectoris: Secondary | ICD-10-CM

## 2024-03-31 DIAGNOSIS — I10 Essential (primary) hypertension: Secondary | ICD-10-CM | POA: Diagnosis not present

## 2024-03-31 DIAGNOSIS — D649 Anemia, unspecified: Secondary | ICD-10-CM | POA: Diagnosis not present

## 2024-03-31 DIAGNOSIS — I480 Paroxysmal atrial fibrillation: Secondary | ICD-10-CM

## 2024-03-31 DIAGNOSIS — E782 Mixed hyperlipidemia: Secondary | ICD-10-CM

## 2024-03-31 DIAGNOSIS — I471 Supraventricular tachycardia, unspecified: Secondary | ICD-10-CM

## 2024-03-31 DIAGNOSIS — I483 Typical atrial flutter: Secondary | ICD-10-CM

## 2024-03-31 DIAGNOSIS — E876 Hypokalemia: Secondary | ICD-10-CM

## 2024-03-31 DIAGNOSIS — I441 Atrioventricular block, second degree: Secondary | ICD-10-CM

## 2024-03-31 MED ORDER — SPIRONOLACTONE 25 MG PO TABS: 25.0000 mg | ORAL_TABLET | Freq: Every day | ORAL | 3 refills | Status: DC

## 2024-03-31 NOTE — Progress Notes (Signed)
 Office Visit    Patient Name: [facility] Date of Encounter: 03/31/2024  Primary Care Provider:  [name], MD Primary Cardiologist:  [name], MD  Electrophysiologist:  [name], MD   Chief Complaint    83 y.o. female with a history of atrial myxoma s/p resection at [facility] in April 2016, paroxysmal atrial fibrillation on Xarelto , atrial flutter, PSVT/AVNRT status post catheter ablation in February 2022, atrial flutter status post catheter ablation in December 2023, nonobstructive CAD, Mobitz 1 heart block, hypertension, hyperlipidemia, and arthritis, presents for follow-up related to atrial fibrillation.  Past Medical History   Subjective   Past Medical History:  Diagnosis Date   Arthritis    hands and ankles   Atrial flutter (HCC)    a. 05/2022 s/p catheter ablation.   Atrial myxoma    a. 2016 s/p resection ([facility]); b. 04/2017 Echo: EF 60-65%, no rwma, mild AI/MR. Mild-mod TR. Nl PASP; c. 07/2021 Echo: EF 60-65%, no rwma, nl RV fxn, mild MR/AI. Ao sclerosis; d. 06/2022 Echo: EF 50-55%, mild LVH, nl RV fxn, mild MR/MVP. Mild AI.   Coronary artery disease 03/27/2022   a. 03/2022 Cath: LM nl, LAD 83m (nl iFR - 1.0), LCX nl, RCA nl.   Excessive daytime sleepiness    History of stress test    a. 04/2017 ETT: Poor ex capacity. No ischemia. Episode of PSVT noted.   Hyperlipidemia    Hypertension    Interstitial lung disease (HCC)    Mild aortic regurgitation    Mild mitral regurgitation    Mobitz I    Mobitz II    Paroxysmal atrial fibrillation (HCC)    a. CHA2DS2VASc = 4.  Did not tolerate Warfarin. Difficulty in affording DOAC. Eliquis  added 07/2021 (new Aflutter).   Paroxysmal SVT (supraventricular tachycardia)    a. 06/2020 Zio: 3813 SVT runs - longest 66m 47s, max rate 231; b. 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   Wears dentures    partial upper and lower.  no longer has lower   Past Surgical History:  Procedure Laterality Date   ANKLE SURGERY Left     ATRIAL FIBRILLATION ABLATION N/A 06/05/2022   Procedure: ATRIAL FIBRILLATION ABLATION;  Surgeon: HOLTS OLE ONEIDA, MD;  Location: [facility];  Service: Cardiovascular;  Laterality: N/A;   CARDIOVERSION N/A 08/26/2021   Procedure: CARDIOVERSION;  Surgeon: Hanson Lonni, MD;  Location: [facility];  Service: Cardiovascular;  Laterality: N/A;   CATARACT EXTRACTION W/PHACO Left 09/30/2023   Procedure: PHACOEMULSIFICATION, CATARACT, WITH IOL INSERTION 13.77 01:16.2;  Surgeon: [name], MD;  Location: [facility];  Service: Ophthalmology;  Laterality: Left;   CATARACT EXTRACTION W/PHACO Right 10/14/2023   Procedure: PHACOEMULSIFICATION, CATARACT, WITH IOL INSERTION 13.84, 01:11.3;  Surgeon: [name], MD;  Location: [facility];  Service: Ophthalmology;  Laterality: Right;   CORONARY PRESSURE/FFR STUDY N/A 03/27/2022   Procedure: INTRAVASCULAR PRESSURE WIRE/FFR STUDY;  Surgeon: Hanson Lonni, MD;  Location: [facility];  Service: Cardiovascular;  Laterality: N/A;   EXCISION OF ATRIAL MYXOMA     LEFT HEART CATH AND CORONARY ANGIOGRAPHY N/A 03/27/2022   Procedure: LEFT HEART CATH AND CORONARY ANGIOGRAPHY;  Surgeon: Hanson Lonni, MD;  Location: [facility];  Service: Cardiovascular;  Laterality: N/A;   PARTIAL HYSTERECTOMY     SVT ABLATION N/A 07/30/2020   Procedure: SVT ABLATION;  Surgeon: [name], MD;  Location: [facility];  Service: Cardiovascular;  Laterality:  N/A;   WRIST SURGERY Right     Allergies  Allergies  Allergen Reactions   Cortisone Other (See Comments)    Patient states cortisone injections have messes with my heart.  Has had cortisone shots and each time causes heart rhythm problems.   Amlodipine  Itching   Calcium -Containing Compounds     Shoulder pain   Red Yeast Rice  [Cholestin] Other (See Comments)   Saccharin Other (See Comments)    Kidney infections   Warfarin And Related      anorexia       History of Present Illness      83 y.o. y/o female with the above past medical history including atrial myxoma, paroxysmal atrial fibrillation, PSVT/AVNRT, Mobitz 1 heart block, hypertension, hyperlipidemia, and arthritis.  She previously underwent resection of atrial [facility]xoma [facility] in [facility].  Following resection, she noted worsening of palpitations and fatigue, which was initially managed with atenolol therapy, and subsequently switched to diltiazem, which caused headaches, and finally, switched to metoprolol .  She was on warfarin for a period of time in the setting of paroxysmal atrial fibrillation following myxoma resection.  She established care in our office November 2017.  Exercise treadmill testing in November 2018 was notable for poor exercise tolerance, an episode of PSVT, and no evidence of ischemia.  Echocardiogram in November 2018 showed an EF of 60 to 65% without regional wall motion abnormalities, mild AI/MR, and mild to moderate TR.  In early 2022, in the setting of worsening palpitations, she wore an event monitor which showed 3813 runs of SVT.  Mobitz 1 AV block was also noted.  She underwent successful catheter ablation for AVNRT in February 2022.  Event monitor was placed following the procedure was notable for 135 episodes of AV block including Mobitz 1 and Mobitz 2.  Wenckebach was detected within +/- 45 seconds of symptomatic events.  Episodes of paroxysmal atrial fibrillation could not be excluded and she was placed on Eliquis  however, this was subsequently discontinued due to cost and she preferred to take aspirin .   In February 2023, she was seen in clinic with complaints of fatigue and dyspnea, was found to be in 4:1 atrial flutter and was placed on Eliquis .  Echocardiogram in February 2023 showed an EF of 60-65% with mild MR/AI, and aortic sclerosis.  She was seen by electrophysiology recommendation for catheter ablation.  Prior to the procedure however, she  experienced dyspnea on exertion concerning for anginal equivalent.  In October 2023, she underwent diagnostic catheterization which revealed a 50% stenosis in the mid LAD but otherwise normal coronary arteries and normal LVEDP, and she was medically managed.  She subsequently underwent atrial flutter ablation in December 2023.  Upon postprocedure follow-up in January 2024, she was tachycardic with soft blood pressure.  A limited echocardiogram was performed and showed normal LV function with mild mitral regurgitation, mild mitral valve prolapse, and mild AI.  No pericardial effusion was noted.    On her own accord, she discontinued Eliquis  in March 2024.  Electrophysiology recommended implantable loop recorder to assist in decision-making around oral anticoagulation however, patient declined.  AV nodal blocking agents have been avoided in the setting of history of Mobitz 1 and Mobitz 2 heart block.     Ms. Harrington was last seen in cardiology clinic in May 2025, at which time she reported some increase in palpitations.  EKG that day showed sinus rhythm with PVCs.  She was not interested in event monitoring or reconsidering implantable  loop recorder.  Today she reports chronic fatigue and daytime sleepiness, that has been present for many years.  She has previously declined sleep study.  In addition to chronic symptoms, over the past several months, she has noted progressive dyspnea on exertion, worsening fatigue, and poor appetite.  She notes chronic mild lower extremity edema.  She tries to stay busy but more frequently has to stop and rest.  She also takes a daily nap, which she has for many years.  She does not experience chest pain but has noted more frequent palpitations.  She was recently seen by her primary care due to concerns, which she primarily attributed to her losartan  despite being on it for many years.  Recent lab work was notable for hypokalemia as well as mild anemia with an H&H of 11 and 32.9  respectively.  In the setting of anemia, she was provided stool cards which returned positive, and she is now pending GI evaluation.  She is in atrial fibrillation today and is rate controlled at 81 bpm.  She denies PND, orthopnea, dizziness, syncope, or early satiety.   Objective   Home Medications    Current Outpatient Medications  Medication Sig Dispense Refill   ascorbic acid (VITAMIN C) 500 MG tablet Take 500 mg by mouth daily.     aspirin  EC 81 MG tablet Take 1 tablet (81 mg total) by mouth daily. Swallow whole. 90 tablet 3   Cholecalciferol (VITAMIN D PO) Take 1 tablet by mouth daily.     Coenzyme Q10 10 MG capsule Take 10 mg by mouth daily.     Ginkgo Biloba 40 MG TABS Take 1 tablet by mouth daily.     losartan  (COZAAR ) 50 MG tablet Take 1 tablet by mouth twice daily 180 tablet 2   potassium chloride SA (KLOR-CON M) 20 MEQ tablet 2 tabs a day for 2 days then 1 tab a day. 30 tablet 0   spironolactone (ALDACTONE) 25 MG tablet Take 1 tablet (25 mg total) by mouth daily. 90 tablet 3   VITAMIN E PO Take 1 tablet by mouth daily.     cyanocobalamin  50 MCG tablet Take 1 tablet by mouth daily. (Patient not taking: Reported on 03/31/2024)     No current facility-administered medications for this visit.     Physical Exam    VS:  BP (!) 158/86 (BP Location: Right Arm, Patient Position: Sitting, Cuff Size: Large)   Pulse 81   Wt 177 lb (80.3 kg)   SpO2 98%   BMI 31.35 kg/m  , BMI Body mass index is 31.35 kg/m.        Vitals:   03/31/24 1044 03/31/24 1240  BP: (!) 158/86 (!) 162/82  Pulse: 81   SpO2:        GEN: Well nourished, well developed, in no acute distress. HEENT: normal. Neck: Supple, no JVD, carotid bruits, or masses. Cardiac: Irregularly irregular, no murmurs, rubs, or gallops. No clubbing, cyanosis.  1+ bilateral lower extremity edema.  Radials 2+/PT 2+ and equal bilaterally.  Respiratory:  Respirations regular and unlabored, bibasilar crackles. GI: Soft, nontender,  nondistended, BS + x 4. MS: no deformity or atrophy. Skin: warm and dry, no rash. Neuro:  Strength and sensation are intact. Psych: Normal affect.  Accessory Clinical Findings    ECG personally reviewed by me today - EKG Interpretation Date/Time:  Friday March 31 2024 10:48:50 EDT Ventricular Rate:  81 PR Interval:    QRS Duration:  94 QT Interval:  396 QTC Calculation: 460 R Axis:   -26  Text Interpretation: Atrial fibrillation Nonspecific T wave abnormality Confirmed by Vivienne Bruckner [phone]) on 03/31/2024 10:54:50 AM  - no acute changes.  Lab Results  Component Value Date   WBC 5.8 03/28/2024   HGB 11.0 (L) 03/28/2024   HCT 32.9 (L) 03/28/2024   MCV 86.3 03/28/2024   PLT 355.0 03/28/2024   Lab Results  Component Value Date   CREATININE 1.10 03/28/2024   BUN 14 03/28/2024   NA 144 03/28/2024   K 2.8 (LL) 03/28/2024   CL 103 03/28/2024   CO2 29 03/28/2024   Lab Results  Component Value Date   ALT 9 12/29/2023   AST 15 12/29/2023   ALKPHOS 139 (H) 12/29/2023   BILITOT 0.6 12/29/2023   Lab Results  Component Value Date   CHOL 244 (H) 12/29/2023   HDL 49.00 12/29/2023   LDLCALC 156 (H) 12/29/2023   LDLDIRECT 183.0 06/17/2010   TRIG 194.0 (H) 12/29/2023   CHOLHDL 5 12/29/2023    Lab Results  Component Value Date   HGBA1C 6.2 09/02/2023   Lab Results  Component Value Date   TSH 1.33 03/16/2024       Assessment & Plan    1.  Paroxysmal atrial fibrillation/history of atrial flutter: Patient with prior history of PAF, AVNRT, and atrial flutter, status post catheter ablation for the latter two, most recently flutter ablation in December 2023.  She has been off of anticoagulation since March 2024.  Over the past several months, she has noted progressive dyspnea on exertion, worsening of baseline level of fatigue, and palpitations.  She is in atrial fibrillation at 81 bpm today.  She was not aware of this.  We discussed resumption of oral anticoagulation  however, patient was recently found to be mildly anemic with positive stool cards earlier this week, and is now pending GI evaluation.  She denies any bright red blood per rectum or melena.  We collectively agreed to hold off on oral anticoagulation until cleared by GI.  Has rate is currently stable and as she has a prior history of Mobitz 1 and 2, I will continue to avoid AV nodal blocking agent.  Once cleared by GI, we would plan to add oral anticoagulation and following 3 weeks of anticoagulation, would likely pursue cardioversion, though in the long run, she will need electrophysiology input for consideration of antiarrhythmics versus catheter ablation.  Follow-up echocardiogram.  I will also arrange for follow-up lab work in 1 week in the setting of hypokalemia recently noted through primary care office (she is now on potassium supplement).  2.  Acute HFpEF:  EF 50-55% by echo in 06/2022.  As noted above, she has been having worsening dyspnea exertion and early satiety.  She has 1+ bilateral lower extremity edema, which she says is chronic.  Bibasilar crackles on exam.  I suspect some of her dyspnea is secondary to mild volume overload in the setting of loss of atrial kick.  We discussed potentially adding a loop diuretic but w/ recent hypokalemia (K 2.8 earlier this week) and poor mobility (concerned about having to run to the bathroom), we elected to avoid and instead I have added spironolactone 25 mg daily.  F/u echo.  3.  Primary HTN: BP elevated.  She agreed to continue her losartan  at 50 mg twice daily.  Adding spiro 25 mg daily.  BMET/Mg next week.  4.  PSVT/AVNRT: Status post prior catheter ablation.  No AV nodal blocking  agents in the setting of history of Mobitz 1/2 heart block.  5.  Mobitz I/II heart block: Previously noted on monitoring March 2022.  Continue to avoid AV nodal blocking agents.  Thankfully, A-fib is well rate controlled.  6.  Nonobstructive CAD: Prior catheterization in  October 2024 showed moderate, nonobstructive LAD disease and otherwise normal coronary arteries.  She is not experiencing chest pain.  Plan to follow-up echo in the setting of above.  She is currently on aspirin .  7.  Hyperlipidemia: LDL of 156 in July 2025.  Previously on rosuvastatin  but she discontinued prior to most recent lab check.  With the addition of spironolactone and her concern regarding potential side effects, we will hold off on adding a statin at this time we will need to readdress.  8.  Hypokalemia: Potassium was 2.8 on October 7 and she is now on potassium supplement, 20 mg once daily.  Adding spironolactone.  Follow-up lab work next week.  9.  Normocytic anemia: Recent H&H of 11.0 and 32.9.  Labs were evaluated due to fatigue and dyspnea.  Normal serum iron and ferritin.  Fecal occult blood positive stool.  Patient denies melena or bright red blood per rectum.  As above, holding off on oral anticoagulation.  Primary care plans to send to GI.  10.  Disposition: Follow-up basic metabolic panel and magnesium in 1 week.  Follow-up in clinic in approximately 3 weeks to reevaluate.  Lonni Meager, NP 03/31/2024, 11:37 AM

## 2024-03-31 NOTE — Patient Instructions (Addendum)
 Medication Instructions:   Your physician recommends the following medication changes.  START TAKING: Spirolactone 25 mg once daily   *If you need a refill on your cardiac medications before your next appointment, please call your pharmacy*  Lab Work:  Your provider would like for you to return in 1 Week to have the following labs drawn: BMet, Mag.   Please go to [facility] [address] ([facility] [address], [place] [address] You do not need an appointment.  They are open from 8 am- 4:30 pm.  Lunch from 1:00 pm- 2:00 pm You DO NOT need to be fasting.   You may also go to one of the following [facility]:  [address] [place], [place] [address] Phone: [phone] Lab hours: Mon-Fri 8 am- 5 pm    Lunch 12 pm- 1 pm  [address] [place],  [place]  [address]  [place]  Phone: [phone] Lab hours: 7 am- 4 pm Lunch 12 pm-1 pm   [address] [place],  [place]  [address]  [place]  Phone: [phone] Lab hours: Mon-Fri 8 am- 5 pm    Lunch 12 pm- 1 pm   If you have labs (blood work) drawn today and your tests are completely normal, you will receive your results only by:  MyChart Message (if you have MyChart) OR  A paper copy in the mail If you have any lab test that is abnormal or we need to change your treatment, we will call you to review the results.  Testing/Procedures:  Your physician has requested that you have an echocardiogram. Echocardiography is a painless test that uses sound waves to create images of your heart. It provides your doctor with information about the size and shape of your heart and how well your heart's chambers and valves are working.   You may receive an ultrasound enhancing agent through an IV if needed to better visualize your heart during the echo. This procedure takes approximately one hour.  There are no restrictions for this procedure.  This will take place at [address] [facility]) [address], [place]  [address]  Please note: We ask at that you not bring children with you during ultrasound (echo/ vascular) testing. Due to room size and safety concerns, children are not allowed in the ultrasound rooms during exams. Our front office staff cannot provide observation of children in our lobby area while testing is being conducted. An adult accompanying a patient to their appointment will only be allowed in the ultrasound room at the discretion of the ultrasound technician under special circumstances. We apologize for any inconvenience.   Referrals:  None ordered at this time   Follow-Up:  At [facility], you and your health needs are our priority.  As part of our continuing mission to provide you with exceptional heart care, our providers are all part of one team.  This team includes your primary Cardiologist (physician) and Advanced Practice Providers or APPs (Physician Assistants and Nurse Practitioners) who all work together to provide you with the care you need, when you need it.  Your next appointment:   3 week(s)  Provider:    Ole Holts, MD or Suzann Riddle, NP    We recommend signing up for the patient portal called MyChart.  Sign up information is provided on this After Visit Summary.  MyChart is used to connect with patients for Virtual Visits (Telemedicine).  Patients are able to view lab/test results, encounter notes, upcoming appointments, etc.  Non-urgent  messages can be sent to your provider as well.   To learn more about what you can do with MyChart, go to [email or web address].

## 2024-04-03 ENCOUNTER — Encounter: Payer: Self-pay | Admitting: Physician Assistant

## 2024-04-03 ENCOUNTER — Telehealth: Payer: Self-pay

## 2024-04-03 ENCOUNTER — Other Ambulatory Visit: Payer: Self-pay | Admitting: *Deleted

## 2024-04-03 DIAGNOSIS — D649 Anemia, unspecified: Secondary | ICD-10-CM

## 2024-04-03 DIAGNOSIS — K921 Melena: Secondary | ICD-10-CM

## 2024-04-03 NOTE — Telephone Encounter (Signed)
 Can you please re-route this referral to [place]?  Thanks.

## 2024-04-03 NOTE — Telephone Encounter (Signed)
 Copied from CRM [id number]. Topic: General - Other >> Apr 03, 2024 10:33 AM Alfonso HERO wrote: Reason for CRM: patient wants to see if you can refer her to a GI location in [place] because the other places are too far.[email or web address]

## 2024-04-03 NOTE — Telephone Encounter (Signed)
 Can we re route this referral to [facility] per pt preference? (GI referral)

## 2024-04-04 ENCOUNTER — Telehealth: Payer: Self-pay | Admitting: Internal Medicine

## 2024-04-04 NOTE — Telephone Encounter (Signed)
 Patient c/o Palpitations:  STAT if patient reporting lightheadedness, shortness of breath, or chest pain  How long have you had palpitations/irregular HR/ Afib? Are you having the symptoms now? Last week/Yes  Are you currently experiencing lightheadedness, SOB or CP? Yes  Do you have a history of afib (atrial fibrillation) or irregular heart rhythm? Yes  Have you checked your BP or HR? (document readings if available): No  Are you experiencing any other symptoms? Not able to eat  Call transferred

## 2024-04-04 NOTE — Telephone Encounter (Addendum)
 Received a call on that was transferred to STAT phone; Pt states that she can barely function due to her AFIB and fast heart rate, yet she is driving down the road and the conversation is cutting in and out; she states that she was seen in our office last week for rapid heart rate, shortness of breath and wasn't given anything to help her.however, looking over her AVS, Medford Meager, NP prescribed her Potassium 40 meq x 2 days and then continue with 20 meq daily because her Potassium was 2.8; he also ordered her  along with Spironolactone to help prevent increased swelling while sparing her Potassium.  She stated if you say so when I went over her after visit summary.   She is complaining of the same symptoms today that she came in to clinic for on 10/10.  I asked pt if she had checked her heart rate and pt states she has no way of checking it.  Pt states that she wants Atenolol.  She states she has been dealing with her heart issues for 40 years and Atenolol worked.  I explained to her that I would reach out to Medford Meager, NP and Chantal Needle, NP for any further recommendations.  Pt has a follow up appointment with Suzann Riddle, NP to follow up on her AFIB on 10/31 and she has an echocardiogram scheduled on 11/24.

## 2024-04-04 NOTE — Telephone Encounter (Signed)
 HR was well controlled at office visit. If she has become more tachycardic or symptomatic, I recommend ED evaluation.  With complex history of heart block and more recent anemia and FOB positive stool, mgmt of Afib is complicated at this time. If symptoms have worsened admission may be appropriate to work up the multiple issues and more quickly address Afib mgmt.

## 2024-04-04 NOTE — Telephone Encounter (Signed)
 Will defer to NP [facility] for recommendations. I have not seen this patient.

## 2024-04-05 ENCOUNTER — Other Ambulatory Visit (INDEPENDENT_AMBULATORY_CARE_PROVIDER_SITE_OTHER)

## 2024-04-05 DIAGNOSIS — E876 Hypokalemia: Secondary | ICD-10-CM

## 2024-04-05 DIAGNOSIS — D649 Anemia, unspecified: Secondary | ICD-10-CM

## 2024-04-05 LAB — CBC
HCT: 34.2 % — ABNORMAL LOW (ref 36.0–46.0)
Hemoglobin: 11.1 g/dL — ABNORMAL LOW (ref 12.0–15.0)
MCHC: 32.3 g/dL (ref 30.0–36.0)
MCV: 87.8 fl (ref 78.0–100.0)
Platelets: 343 K/uL (ref 150.0–400.0)
RBC: 3.9 Mil/uL (ref 3.87–5.11)
RDW: 15.3 % (ref 11.5–15.5)
WBC: 5.8 K/uL (ref 4.0–10.5)

## 2024-04-05 LAB — BASIC METABOLIC PANEL WITH GFR
BUN: 17 mg/dL (ref 6–23)
CO2: 30 meq/L (ref 19–32)
Calcium: 9 mg/dL (ref 8.4–10.5)
Chloride: 104 meq/L (ref 96–112)
Creatinine, Ser: 1.17 mg/dL (ref 0.40–1.20)
GFR: 42.56 mL/min — ABNORMAL LOW (ref >60.00)
Glucose, Bld: 121 mg/dL — ABNORMAL HIGH (ref 70–99)
Potassium: 3.7 meq/L (ref 3.5–5.1)
Sodium: 144 meq/L (ref 135–145)

## 2024-04-05 NOTE — Telephone Encounter (Signed)
 Returned the call to the patient. She stated that she is feeling somewhat better. She stated that the symptoms started when she began the potassium  on 10/8 and spironolactone  on 10/10. Once she started the spironolactone, the symptoms progressed.  She stated that she wants to stop the potassium and spironolactone feeling like this is the problem.  She denies swelling but does have shortness of breath on exertion. She stated that she gets tired when she tries to ambulate. She has been advised to go to the ED for further evaluation but she has declined. She wants to stop the potassium and spironolactone first and she how she feels tomorrow. She has been advised to reach out to PCP about her potassium.   She has stated that if she does not feel better tomorrow, she will have her neighbors take her to the ED.

## 2024-04-06 DIAGNOSIS — M19071 Primary osteoarthritis, right ankle and foot: Secondary | ICD-10-CM | POA: Diagnosis not present

## 2024-04-06 DIAGNOSIS — Z79899 Other long term (current) drug therapy: Secondary | ICD-10-CM | POA: Diagnosis not present

## 2024-04-06 DIAGNOSIS — M19072 Primary osteoarthritis, left ankle and foot: Secondary | ICD-10-CM | POA: Diagnosis not present

## 2024-04-06 DIAGNOSIS — J811 Chronic pulmonary edema: Secondary | ICD-10-CM | POA: Diagnosis not present

## 2024-04-06 DIAGNOSIS — I480 Paroxysmal atrial fibrillation: Secondary | ICD-10-CM | POA: Diagnosis not present

## 2024-04-06 DIAGNOSIS — I5033 Acute on chronic diastolic (congestive) heart failure: Secondary | ICD-10-CM | POA: Diagnosis not present

## 2024-04-06 DIAGNOSIS — Z7982 Long term (current) use of aspirin: Secondary | ICD-10-CM | POA: Diagnosis not present

## 2024-04-06 DIAGNOSIS — Z7901 Long term (current) use of anticoagulants: Secondary | ICD-10-CM | POA: Diagnosis not present

## 2024-04-06 DIAGNOSIS — I509 Heart failure, unspecified: Secondary | ICD-10-CM | POA: Diagnosis not present

## 2024-04-06 DIAGNOSIS — I083 Combined rheumatic disorders of mitral, aortic and tricuspid valves: Secondary | ICD-10-CM | POA: Diagnosis not present

## 2024-04-06 DIAGNOSIS — J90 Pleural effusion, not elsewhere classified: Secondary | ICD-10-CM | POA: Diagnosis not present

## 2024-04-06 DIAGNOSIS — I441 Atrioventricular block, second degree: Secondary | ICD-10-CM | POA: Diagnosis not present

## 2024-04-06 DIAGNOSIS — G4730 Sleep apnea, unspecified: Secondary | ICD-10-CM | POA: Diagnosis not present

## 2024-04-06 DIAGNOSIS — R0602 Shortness of breath: Secondary | ICD-10-CM | POA: Diagnosis not present

## 2024-04-06 DIAGNOSIS — D649 Anemia, unspecified: Secondary | ICD-10-CM | POA: Diagnosis not present

## 2024-04-06 DIAGNOSIS — I2510 Atherosclerotic heart disease of native coronary artery without angina pectoris: Secondary | ICD-10-CM | POA: Diagnosis not present

## 2024-04-06 DIAGNOSIS — I4892 Unspecified atrial flutter: Secondary | ICD-10-CM | POA: Diagnosis not present

## 2024-04-06 DIAGNOSIS — E785 Hyperlipidemia, unspecified: Secondary | ICD-10-CM | POA: Diagnosis not present

## 2024-04-06 DIAGNOSIS — I5031 Acute diastolic (congestive) heart failure: Secondary | ICD-10-CM | POA: Diagnosis not present

## 2024-04-06 DIAGNOSIS — I110 Hypertensive heart disease with heart failure: Secondary | ICD-10-CM | POA: Diagnosis not present

## 2024-04-06 DIAGNOSIS — I471 Supraventricular tachycardia, unspecified: Secondary | ICD-10-CM | POA: Diagnosis not present

## 2024-04-06 DIAGNOSIS — E876 Hypokalemia: Secondary | ICD-10-CM | POA: Diagnosis not present

## 2024-04-06 DIAGNOSIS — J9811 Atelectasis: Secondary | ICD-10-CM | POA: Diagnosis not present

## 2024-04-06 DIAGNOSIS — Z0389 Encounter for observation for other suspected diseases and conditions ruled out: Secondary | ICD-10-CM | POA: Diagnosis not present

## 2024-04-06 DIAGNOSIS — I4891 Unspecified atrial fibrillation: Secondary | ICD-10-CM | POA: Diagnosis not present

## 2024-04-06 NOTE — H&P (Addendum)
 Cardiology H&P  Assessment/Plan:  3F PMHx pAF/AFL s/p CTI ablation 2023, AVNRT s/p slow pathway modification 2022, 2nd degree AVB Mobitz II (on event monitor), HFpEF (EF 50-55% 2024), CAD (LHC 2023 non-obstructive), HTN, HLD, atrial myxoma s/p resection 2016 p/w SOB found with in slow AF with fluid overload in ADHF and slow AF.  #ADHF #HFpEF TTE 2024 EF 50-55%. Evidence of volume overload on exam. Not on home diuretics. - Obtain TTE - Start Lasix  IV 40mg  q12, aim for UOP 3-5L daily, if inadequate output then will increase to IV 80mg  q12 - Consolidate home losartan  50mg  q12 to 100mg  daily - Continue home spironolactone 25mg  daily - Start empagliflozin 10mg  daily  #AF/AFL #AVNRT #2nd Degree AVB Mobitz II Previously had slow pathway modification 2022 and CTI ablation 2023, AVN blockers was avoided due to 2nd degree AVB Mobitz II which was noted on event monitor, pt self discontinued apixaban  2024, recently was noted to have anemia with subsequent positive FOBT. EKG showed HR 63 in AF. - Obtain GI consult for recent positive FOBT, pt was referred to GI outpatient but has not been able to see them - If safe from bleeding perspective then start apixaban  5mg  q12 - Defer cardioversion to her primary cardiologists at [facility], they were planning on cardioverting her if safe on AC  #HTN SBP 180's in ED. Pt is on losartan  at home though she didn't have it with her pill bottles and unclear if she is taking it at home. - Losartan  and spironolactone as above - If still hypertensive then will start chlorthalidone  #CAD LHC 2023 mLAD 50% stenosis, iFR negative - Check lipid panel, Lp(a), A1c - Start atorvastatin 40mg  daily  Follow up with Lonni Meager NP and EP Ole Holts MD at [facility] on discharge  Diet: regular DVT Ppx: lovenox  Code Status: full Emergency Contact: Doyal Dayhoff [phone]   ___________________________________________________________________  HPI: 18F PMHx pAF/AFL  s/p CTI ablation 2023, AVNRT s/p slow pathway modification 2022, 2nd degree AVB Mobitz II (on event monitor), HFpEF (EF 50-55% 2024), CAD (LHC 2023 non-obstructive), HTN, HLD, atrial myxoma s/p resection 2016 p/w SOB found with in slow AF with fluid overload in ADHF and slow AF.  Patient reports increasing palpitations over over the past few months. In the past month she has had worsening SOB and fatigue and currently feels symptoms when walking around her home. She has had leg swelling as well. She has not had CP, fever, cough, diarrhea.   Patient follows with cardiology at [facility]. Spironolactone was started for HFpEF but loop diuretic was avoided due to hypokalemia and mobility issues preventing the patient from frequently using the bathroom. Patient self discontinued AC in 2024 and recently was found with positive FOBT as part of an anemia workup so she was referred to GI but not yet seen.  Review of Systems: 10 systems reviewed and are negative unless otherwise mentioned in HPI  Medical History: Past Medical History[1]  Surgical History: Past Surgical History[2]  Allergies: Patient has no known allergies.  Medications:  Prior to Admission medications  Not on File     Social History: Tobacco use:   has no history on file for tobacco use. Alcohol use:   has no history on file for alcohol use. Drug use:  has no history on file for drug use. Living situation: the patient lives alone.  Family History: Family History[3]  Objective:  Temp:  [36.4 C (97.5 F)-36.9 C (98.4 F)] 36.9 C (98.4 F) Pulse:  [49-68]  49 SpO2 Pulse:  [49-68] 49 Resp:  [18-23] 23 BP: (166-183)/(72-93) 183/72 MAP (mmHg):  [101-116] 103 SpO2:  [88 %-96 %] 96 % BMI (Calculated):  [28.1] 28.1 Wt Readings from Last 3 Encounters:  04/06/24 78.9 kg (174 lb)    Physical Exam: General: Alert, NAD HEENT: Sclera anicteric. Oropharyx without exudate or erythema. Lymph: No cervical lymphadenopathy.  Heart:  Bradycardic, iiregular Lungs: CTAB w/o wheezes or crackles, no increased work of breathing Abdomen: Soft, non-tender, non distended. Normal bowel sounds Extremities: 2+ pitting edema bilaterally. Legs are warm Neuro: AAOx4. CNs 2-12 intact and symmetric. 5/5 strength in b/l upper and lower extremities. Sensation intact throughout. Cerebellar function intact with finger-to-nose testing b/l.  Labs/Studies: Labs and Studies from the last 24hrs per EMR and Reviewed       [1] No past medical history on file. [2] No past surgical history on file. [3] No family history on file.

## 2024-04-06 NOTE — Telephone Encounter (Signed)
 Patient went to the ED today and has been admitted for further work up.

## 2024-04-06 NOTE — ED Provider Notes (Signed)
 [name]    ED Clinical Impression     Diagnosis ICD-10-CM Associated Orders  1. Shortness of breath  R06.02     2. Atrial fibrillation, unspecified type    (CMS-HCC)  I48.91           Impression, Medical Decision Making, Progress Notes and Critical Care    Impression: 83 year old female presenting with a month of shortness of breath, palpitations, and intermittent bilateral lower extremity edema.  She visited her primary care provider on Friday and believes that she may have been told that she has A-fib but is unsure of the diagnosis.  She was started on spironolactone and potassium chloride tablets a few days ago at this visit but is not on any medications for atrial fibrillation currently. Her only other medication is losartan .  Her symptoms have been progressively worsening over the past month and are worse when she lies flat and with activity and are extremely disabling to the point where she can barely walk to the mailbox.  She denies any chest pain, cough, fevers, nausea, vomiting, headaches, or changes in vision.  She believes she was on atenolol in 2021 but is not sure why and has not taken it since 2021. Her she denies any history of problems with her heart or her lungs but stated that approximately seven years ago she had a tumor removed from her heart.  On physical exam heart rate was regular, rhythm on the monitor atrial fibrillation, lungs diminished in the bases bilaterally, trace edema present in the bilateral lower extremities.  No chest wall tenderness.  She is requiring 2 L oxygen to maintain saturations above 90%.   Differential diagnosis considered include but are not limited to: Atrial fibrillation, atrial flutter, CHF, pulmonary embolism, acute coronary syndrome, electrolyte abnormality, hyperthyroidism  Will Order: CBC, CMP, troponin, EKG, BNP, chest x-ray, point-of-care ultrasound  ED Course as of 04/06/24 1523   Thu Apr 06, 2024  1002 ECG atrial fibrillation with a rate of 63 BPM.   1025 CBC un actionable.   1054 Creatinine(!): 1.08  1054 eGFR CKD-EPI (2021) Female(!): 50  1054 hsTroponin I: 19  1115 B lines present on lung ultrasound. Reduced cardiac function on ultrasound.   1128 PRO-BNP(!): 1,569.0  1131 MAO paged for admission.   1131 O2 Flow Rate (L/min)(S): 2 L/min Patient requiring 2 L, baseline room air  1132 Chest X-ray  FINDINGS:   Diffuse bilateral interstitial opacities. Small bilateral pleural effusions with adjacent atelectasis. No pneumothorax.   Normal heart size and mediastinal contours. Aortic calcifications.   IMPRESSION:   Interstitial pulmonary edema. Small bilateral pleural effusions with adjacent bibasilar atelectasis.   37 Spoke with Dr Thom with cardiology, will plan to admit patient for further workup.   1353 Patient remained hemodynamically stable throughout her stay in the ER. She was admitted to the floor for further management.     Additional MDM Elements     Discussion with other professionals: Admitting team - cardiology Independent interpretation: EKG(s) -   X-ray(s) -   Ultrasound(s) -    Portions of this record have been created using Scientist, clinical (histocompatibility and immunogenetics). Dictation errors have been sought, but may not have been identified and corrected.  See chart and nursing documentation for additional ED course details.  ____________________________________________      History     Reason for Visit Shortness of Breath  History of Present Illness Julie Duncan is an 83 year old female who presents with worsening  shortness of breath and palpitations. She is accompanied by her daughter, Julie Duncan.  Over the past month, she has experienced worsening shortness of breath and palpitations. The symptoms began gradually and have progressed to the point where she feels breathless with minimal activity, such as bending over or standing up. She describes the  sensation as 'it just takes my breath away' and needs to stand still to catch her breath. No chest pain is reported, but she experiences a rapid heartbeat, particularly noticeable when lying awake at night. Physical activity exacerbates these symptoms.  She was told by a PA last Friday that she may have atrial fibrillation, but she was unsure about the diagnosis. She has a history of taking atenolol in May 2021 for similar, though less severe, symptoms and has not been taking atenolol for years. A few days ago she was proscribed spironolactone and potassium but discontinued them due to concerns they might be causing her symptoms. She also takes a blood pressure medication twice daily and various vitamins.  She reports no history of heart failure or recent surgeries. 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 She has been taking aspirin  and was advised to see a gastroenterologist due to a speck of blood found in her stool.    Past Medical History[1]  Past Surgical History[2]   Current Facility-Administered Medications:  .  acetaminophen  (TYLENOL ) tablet 650 mg, 650 mg, Oral, Q4H PRN, Sheng, Siyuan Peter, MD .  atorvastatin (LIPITOR) tablet 40 mg, 40 mg, Oral, Daily, Sheng, Siyuan Peter, MD .  empagliflozin (JARDIANCE) tablet 10 mg, 10 mg, Oral, Daily, Sheng, Siyuan Peter, MD, 10 mg at 04/06/24 1433 .  enoxaparin (LOVENOX) syringe 40 mg, 40 mg, Subcutaneous, Q24H, Sheng, Siyuan Peter, MD, 40 mg at 04/06/24 1433 .  furosemide  (LASIX ) injection 40 mg, 40 mg, Intravenous, BID, Sheng, Siyuan Peter, MD, 40 mg at 04/06/24 1433 .  losartan  (COZAAR ) tablet 100 mg, 100 mg, Oral, Daily, Sheng, Siyuan Peter, MD, 100 mg at 04/06/24 1433 .  polyethylene glycol (MIRALAX) packet 17 g, 17 g, Oral, Daily, Sheng, Siyuan Peter, MD .  spironolactone (ALDACTONE) tablet 25 mg, 25 mg, Oral, Daily, Sheng, Siyuan Peter, MD, 25 mg at  04/06/24 1436  Allergies Patient has no known allergies.  Family History[3]  Short Social History[4]    Physical Exam   ED Triage Vitals  Enc Vitals Group     BP 04/06/24 0920 166/93     Pulse 04/06/24 0913 63     SpO2 Pulse --      Resp 04/06/24 0913 18     Temp 04/06/24 0913 36.4 C (97.5 F)     Temp Source 04/06/24 0913 Oral     SpO2 04/06/24 0913 94 %     Weight 04/06/24 0913 78.9 kg (174 lb)     Height 04/06/24 0913 1.676 m (5' 6)     Head Circumference --      Peak Flow --      Pain Score --      Pain Loc --      Pain Education --      Exclude from Growth Chart --     Constitutional: Alert and oriented. Well appearing and in no distress. Eyes: Conjunctivae are normal. ENT      Head: Normocephalic and atraumatic.      Nose: No congestion.      Mouth/Throat: Mucous membranes are moist.  Neck: No stridor. Hematological/Lymphatic/Immunilogical: No cervical lymphadenopathy. Cardiovascular: Normal rate, regular rhythm. Normal and symmetric distal pulses are present in all extremities. Respiratory: Normal respiratory effort. Breath sounds are normal. Gastrointestinal: Soft and nontender. There is no CVA tenderness. Musculoskeletal: Normal range of motion in all extremities.      Right lower leg: No tenderness, trace edema      Left lower leg: No tenderness, trace edema Neurologic: Normal speech and language. No gross focal neurologic deficits are appreciated. Skin: Skin is warm, dry and intact. No rash noted. Psychiatric: Mood and affect are normal. Speech and behavior are normal.    Radiology   XR Chest 2 views  Final Result    Interstitial pulmonary edema. Small bilateral pleural effusions with adjacent bibasilar atelectasis.          ED POCUS      Echocardiogram W Colorflow Spectral Doppler    (Results Pending)     Procedures including Critical Care        [1] No past medical history on file. [2] No past surgical history on  file. [3] No family history on file. [4]    Lelon Comer BROCKS, MD Resident 04/06/24 [id number]

## 2024-04-07 NOTE — Discharge Summary (Signed)
 ------------------------------------------------------------------------------- Attestation signed by Brock Odell Ned, MD at 04/08/24 [phone] Attending Physician Attestation:    I confirm that I discussed the patient, reviewed all the relevant lab and diagnostic data, and we agreed upon a plan of treatment. I agree with the findings, assessment, and plan as documented by Dr. Neita.  I discussed the patient, participating in the key portions of the service on the day of discharge. I reviewed the resident's note and agree with the discharge plans and disposition. I personally spent less than 30 minutes in discharge planning services.   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   #AOC HFpEF Evidence of volume overload on exam at presentation. Reported LEE, DOE. No PTA loop diuretic due to h/o hypokalemia, but the pt was prescribed PTA spironolactone shortly before admission. Admit proBNP 1569.0. O2 requirement of 2L [place] O2 at admission. TTE 04/06/24 LVEF >55% and suggested RAP 10-77mmHg. Estimated PASP . LAE at 4.0cm. She was started on IV lasix  40mg  BID with sub-optimal net output recorded at -500cc on 04/07/24 and increased to IV lasix  80mg  BID. Clinically, today, she does look improved on exam. Cr 1.42 and BUN 15. K 3.3, repleted today with. Mg 1.9 with pt started on magnesium oxide 400mg  daily. No wt yet today with admission wt of 78.9kg. While in the room, Julie Duncan O2 removed with oxygen saturations variable from 95%-99% ORA. We then ambulated with a pulse ox today and O2 sats did not drop below 95%. She will be transitioned to po lasix  40mg  daily at discharge with KCL tab 20mEq daily and magnesium oxide 400mg  daily at discharge. We will also discharge on consolidated PTA Losartan  100mg  daily. She should continue PTA spironolactone 25mg  daily and newly started empagliflozin 10mg  daily.   #Paroxysmal AF/AFL #AVNRT #2nd Degree AVB Mobitz 1&II Previously had slow pathway modification 2022 and CTI ablation 2023, AVN blockers was avoided due to 2nd degree AVB Mobitz II which was noted on event monitor, pt self discontinued apixaban  2024, recently was noted to have anemia with subsequent positive FOBT. EKG showed HR 63 in AF. Education provided to patient regarding the importance of resuming OAC to reduce risk of stroke. Patient educated that ASA is not sufficient to reduce risk. Of note, sleep apnea considered in this patient; however, unclear if patient would wear a CPAP.  GI consult performed for recent positive FOBT and GI recommends initiation of OAC (see telephone note 04/06/24). Will start apixaban  5mg  q12. Pt reports this OAC as expensive. Stopped  by our PAP today and they report she does not qualify for our PAP. Sent the pharmacist a message to get a quote for cost--> test claim returns that Eliquis  cost is $47.00 and patient is agreeable to this price. No indication for AV nodal blocking agent at this time and with prior h/o Mobitz 1 and 2. Defer DCCV to her primary cardiology team at [facility] and once therapeutic on OAC / on OAC for at least 3 weeks with no missed doses. Patient will also benefit from EP / Dr. Cindie eval as previously noted by [facility]. At discharge, continue Eliquis  5mg  BID.   #HTN Poorly controlled. SBP 180's at presentation. Pt was on losartan  50mg  BID and spironolactone 25mg  daily at home, though she didn't have it with her, and it is unclear if she is taking it at home. During admission, she received PRN hydralazine  50mg  q6h for SBP >180. At discharge, continue Losartan  and spironolactone as above. Started on amlodipine  10mg  daily, which she should also continue at discharge.   #CAD LHC 2023 mLAD 50% stenosis, iFR negative Reports worsening dyspnea in setting of overload; however, also considered is anginal equivalent. HS Tn 19. EKG without acute ST/T changes. Total cholesterol 213, LDL 152. Continue newly started atorvastatin 40mg  daily. Especially with ongoing sx, consider stress testing in outpatient setting.   #History of hypokalemia Consider workup for hyperaldosteronism in the outpatient setting given unexplained hypokalemia, hypertension.   Outpatient Follow Up Issues:  Labs/Meds/Pending results:  Repeat BMP with your BMP in 1 week  Procedures: None No admission procedures for hospital encounter. ______________________________________________________________________  Discharge Day Services: Pt seen on the day of discharge and determined appropriate for discharge. BP 166/71   Pulse 88   Temp 36.7 C (98 F) (Oral)   Resp 22   Ht 167.6 cm (5' 6)   Wt 78.9 kg (174 lb)   SpO2 94%   BMI 28.08  kg/m   Admission wt = Weight: 78.9 kg (174 lb) Last wt = Weight: 78.9 kg (174 lb) Last 30 Recorded Weights   04/06/24 0913  Weight: 78.9 kg (174 lb)    Physical Exam Constitutional:      Appearance: Normal appearance.  HENT:     Head: Normocephalic and atraumatic.     Mouth/Throat:     Mouth: Mucous membranes are moist.  Eyes:     Extraocular Movements: Extraocular movements intact.     Pupils: Pupils are equal, round, and reactive to light.  Cardiovascular:     Rate and Rhythm: Normal rate and regular rhythm.     Pulses: Normal pulses.     Heart sounds: Normal heart sounds. No murmur heard.    No friction rub.  Pulmonary:     Effort: Pulmonary effort is normal.     Breath sounds: Normal breath sounds.  Abdominal:     General: Abdomen is flat. Bowel sounds are normal.     Palpations: Abdomen is soft.  Musculoskeletal:        General: No swelling or tenderness. Normal range of motion.     Cervical back: Normal range of motion and neck supple.     Right lower leg: No edema.     Left lower leg: No edema.  Skin:    General: Skin is warm and dry.  Neurological:     General: No focal deficit present.     Mental Status: She is alert and oriented to person, place, and time. Mental status is at baseline.  Psychiatric:  Mood and Affect: Mood normal.       Condition at Discharge: good ______________________________________________________________________ Discharge Medications:   Your Medication List     START taking these medications    amlodipine  10 MG tablet Commonly known as: NORVASC  Take 1 tablet (10 mg total) by mouth daily. Start taking on: April 09, 2024   apixaban  5 mg Tab Commonly known as: ELIQUIS  Take 1 tablet (5 mg total) by mouth two (2) times a day.   atorvastatin 40 MG tablet Commonly known as: LIPITOR Take 1 tablet (40 mg total) by mouth daily.   empagliflozin 10 mg tablet Commonly known as: JARDIANCE Take 1 tablet (10 mg total) by  mouth daily.   furosemide  40 MG tablet Commonly known as: LASIX  Take 1 tablet (40 mg total) by mouth daily. Start taking on: April 10, 2024   magnesium oxide 400 mg (241.3 mg elemental) tablet Commonly known as: MAG-OX Take 1 tablet (400 mg total) by mouth daily.       CHANGE how you take these medications    losartan  100 MG tablet Commonly known as: COZAAR  Take 1 tablet (100 mg total) by mouth daily. What changed:  medication strength how much to take when to take this       CONTINUE taking these medications    aspirin  81 MG tablet Commonly known as: ECOTRIN Take 1 tablet (81 mg total) by mouth daily.   potassium chloride 20 MEQ ER tablet Take 1 tablet (20 mEq total) by mouth daily.   spironolactone 25 MG tablet Commonly known as: ALDACTONE Take 1 tablet (25 mg total) by mouth daily.        ______________________________________________________________________ Pending Test Results (if blank, then none): Pending Labs     Order Current Status   Lipoprotein a (LP(a)) In process       Most Recent Labs: Recent Labs    Units 04/08/24 0609 04/08/24 1401  NA mmol/L 147* 143  K mmol/L 3.5 3.3*  CL mmol/L 101 97*  CO2 mmol/L 33.3* 32.4*  BUN mg/dL 16 17  CREATININE mg/dL 8.47* 8.57*  CALCIUM  mg/dL 9.3 9.2  MG mg/dL 1.9  --    Recent Labs    Units 04/08/24 0609  WBC 10*9/L 6.4  HGB g/dL 88.8*  HCT % 66.6*  PLT 10*9/L 322   Lab Results  Component Value Date   ALKPHOS 103 04/08/2024   BILITOT 0.8 04/08/2024   BILIDIR 0.20 04/08/2024   PROT 6.9 04/08/2024   ALBUMIN 3.3 (L) 04/08/2024   ALT 21 04/08/2024   AST 21 04/08/2024   Lab Results  Component Value Date   PRO-BNP 1,569.0 (H) 04/06/2024   Microbiology Results (last day)     ** No results found for the last 24 hours. [facility] Radiology: ECG 12 Lead Result Date: 04/06/2024 ATRIAL FIBRILLATION MINIMAL VOLTAGE CRITERIA FOR LVH, MAY BE NORMAL VARIANT POSSIBLE SEPTAL  INFARCT NONSPECIFIC ST AND T WAVE ABNORMALITY ABNORMAL ECG NO PREVIOUS ECGS AVAILABLE Confirmed by Lennie Cough [phone]) on 04/06/2024 10:33:18 PM  CT Head Wo Contrast Result Date: 04/06/2024 EXAM: Computed tomography, head or brain without contrast material. ACCESSION: [id number] UN CLINICAL INDICATION: 83 years old Female with R/o intracranial bleed  COMPARISON: None TECHNIQUE: Axial CT images of the head from skull base to vertex without contrast. FINDINGS: No evidence of acute intracranial hemorrhage, extra-axial collections, or acute ischemia. No intracranial mass, mass effect, or midline shift. The basal cisterns are patent. The gray-white matter distinction is  preserved. Nonspecific ill-defined white matter hypoattenuation bilaterally consistent with chronic microvascular ischemia. There are calcifications involving the carotid siphons. There has been cataract surgery bilaterally. Osteopenia. No suspicious osseous lesions. Mild mucosal thickening in a hypoplastic left frontal sinus. Paranasal sinuses are otherwise clear. Trace left mastoid fluid. There is rightward deviation of the nasal septum.   No acute appearing intracranial abnormality.   Echocardiogram W Colorflow Spectral Doppler With Contrast Result Date: 04/06/2024 Patient Info Name:     Julie Duncan Age:     70 years DOB:     [birth year] Gender:     Female MRN:     [id number] Accession #:     [id number] UN Account #:     [id number] Ht:     168 cm Wt:     79 kg BSA:     1.94 m2 BP:     198 /     68 mmHg HR:     49 bpm Exam Date:     04/06/2024 2:40 PM Admit Date:     04/06/2024 Exam Type:     ECHOCARDIOGRAM W COLORFLOW SPECTRAL DOPPLER W CONTRAST Technical Quality:     Poor Reason for Poor Study:     poor echocardiographic windows Staff Sonographer:     Cletus Daring Reading Fellow:     Waddell Armin Satchel MD Ordering Physician:     Earla Maude Currier MD Study Info Indications      - CHF ; Definity/Optison Procedure(s)   Complete  two-dimensional, color flow and Doppler transthoracic echocardiogram is performed with contrast to opacify the left ventricle and to improve the delineation of the left ventricle endocardial borders. Ultrasound Enhancing Agent/Agitated Saline ------------------------------ UEA/Ag. Saline:     Optison Amount:     3.00 ml Existing IV Access:     Yes IV Access Condition:     patent with no signs of infiltration Summary   1. Technically difficult study.   2. The left ventricle is normal in size with mildly increased wall thickness.   3. The left ventricular systolic function is normal, LVEF is visually estimated at > 55%.   4. There is mild mitral valve regurgitation.   5. There is mild aortic regurgitation.   6. The right ventricle is normal in size, with normal systolic function.   7. IVC size and inspiratory change suggest elevated right atrial pressure. (10-20 mmHg). Left Ventricle   The left ventricle is normal in size with mildly increased wall thickness. The left ventricular systolic function is normal, LVEF is visually estimated at > 55%. Left ventricular diastolic function cannot be accurately assessed. Right Ventricle   The right ventricle is normal in size, with normal systolic function. Left Atrium   The left atrium is normal in size. Right Atrium   The right atrium is normal in size. Aortic Valve   The aortic valve is trileaflet with mildly thickened leaflets with normal excursion. There is mild aortic regurgitation. There is no evidence of a significant transvalvular gradient. Mitral Valve   The mitral valve leaflets are mildly thickened with normal leaflet mobility. Mild anterior mitral valve prolapse. There is mild mitral valve regurgitation. There is mild prolapse of the anterior mitral valve leaflet. Tricuspid Valve   The tricuspid valve leaflets are normal, with normal leaflet mobility. There is mild tricuspid regurgitation. There is no pulmonary hypertension. TR maximum velocity: 2.4 m/s  Estimated  PASP: 38 mmHg. Pulmonic Valve   The pulmonic valve is poorly visualized, but probably normal. There  is trivial pulmonic regurgitation. There is no evidence of a significant transvalvular gradient. Aorta   The aorta is normal in size in the visualized segments. Inferior Vena Cava   IVC size and inspiratory change suggest elevated right atrial pressure. (10-20 mmHg). Pericardium/Pleural   There is no pericardial effusion. Other Findings   Rhythm: Bradycardia. Ventricles ---------------------------------------------------------------------- Name                                 Value        Normal ---------------------------------------------------------------------- LV Dimensions 2D/MM ----------------------------------------------------------------------  IVS Diastolic Thickness (2D)                                1.0 cm       0.6-0.9 LVID Diastole (2D)                  4.2 cm       3.8-5.2  LVPW Diastolic Thickness (2D)                                1.1 cm       0.6-0.9 LVID Systole (2D)                   2.8 cm       2.2-3.5 LVOT Diameter                       1.9 cm               LV Mass Index (2D Cubed)           78 g/m2         43-95  Relative Wall Thickness (2D)                                  0.53        <=0.42 LV Function ---------------------------------------------------------------------- LV EF (4C MOD)                        70 %                LV Diastolic Volume Index (BP MOD)                        48.3 ml/m2     29.0-61.0 LV EF (BP MOD)                        72 %         54-74 RV Dimensions 2D/MM ----------------------------------------------------------------------  RV Basal Diastolic Dimension                           3.8 cm       2.5-4.1 TAPSE                               3.2 cm         >=1.7 Atria ---------------------------------------------------------------------- Name  Value        Normal ----------------------------------------------------------------------  LA Dimensions ---------------------------------------------------------------------- LA Dimension (2D)                   4.0 cm       2.7-3.8 LA Volume Index (4C A-L)        17.10 ml/m2               LA Volume Index (2C A-L)        20.73 ml/m2               RA Dimensions ---------------------------------------------------------------------- RA Area (4C)                      16.6 cm2        <=18.0 RA Area (4C) Index              8.6 cm2/m2               RA ESV Index (4C MOD)             22 ml/m2         15-27 Left Ventricular Outflow Tract ---------------------------------------------------------------------- Name                                 Value        Normal ---------------------------------------------------------------------- LVOT 2D ---------------------------------------------------------------------- LVOT Diameter                       1.9 cm               LVOT Area                          2.9 cm2               LVOT Doppler ---------------------------------------------------------------------- LVOT VTI                             16 cm               LVOT Stroke Volume                   46 ml               LVOT SI                           24 ml/m2 Aortic Valve ---------------------------------------------------------------------- Name                                 Value        Normal ---------------------------------------------------------------------- AV Doppler ---------------------------------------------------------------------- AV Mean Gradient                    2 mmHg               AV VTI                               27 cm               AV Area (Cont Eq VTI)  1.7 cm2         >=3.0 AV Area Index (Cont Eq VTI)     0.9 cm2/m2               AV DI (VTI)                           0.60 Mitral Valve ---------------------------------------------------------------------- Name                                 Value        Normal  ---------------------------------------------------------------------- MV Diastolic Function ---------------------------------------------------------------------- MV E Peak Velocity                112 cm/s               MV Annular TDI ---------------------------------------------------------------------- MV Septal e' Velocity             6.2 cm/s         >=8.0 MV E/e' (Septal)                      18.1               MV Lateral e' Velocity            8.4 cm/s        >=10.0 MV E/e' (Lateral)                     13.4               MV e' Average                     7.3 cm/s               MV E/e' (Average)                     15.7 Tricuspid Valve ---------------------------------------------------------------------- Name                                 Value        Normal ---------------------------------------------------------------------- TV Regurgitation Doppler ---------------------------------------------------------------------- TR Peak Velocity                   2.4 m/s               Estimated PAP/RSVP ---------------------------------------------------------------------- RA Pressure                        15 mmHg           <=5 RV Systolic Pressure               38 mmHg           <36 Pulmonic Valve ---------------------------------------------------------------------- Name                                 Value        Normal ---------------------------------------------------------------------- PV Doppler ---------------------------------------------------------------------- PV Peak Velocity                   0.8 m/s Aorta ---------------------------------------------------------------------- Name  Value        Normal ---------------------------------------------------------------------- Ascending Aorta ---------------------------------------------------------------------- Ao Root Diameter (2D)               2.9 cm               Ao Root Diam Index (2D)          1.5 cm/m2                Ascending Aorta Diameter            3.0 cm Venous ---------------------------------------------------------------------- Name                                 Value        Normal ---------------------------------------------------------------------- IVC/SVC ---------------------------------------------------------------------- IVC Diameter (Exp 2D)               2.7 cm         <=2.1 Report Signatures Resident Waddell Armin Satchel  MD on 04/06/2024 03:45 PM  XR Chest 2 views Result Date: 04/06/2024 EXAM: XR CHEST 2 VIEWS ACCESSION: [id number] UN REPORT DATE: 04/06/2024 11:28 AM CLINICAL INDICATION: SHORTNESS OF BREATH  TECHNIQUE: PA and Lateral Chest Radiographs COMPARISON: None FINDINGS: Diffuse bilateral interstitial opacities. Small bilateral pleural effusions with adjacent atelectasis. No pneumothorax. Normal heart size and mediastinal contours. Aortic calcifications.   Interstitial pulmonary edema. Small bilateral pleural effusions with adjacent bibasilar atelectasis.   ED POCUS Result Date: 04/06/2024 Limited Cardiac Ultrasound (CPT [phone]) Indication: A focused ultrasound exam of the heart was performed to evaluate for pericardial effusion, tamponade, severe hypovolemia, or gross abnormalities of cardiac anatomy or function in this patient. The ultrasound was performed with the following indications, as noted in the H&P: Dyspnea Identified structures: The pericardial sac, myocardium, and 4 chambers were identified using the following views: parasternal long axis, parasternal short axis, apical 4-chamber, and IVC (long axis) Findings: Exam of the above structures revealed the following findings:  Pericardial effusion: Absent   Pericardial tamponade: N/A  Global LV function: Reduced  Right ventricular size: Normal   Signs of RV strain: N/A  IVC: Normal  Other findings: B Limitations: Unable to obtain subxiphoid view due to patient discomfort.  No sonographic evidence of significant pericardial  effusion, Normal RV, and No sonographic evidence of volume depletion Limited Thoracic Ultrasound (CPT: 23395-73) Indication: A focused ultrasound of the pleural spaces was performed to evaluate for pneumothorax or pulmonary edema. The ultrasound was performed with the following indications, as noted in the H&P: Dyspnea Identified structures: The thoracic cavities and diaphragm were examined. Findings: Exam of the above structures revealed the following findings in the pleural spaces:  Evaluation of pneumothorax: Absent     B-lines: Present:   Left and Right      Pleural effusion: Absent    Impression: Other: B Lines present Interpreted by: Comer JAYSON Ferrier, MD   ______________________________________________________________________  Discharge Plan and Instructions:    Appointments:   Length of Discharge: I spent greater than 30 mins in the discharge of this patient.

## 2024-04-09 ENCOUNTER — Ambulatory Visit: Payer: Self-pay | Admitting: Family Medicine

## 2024-04-10 ENCOUNTER — Ambulatory Visit: Payer: Self-pay | Admitting: Family

## 2024-04-11 ENCOUNTER — Ambulatory Visit: Payer: Self-pay

## 2024-04-11 ENCOUNTER — Other Ambulatory Visit (HOSPITAL_COMMUNITY): Payer: Self-pay

## 2024-04-11 NOTE — Telephone Encounter (Signed)
 Patient/caller refused triage.  Reason for refusal: already scheduled hospital follow up.   Copied from CRM #[id number]. Topic: Clinical - Red Word Triage >> Apr 11, 2024  1:28 PM Julie Duncan wrote: Red Word that prompted transfer to Nurse Triage: rapid heart beat and anxiety. Patient called to schedule hospital follow. Caller disconnected due to incoming call from hospital. Caller states she will call back. Reason for Disposition  Caller has cancelled the call before the first contact  Answer Assessment - Initial Assessment Questions Patient called back in before speaking with nurse triage. She has a hospital follow up scheduled on 04/14/24. Declines triage at this time. Will call back with any worsening or new symptoms.  Protocols used: No Contact or Duplicate Contact Call-A-AH

## 2024-04-11 NOTE — Telephone Encounter (Signed)
 Just for clarification, this is for a second opinion at an office in [place]?  They reviewed her referral and because it did not say anywhere on the referral that the patient wanted to be referred for Second opinion in [place], they closed her referral.

## 2024-04-12 NOTE — Telephone Encounter (Signed)
 I am not sure that she has ever been seen at either office, I do believe this is a new consult. She asked for the [place] location due to distance. But if they prefer us  to say it's a second opinion, technically then yes she would want that. thanks

## 2024-04-12 NOTE — Telephone Encounter (Signed)
 Please see what details you can get from patient on 04/13/2024.  Thanks.

## 2024-04-13 NOTE — Telephone Encounter (Signed)
 Phone call to pt, she reports that she has an appointment with Dr. Cleatus tomorrow.  Denied any worrisome symptoms.  Declined to discuss until her appointment tomorrow.  Does not sound like she is in distress over the phone.

## 2024-04-13 NOTE — Telephone Encounter (Signed)
 Noted. Thanks.

## 2024-04-13 NOTE — Telephone Encounter (Signed)
 Okay thanks. I will reopen and update the referral.

## 2024-04-14 ENCOUNTER — Encounter: Payer: Self-pay | Admitting: Family Medicine

## 2024-04-14 ENCOUNTER — Ambulatory Visit (INDEPENDENT_AMBULATORY_CARE_PROVIDER_SITE_OTHER): Admitting: Family Medicine

## 2024-04-14 ENCOUNTER — Telehealth: Payer: Self-pay

## 2024-04-14 VITALS — BP 130/66 | HR 77 | Temp 97.9°F | Ht 63.0 in | Wt 168.2 lb

## 2024-04-14 DIAGNOSIS — I10 Essential (primary) hypertension: Secondary | ICD-10-CM | POA: Diagnosis not present

## 2024-04-14 DIAGNOSIS — I509 Heart failure, unspecified: Secondary | ICD-10-CM | POA: Diagnosis not present

## 2024-04-14 DIAGNOSIS — R195 Other fecal abnormalities: Secondary | ICD-10-CM | POA: Diagnosis not present

## 2024-04-14 LAB — CBC WITH DIFFERENTIAL/PLATELET
Basophils Absolute: 0 K/uL (ref 0.0–0.1)
Basophils Relative: 0.6 % (ref 0.0–3.0)
Eosinophils Absolute: 0.2 K/uL (ref 0.0–0.7)
Eosinophils Relative: 2.5 % (ref 0.0–5.0)
HCT: 36.3 % (ref 36.0–46.0)
Hemoglobin: 11.9 g/dL — ABNORMAL LOW (ref 12.0–15.0)
Lymphocytes Relative: 26.9 % (ref 12.0–46.0)
Lymphs Abs: 2.1 K/uL (ref 0.7–4.0)
MCHC: 32.7 g/dL (ref 30.0–36.0)
MCV: 89.3 fl (ref 78.0–100.0)
Monocytes Absolute: 0.6 K/uL (ref 0.1–1.0)
Monocytes Relative: 8.2 % (ref 3.0–12.0)
Neutro Abs: 4.8 K/uL (ref 1.4–7.7)
Neutrophils Relative %: 61.8 % (ref 43.0–77.0)
Platelets: 393 K/uL (ref 150.0–400.0)
RBC: 4.07 Mil/uL (ref 3.87–5.11)
RDW: 15.2 % (ref 11.5–15.5)
WBC: 7.8 K/uL (ref 4.0–10.5)

## 2024-04-14 LAB — BASIC METABOLIC PANEL WITH GFR
BUN: 28 mg/dL — ABNORMAL HIGH (ref 6–23)
CO2: 32 meq/L (ref 19–32)
Calcium: 9.6 mg/dL (ref 8.4–10.5)
Chloride: 99 meq/L (ref 96–112)
Creatinine, Ser: 1.53 mg/dL — ABNORMAL HIGH (ref 0.40–1.20)
GFR: 30.84 mL/min — ABNORMAL LOW (ref >60.00)
Glucose, Bld: 123 mg/dL — ABNORMAL HIGH (ref 70–99)
Potassium: 3.9 meq/L (ref 3.5–5.1)
Sodium: 141 meq/L (ref 135–145)

## 2024-04-14 LAB — MAGNESIUM: Magnesium: 2.3 mg/dL (ref 1.5–2.5)

## 2024-04-14 MED ORDER — POTASSIUM CHLORIDE CRYS ER 20 MEQ PO TBCR: EXTENDED_RELEASE_TABLET | ORAL | Status: DC

## 2024-04-14 MED ORDER — POLYETHYLENE GLYCOL 3350 17 GM/SCOOP PO POWD: 17.0000 g | Freq: Every day | ORAL | Status: DC

## 2024-04-14 NOTE — Progress Notes (Signed)
 Inpatient f/u.  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   #AOC HFpEF Evidence of volume overload on exam at presentation. Reported LEE, DOE. No PTA loop diuretic due to h/o hypokalemia, but the pt was prescribed PTA spironolactone shortly before admission. Admit proBNP 1569.0. O2 requirement of 2L [place] O2 at admission. TTE 04/06/24 LVEF >55% and suggested RAP 10-78mmHg. Estimated PASP . LAE at 4.0cm. She was started on IV lasix  40mg  BID with sub-optimal net output recorded at -500cc on 04/07/24 and increased to IV lasix  80mg  BID. Clinically, today, she does look improved on exam. Cr 1.42 and BUN 15. K 3.3, repleted today with. Mg 1.9 with pt started on magnesium oxide 400mg  daily. No wt yet today with admission wt of 78.9kg. While in the room, [place] O2 removed  with oxygen saturations variable from 95%-99% ORA. We then ambulated with a pulse ox today and O2 sats did not drop below 95%. She will be transitioned to po lasix  40mg  daily at discharge with KCL tab 20mEq daily and magnesium oxide 400mg  daily at discharge. We will also discharge on consolidated PTA Losartan  100mg  daily. She should continue PTA spironolactone 25mg  daily and newly started empagliflozin 10mg  daily.  #Paroxysmal AF/AFL #AVNRT #2nd Degree AVB Mobitz 1&II Previously had slow pathway modification 2022 and CTI ablation 2023, AVN blockers was avoided due to 2nd degree AVB Mobitz II which was noted on event monitor, pt self discontinued apixaban  2024, recently was noted to have anemia with subsequent positive FOBT. EKG showed HR 63 in AF. Education provided to patient regarding the importance of resuming OAC to reduce risk of stroke. Patient educated that ASA is not sufficient to reduce risk. Of note, sleep apnea considered in this patient; however, unclear if patient would wear a CPAP. GI consult performed for recent positive FOBT and GI recommends initiation of OAC (see telephone note 04/06/24). Will start apixaban  5mg  q12. Pt reports this OAC as expensive. Stopped by our PAP today and they report she does not qualify for our PAP. Sent the pharmacist a message to get a quote for cost--> test claim returns that Eliquis  cost is $47.00 and patient is agreeable to this price. No indication for AV nodal blocking agent at this time and with prior h/o Mobitz 1 and 2. Defer DCCV to her primary cardiology team at [facility] and once therapeutic on OAC / on OAC for at least 3 weeks with no missed doses. Patient will also benefit from EP / Dr. Cindie eval as previously noted by [facility]. At discharge, continue Eliquis   5mg  BID.  #HTN Poorly controlled. SBP 180's at presentation. Pt was on losartan  50mg  BID and spironolactone 25mg  daily at home, though she didn't have it with her, and it is unclear if she is  taking it at home. During admission, she received PRN hydralazine  50mg  q6h for SBP >180. At discharge, continue Losartan  and spironolactone as above. Started on amlodipine  10mg  daily, which she should also continue at discharge.  #CAD LHC 2023 mLAD 50% stenosis, iFR negative Reports worsening dyspnea in setting of overload; however, also considered is anginal equivalent. HS Tn 19. EKG without acute ST/T changes. Total cholesterol 213, LDL 152. Continue newly started atorvastatin 40mg  daily. Especially with ongoing sx, consider stress testing in outpatient setting.  #History of hypokalemia Consider workup for hyperaldosteronism in the outpatient setting given unexplained hypokalemia, hypertension.   Outpatient Follow Up Issues:  Labs/Meds/Pending results:  Repeat BMP with your BMP in 1 week       START taking these medications   amlodipine  10 MG tablet Commonly known as: NORVASC  Take 1 tablet (10 mg total) by mouth daily. Start taking on: April 09, 2024  apixaban  5 mg Tab Commonly known as: ELIQUIS  Take 1 tablet (5 mg total) by mouth two (2) times a day.  atorvastatin 40 MG tablet Commonly known as: LIPITOR Take 1 tablet (40 mg total) by mouth daily.  empagliflozin 10 mg tablet Commonly known as: JARDIANCE Take 1 tablet (10 mg total) by mouth daily.  furosemide  40 MG tablet Commonly known as: LASIX  Take 1 tablet (40 mg total) by mouth daily. Start taking on: April 10, 2024  magnesium oxide 400 mg (241.3 mg elemental) tablet Commonly known as: MAG-OX Take 1 tablet (400 mg total) by mouth daily.     CHANGE how you take these medications   losartan  100 MG tablet Commonly known as: COZAAR  Take 1 tablet (100 mg total) by mouth daily. What changed:  medication strength how much to take when to take this     CONTINUE taking these medications   aspirin  81 MG tablet Commonly known as: ECOTRIN Take 1 tablet (81 mg total) by mouth daily.  potassium chloride  20 MEQ ER tablet Take 1 tablet (20 mEq total) by mouth daily.  spironolactone 25 MG tablet Commonly known as: ALDACTONE Take 1 tablet (25 mg total) by mouth daily.    ================= Inpatient course discussed with patient.  At this point her SOB and bendopathy have improved compared to admission status.    She had B lower shin itching w/o rash, unclear if from amlodipine , recently restarted that med per patient report.    No known black or bloody stools.  No recent BMs.  No abd pain.  Still passing gas.  D/w pt about starting miralax.   She cannot verify her medication use overall with certainty.  Discussed.  I sent her over the med list to verify her medications.  Discussed rationale for CHF treatment.  She has a history of positive fecal occult blood test.  Discussed rationale for seeing the GI clinic.  Information given to patient to call about a GI appointment.  She has previous referral in the EMR.  Discussed.  She needs an appointment with Dr. Mady.  Discussed that I will send him a staff message.  Meds, vitals, and allergies reviewed.   ROS: Per HPI unless specifically indicated in ROS section   Nad Ncat Neck supple, no LA ctab RRR Abd soft not ttp, normal BS.  No BLE edema.  35 minutes were devoted to patient care in this encounter (this includes time spent reviewing the patient's file/history, interviewing and examining the patient, counseling/reviewing plan with patient).

## 2024-04-14 NOTE — Patient Instructions (Addendum)
 Check your med list at home and let me know if it doesn't match your home meds.  Go to the lab on the way out.   If you have mychart we'll likely use that to update you.    Take care.  Glad to see you.  Please call about seeing GI in [place].  [facility] [place] [address] [place] [address] [phone]  If you feel worse, then go to the ER.  I would take miralax to help move your bowels.

## 2024-04-14 NOTE — Telephone Encounter (Signed)
 Call left message for pt to call [phone].Pt want to have 2nd opinion. I will let pt know that  new MD will be here in Dec. And possible unable to see her until Jan. Due to this being a 2nd opinion.

## 2024-04-16 ENCOUNTER — Telehealth: Payer: Self-pay | Admitting: Family Medicine

## 2024-04-16 ENCOUNTER — Ambulatory Visit: Payer: Self-pay | Admitting: Family Medicine

## 2024-04-16 DIAGNOSIS — I509 Heart failure, unspecified: Secondary | ICD-10-CM | POA: Insufficient documentation

## 2024-04-16 DIAGNOSIS — R7989 Other specified abnormal findings of blood chemistry: Secondary | ICD-10-CM

## 2024-04-16 DIAGNOSIS — R195 Other fecal abnormalities: Secondary | ICD-10-CM | POA: Insufficient documentation

## 2024-04-16 NOTE — Telephone Encounter (Signed)
 This patient was admitted with CHF and I would appreciate your clinic contacting her about follow-up.  Thanks.

## 2024-04-16 NOTE — Assessment & Plan Note (Signed)
 Will update cardiology.  Discussed rationale for current medications but she cannot verify all of her med use with certainty.  I sent her home with a medication list so she can check her medications at home.  The plan would be to continue amlodipine  aspirin  Lipitor Jardiance Lasix  losartan  and potassium for now.  We may need to change her Eliquis  depending on the outcome of her GI appointment and her labs.  Bleeding cautions discussed with patient.  See notes on labs.  I sent a note to Dr. Mady about her having follow-up in his clinic.

## 2024-04-16 NOTE — Assessment & Plan Note (Signed)
 I asked her to call about seeing GI in [place].  [facility] [place] [address] [place] [address] [phone]  If she feels worse, then go to the ER.  I would take miralax to help with constipation. Routine cautions given to patient.  At this point still okay for outpatient follow-up.

## 2024-04-17 ENCOUNTER — Ambulatory Visit: Payer: Self-pay

## 2024-04-17 NOTE — Telephone Encounter (Signed)
 LVM on his phone and daughters to have a hospital follow up with Dr. Mady or JAYSON Meager

## 2024-04-17 NOTE — Telephone Encounter (Addendum)
 Patient calling back from a message she had from the office This RN called the CAL to get the patient back in touch with office staff who called her earlier wanting to go over her labs and Patient was warm transferred to office staff Joellen for best patient care/continuation of care.  FYI Only or Action Required?: FYI only for provider.  Patient was last seen in primary care on 04/14/2024 by [name], MD.  Called Nurse Triage reporting Results.  Triage Disposition: Call PCP When Office is Open  Patient/caregiver understands and will follow disposition?: Yes             Copied from CRM #[id number]. Topic: Clinical - Lab/Test Results >> Apr 17, 2024  1:39 PM Rea ORN wrote: Reason for CRM: Pt returned call from [facility] regarding labs. Pt transferred to NT to give better explanation. Reason for Disposition  [1] Caller requesting NON-URGENT health information AND [2] PCP's office is the best resource  Answer Assessment - Initial Assessment Questions Patient calling back from a message she had from the office This RN called the CAL to get the patient back in touch with office staff who called her earlier wanting to go over her labs and Patient was warm transferred to office staff for best patient care/continuation of care.  Protocols used: Information Only Call - No Triage-A-AH

## 2024-04-19 ENCOUNTER — Other Ambulatory Visit

## 2024-04-19 DIAGNOSIS — R7989 Other specified abnormal findings of blood chemistry: Secondary | ICD-10-CM

## 2024-04-19 LAB — BASIC METABOLIC PANEL WITH GFR
BUN: 26 mg/dL — ABNORMAL HIGH (ref 6–23)
CO2: 30 meq/L (ref 19–32)
Calcium: 9.6 mg/dL (ref 8.4–10.5)
Chloride: 102 meq/L (ref 96–112)
Creatinine, Ser: 1.47 mg/dL — ABNORMAL HIGH (ref 0.40–1.20)
GFR: 32.35 mL/min — ABNORMAL LOW (ref >60.00)
Glucose, Bld: 97 mg/dL (ref 70–99)
Potassium: 4.5 meq/L (ref 3.5–5.1)
Sodium: 139 meq/L (ref 135–145)

## 2024-04-20 ENCOUNTER — Telehealth: Payer: Self-pay

## 2024-04-20 NOTE — Telephone Encounter (Signed)
 Ok to close referral

## 2024-04-20 NOTE — Telephone Encounter (Signed)
 Referral closed

## 2024-04-21 ENCOUNTER — Ambulatory Visit: Admitting: Cardiology

## 2024-04-22 ENCOUNTER — Ambulatory Visit: Payer: Self-pay | Admitting: Family Medicine

## 2024-04-25 ENCOUNTER — Telehealth: Payer: Self-pay | Admitting: Internal Medicine

## 2024-04-25 NOTE — Telephone Encounter (Signed)
 Julie Duncan with [employer] says a chronic condition verification form was sent to the office 4 times since 9/24. It was last sent on last week. Julie Duncan would like an update. She mentions they are able to get a verbal verification as well if needed.   Phone#: [phone]

## 2024-04-26 NOTE — Telephone Encounter (Signed)
 Returned call to [facility] [employer] regarding verification of chronic conditions for the patient. Representative stated that four prior requests for completion of a verification form had been sent but that a verbal attestation would be acceptable.  Patient verification completed using Full Name, Date of Birth, Home Address, and Telephone Number prior to releasing any information.  Provided verbal attestation confirming the following chronic diagnoses per review of the patient's chart:  Paroxysmal Atrial Fibrillation (I48.0)  Congestive Heart Failure, unspecified (I50.0)  Also confirmed that patient has been referred to Electrophysiology (EP) to be considered for a Loop Recorder, as documented in the most recent office visit with Lonni Meager, NP.

## 2024-04-27 NOTE — Telephone Encounter (Signed)
 Called [employer] who confirmed they have our office correct fax number.

## 2024-04-28 ENCOUNTER — Ambulatory Visit: Admitting: Family Medicine

## 2024-04-28 ENCOUNTER — Telehealth: Payer: Self-pay | Admitting: Family Medicine

## 2024-04-28 VITALS — BP 124/58 | HR 83 | Temp 98.2°F | Resp 18 | Ht 63.0 in | Wt 168.6 lb

## 2024-04-28 DIAGNOSIS — I509 Heart failure, unspecified: Secondary | ICD-10-CM

## 2024-04-28 DIAGNOSIS — I10 Essential (primary) hypertension: Secondary | ICD-10-CM | POA: Diagnosis not present

## 2024-04-28 DIAGNOSIS — R7989 Other specified abnormal findings of blood chemistry: Secondary | ICD-10-CM | POA: Diagnosis not present

## 2024-04-28 DIAGNOSIS — R0602 Shortness of breath: Secondary | ICD-10-CM | POA: Diagnosis not present

## 2024-04-28 DIAGNOSIS — R195 Other fecal abnormalities: Secondary | ICD-10-CM

## 2024-04-28 LAB — BASIC METABOLIC PANEL WITH GFR
BUN: 31 mg/dL — ABNORMAL HIGH (ref 6–23)
CO2: 27 meq/L (ref 19–32)
Calcium: 9.5 mg/dL (ref 8.4–10.5)
Chloride: 102 meq/L (ref 96–112)
Creatinine, Ser: 1.84 mg/dL — ABNORMAL HIGH (ref 0.40–1.20)
GFR: 24.71 mL/min — ABNORMAL LOW (ref >60.00)
Glucose, Bld: 95 mg/dL (ref 70–99)
Potassium: 5.4 meq/L — ABNORMAL HIGH (ref 3.5–5.1)
Sodium: 139 meq/L (ref 135–145)

## 2024-04-28 LAB — CBC WITH DIFFERENTIAL/PLATELET
Basophils Absolute: 0.1 K/uL (ref 0.0–0.1)
Basophils Relative: 1 % (ref 0.0–3.0)
Eosinophils Absolute: 0.3 K/uL (ref 0.0–0.7)
Eosinophils Relative: 5.6 % — ABNORMAL HIGH (ref 0.0–5.0)
HCT: 36.8 % (ref 36.0–46.0)
Hemoglobin: 12.1 g/dL (ref 12.0–15.0)
Lymphocytes Relative: 32.2 % (ref 12.0–46.0)
Lymphs Abs: 1.7 K/uL (ref 0.7–4.0)
MCHC: 32.8 g/dL (ref 30.0–36.0)
MCV: 89.3 fl (ref 78.0–100.0)
Monocytes Absolute: 0.4 K/uL (ref 0.1–1.0)
Monocytes Relative: 7.9 % (ref 3.0–12.0)
Neutro Abs: 2.9 K/uL (ref 1.4–7.7)
Neutrophils Relative %: 53.3 % (ref 43.0–77.0)
Platelets: 301 K/uL (ref 150.0–400.0)
RBC: 4.12 Mil/uL (ref 3.87–5.11)
RDW: 15.1 % (ref 11.5–15.5)
WBC: 5.4 K/uL (ref 4.0–10.5)

## 2024-04-28 LAB — BRAIN NATRIURETIC PEPTIDE: Pro B Natriuretic peptide (BNP): 79 pg/mL (ref 0.0–100.0)

## 2024-04-28 MED ORDER — POTASSIUM CHLORIDE CRYS ER 20 MEQ PO TBCR: EXTENDED_RELEASE_TABLET | ORAL | Status: DC

## 2024-04-28 MED ORDER — POLYETHYLENE GLYCOL 3350 17 GM/SCOOP PO POWD: 17.0000 g | Freq: Every day | ORAL | Status: DC

## 2024-04-28 MED ORDER — SPIRONOLACTONE 25 MG PO TABS: ORAL_TABLET | ORAL | Status: DC

## 2024-04-28 MED ORDER — POLYETHYLENE GLYCOL 3350 17 GM/SCOOP PO POWD: 17.0000 g | Freq: Every day | ORAL | 0 refills | Status: DC

## 2024-04-28 NOTE — Progress Notes (Signed)
 She is up to date on PNA vaccine.    D/w pt about status currently.  She has SOBOE.  She can walk but gets SOB doing so, occ needing to stop to catch her breath.  No CP.  Not lightheaded.    She has h/o IFOB positive.  D/w pt about GI clinic f/u- her OV was cancelled.  D/w pt about rescheduling.  She isn't having BRBPR per patient report.  Not lightheaded on standing from bed or chair.  No BLE edema.    She has been sleepy persistently, ie can easily fall asleep.  Does not appear drowsy or sedated at the office visit.  Discussed cardiorenal considerations regarding her heart failure and creatinine.  Discussed rationale for establishing a dry weight and the rationale for current meds.  All meds reviewed at office visit.  She is reportedly taking a memory supplement of unrecalled name.  I asked her to let me know about that.  Meds, vitals, and allergies reviewed.   ROS: Per HPI unless specifically indicated in ROS section   Nad Ncat Neck supple, no LA RRR with occ ectopy.   Ctab Abdomen soft.  Not tender to palpation. Skin well-perfused. No BLE edema.   40 minutes were devoted to patient care in this encounter (this includes time spent reviewing the patient's file/history, interviewing and examining the patient, counseling/reviewing plan with patient).

## 2024-04-28 NOTE — Telephone Encounter (Signed)
 Resent miralax , pt stated pharmacy didn't have it on file

## 2024-04-28 NOTE — Telephone Encounter (Signed)
 I would stop that supplement.  Her renal function is worse.  I would stop spironolactone and potassium and I would cut losartan  back to 1/2 tab per day.  Med list updated.  Please let me know about her BP early next week.  Thanks.

## 2024-04-28 NOTE — Patient Instructions (Addendum)
 Go to the lab on the way out.   If you have mychart we'll likely use that to update you.     Take your AM meds today when you get home.  Let me know if you see any changes over the next few hours- if you are lightheaded, is you urinate more, or if your breathing is better.   Please call about seeing GI  [facility]enter[facility] [address] [place] [address] [place], [place] [address] [phone]  I would take miralax daily.   Let me know about any other supplements you are taking.   Take care.  Glad to see you.

## 2024-04-28 NOTE — Telephone Encounter (Signed)
 Copied from CRM #[id number]. Topic: General - Call Back - No Documentation >> Apr 28, 2024  2:16 PM Leah C wrote: Reason for CRM: Patient called back to let Dr. Cleatus know the over the counter supplement that she is taking brain performance support. She takes it twice a day, here and there.   Patient is not having any dizziness or other symptoms.

## 2024-04-28 NOTE — Telephone Encounter (Signed)
 Pt.notified

## 2024-04-30 ENCOUNTER — Ambulatory Visit: Payer: Self-pay | Admitting: Family Medicine

## 2024-04-30 NOTE — Assessment & Plan Note (Signed)
 See after visit summary.  I asked her and her family to call about scheduling follow-up with GI.

## 2024-04-30 NOTE — Assessment & Plan Note (Signed)
 Cardiorenal considerations discussed.  See following phone note. Based on follow-up labs, would stop spironolactone and potassium and I would cut losartan  back to 1/2 tab per day.  She has cardiology follow-up pending.  I will route a copy of those labs.  I thank all involved.  Detailed conversation with patient about the rationale for all of her current meds.

## 2024-05-01 NOTE — Telephone Encounter (Signed)
 Copied from CRM #[id number]. Topic: Clinical - Medication Question >> May 01, 2024  9:47 AM Frederich PARAS wrote: Reason for CRM: pt calling in she does not know what medication to stop taking, I went through the notes on file. Pt was unsure of what medication to stop. She needs to know asap so she can stop taking it. Pt callback# [phone]  Spoke to pt. She said she does not have spironolactone. But she will stop the potassium and will cut the losartan  in half.

## 2024-05-02 ENCOUNTER — Encounter: Payer: Self-pay | Admitting: Physician Assistant

## 2024-05-02 NOTE — Progress Notes (Addendum)
 Received signout from Dr. Cleatus regarding abnormal labs:  Based on these labs, I asked her to stop spironolactone and potassium and cut losartan  back to 1/2 tab per day.  I would appreciate it if you could get follow-up labs done at the visit at your clinic.  Thanks. Patient has appt scheduled 05/08/24. Will need attention to this on f/u.

## 2024-05-04 ENCOUNTER — Ambulatory Visit: Payer: Self-pay

## 2024-05-04 ENCOUNTER — Other Ambulatory Visit: Payer: Self-pay | Admitting: Family Medicine

## 2024-05-04 ENCOUNTER — Telehealth: Payer: Self-pay | Admitting: Family Medicine

## 2024-05-04 DIAGNOSIS — R195 Other fecal abnormalities: Secondary | ICD-10-CM

## 2024-05-04 MED ORDER — EMPAGLIFLOZIN 10 MG PO TABS: 10.0000 mg | ORAL_TABLET | Freq: Every day | ORAL | 5 refills | Status: DC

## 2024-05-04 NOTE — Telephone Encounter (Signed)
 If she has brand new itching, then she needs to get checked in clinic/UC.    I am sending rx for jardiance.    I put in another referral for GI. Please route a message to referrals about follow up on that.  Thanks.

## 2024-05-04 NOTE — Telephone Encounter (Signed)
 Copied from CRM #[id number]. Topic: Referral - Question >> May 04, 2024 10:30 AM Mercedes MATSU wrote: Reason for CRM: Patient called in stating that she was supposed to get a referral to see a G.I doctor, she was given one but it was too far for her. She said that she requested a new referral for [place], but it had been closed. Patient is requesting a new referral can be sent. She is also requesting a call back and can be reached at [phone].

## 2024-05-04 NOTE — Addendum Note (Signed)
 Addended by: [name] on: 05/04/2024 04:00 PM   Modules accepted: Orders

## 2024-05-04 NOTE — Telephone Encounter (Signed)
 FYI Only or Action Required?: Action required by provider: medication refill request, clinical question for provider, and update on patient condition.  Patient was last seen in primary care on 04/28/2024 by [name], MD.  Called Nurse Triage reporting Medication Reaction.  Triage Disposition: Call PCP Within 24 Hours  Patient/caregiver understands and will follow disposition?: Yes     Copied from CRM #[id number]. Topic: Clinical - Red Word Triage >> May 04, 2024  2:54 PM Maisie C wrote: Red Word that prompted transfer to Nurse Triage: allergic reaction to meds. Itchy all over her body. Doesn't know which med is causing it.      Reason for Disposition  Medicine patch causing local rash or itching    Not a patch, but she believes her medication is causing her itchiness  Answer Assessment - Initial Assessment Questions Patient states she has been experiencing diffuse itching and believes it may be due to one of her medications and would like to try to find out which one it could be. She would also like an update on her referral for GI. Please advise.     Patient would also like a refill for her empagliflozin (JARDIANCE) 10 MG TABS tablet [[id number]]. She states that the below pharmacy is now her preferred pharmacy.    [facility] [address] [place] - [address] [place] [address] Phone: [phone]  Fax: [phone] DEA #: -- DAW Reason: --      1. NAME of MEDICINE: What medicine(s) are you calling about?     Unsure which medication  2. QUESTION: What is your question? (e.g., double dose of medicine, side effect)     Patient would like to try and found  4. SYMPTOMS: Do you have any symptoms? If Yes, ask: What symptoms are you having?  How bad are the symptoms (e.g., mild, moderate, severe)     Diffuse itchiness, no rash  Protocols used: Medication Question Call-A-AH

## 2024-05-05 ENCOUNTER — Ambulatory Visit: Admitting: Physician Assistant

## 2024-05-05 NOTE — Telephone Encounter (Signed)
 The referral was previously sent to [facility] [place], patient declined the referral which is why it was closed.   Per notes in the referral...  Call left message for pt to call [phone].Pt want to have 2nd opinion. I will let pt know that new MD will be here in Dec. And possible unable to see her until Jan. Due to this being a 2nd opinion.   Another time they spoke with the patient she declined the referral stating that she was already established with [facility].   If she wanting to go to [place] now, she does not need a new referral, she can just contact them directly to schedule. When she calls she can request [place] location.  [facility] at [facility]  [address] [place], [place] [address] Phone: [phone]

## 2024-05-05 NOTE — Telephone Encounter (Signed)
 Referral has been placed.  Please notify pt.  Routed to referrals.  Thanks.

## 2024-05-05 NOTE — Telephone Encounter (Signed)
 Pt has been scheduled to see Dr. Watt this morning. Pt is aware that the Jardiance has been sent in and the GI referral has been placed.

## 2024-05-05 NOTE — Telephone Encounter (Signed)
 I have went ahead and sent the [facility] referral back to them - it is Internal within [facility] so they will receive it in their WQ and will contact her directly to schedule, or she may call them.

## 2024-05-05 NOTE — Telephone Encounter (Signed)
 noted

## 2024-05-07 NOTE — Progress Notes (Signed)
     Viola Placeres T. Lurline Caver, MD, CAQ Sports Medicine [facility] [address] [place], [address]  Phone: [phone]  FAX: [phone]  Julie Duncan - 83 y.o. female  MRN [id number]  Date of Birth: [birth year]  Date: 05/08/2024  PCP: [name], MD  Referral: [name], MD  No chief complaint on file.  Subjective:   Julie Duncan is a 83 y.o. very pleasant female patient with There is no height or weight on file to calculate BMI. who presents with the following:  Discussed the use of AI scribe software for clinical note transcription with the patient, who gave verbal consent to proceed.  Patient presents with itching. History of Present Illness     Review of Systems is noted in the HPI, as appropriate  Objective:   There were no vitals taken for this visit.  GEN: No acute distress; alert,appropriate. PULM: Breathing comfortably in no respiratory distress PSYCH: Normally interactive.   Laboratory and Imaging Data:  Assessment and Plan:   No diagnosis found. Assessment & Plan   Medication Management during today's office visit: No orders of the defined types were placed in this encounter.  There are no discontinued medications.  Orders placed today for conditions managed today: No orders of the defined types were placed in this encounter.   Disposition: No follow-ups on file.  [employer] One speech-to-text software was used for transcription in this dictation.  Possible transcriptional errors can occur using Animal nutritionist.   Signed,  Jacques DASEN. Brysun Eschmann, MD   Outpatient Encounter Medications as of 05/08/2024  Medication Sig   amLODipine  (NORVASC ) 10 MG tablet Take 10 mg by mouth daily.   apixaban  (ELIQUIS ) 5 MG TABS tablet Take 5 mg by mouth.   aspirin  EC 81 MG tablet Take 1 tablet (81 mg total) by mouth daily. Swallow whole.   atorvastatin (LIPITOR) 40 MG tablet Take 40 mg by mouth daily.    empagliflozin (JARDIANCE) 10 MG TABS tablet Take 1 tablet (10 mg total) by mouth daily.   furosemide  (LASIX ) 40 MG tablet Take 40 mg by mouth daily.   losartan  (COZAAR ) 100 MG tablet Take 50 mg by mouth daily.   magnesium oxide (MAG-OX) 400 MG tablet Take 400 mg by mouth.   polyethylene glycol powder (GLYCOLAX/MIRALAX) 17 GM/SCOOP powder Take 17 g by mouth daily. Dissolve 1 capful (17g) in 4-8 ounces of liquid and take by mouth daily.   potassium chloride SA (KLOR-CON M) 20 MEQ tablet Held as of 04/28/24   spironolactone (ALDACTONE) 25 MG tablet Held as of 04/28/24   No facility-administered encounter medications on file as of 05/08/2024.

## 2024-05-07 NOTE — Progress Notes (Deleted)
 Cardiology Office Note    Date:  05/07/2024  ID:  Julie Duncan, DOB 30-Jan-1941, MRN [id number] PCP:  [name], MD  Cardiologist:  [name], MD  Electrophysiologist:  [name], MD   Chief Complaint: ***  History of Present Illness: .    Julie Duncan is a 83 y.o. 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 She is typically followed in [place].  She previously underwent resection of atrial myxoma at [facility] in 09/2014. Following resection, she noted worsening of palpitations and fatigue initially managed with atenolol therapy, subsequently switched to diltiazem which caused headaches, and finally, switched to metoprolol . She was on warfarin for a period of time for PAF following myxoma resection. She established with [facility] November 2017. In early 2022, in the setting of worsening palpitations, she wore an event monitor which showed 3813 runs of SVT, Mobitz 1 AV block was also noted. She underwent ablation for AVNRT in February 2022.  Event monitor was placed following the procedure was notable for 135 episodes of AV block including Mobitz 1 and Mobitz 2. Episodes of paroxysmal atrial fibrillation could not be excluded and she was placed on Eliquis  however, this was subsequently discontinued due to cost and she preferred to take aspirin . In February 2023, she was seen for fatigue and dyspnea, found to be in 4:1 atrial flutter and was placed back on Eliquis . Echo at that time showed EF 60-65% with mild MR/AI, and aortic sclerosis. She was seen by electrophysiology recommendation for catheter ablation. Prior to the procedure however, she experienced  dyspnea on exertion concerning for anginal equivalent therefore cath pursued 03/2022 with 50% mLAD, otherwise normal cors/normal LVEDP. She subsequently underwent atrial flutter ablation 05/2012. Upon postprocedure follow-up in 06/2022, she was tachycardic with soft blood pressure. Limited echo 06/2023 showed EF 50-55% without pericardial effusion. On her own accord, she discontinued Eliquis  in 08/2022.  EP recommended ILR to assist in decision-making around oral anticoagulation however, patient declined. AV nodal blocking agents have been avoided in the setting of history of Mobitz 1 and Mobitz 2 heart block. She was seen in clinic 10/2023 with increase in palpitations with NSR and PVCs on EKG. She was not interested in additional monitoring at that time.   She was recently re-evaluated by Lonni Meager NP 03/31/2024 with fatigue and palpitations, found to be in atrial fib with controlled HR. She was also notably recently evaluated for anemia and positive hemoccult pending GI workup therefore anticoagulation was deferred. She was also felt to have acute HFpEF. Loop diuretic deferred due to recent hypokalemia of 2.8 and spironolactone  ordered. Echocardiogram was ordered. In the meantime she was admitted to [facility] 10/16-10/18/25 with worsening SOB felt to have a/c HFpEF requiring IV Lasix  along with potassium/magnesium repletion. She was also hypertensive and unclear what meds she was taking at home. Inpatient cardiology team discussed case with GI at that time who recommended outpatient GI workup and cleared her to start Eliquis . Echo showed EF >55%, G2DD, mild AI/MR, normal RV, elevated RAP. She has since seen primary care who also had difficulty assuring her medication list. I received a message from Dr. Cleatus in follow-up regarding hyperkalemia and AKI, as he asked her to stop spironolactone  and decrease losartan   based on labs.   Refer to EP for atrial arrhythmia mgmt Repeat labs Family involvement to help  with medication confusion? Cx echo here  Chronic HFpEF complicated by recent AKI/hyperkalemia with prior history of hypokalemia Essential HTN Paroxysmal atrial fibrillation, also hx of paroxysmal atrial flutter with concomitant hx of Mobitz 1/2 Nonobstructive CAD Mild AI/MR   Labwork independently reviewed: 04/2024 BNP wnl, CBC wnl Hgb stable at 12.1, K 5.4, CR 1.84 03/2024 Mg 2.3, alb 3.3, LFTs ok, TSH wnl  ROS: .    Please see the history of present illness. Otherwise, review of systems is positive for ***.  All other systems are reviewed and otherwise negative.  Studies Reviewed: SABRA    EKG:  EKG is ordered today, personally reviewed, demonstrating ***  CV Studies: Cardiac studies reviewed are outlined and summarized above. Otherwise please see EMR for full report.   Current Reported Medications:.    No outpatient medications have been marked as taking for the 05/08/24 encounter (Appointment) with Skylur Fuston N, PA-C.    Physical Exam:    VS:  There were no vitals taken for this visit.   Wt Readings from Last 3 Encounters:  04/28/24 168 lb 9.6 oz (76.5 kg)  04/14/24 168 lb 4 oz (76.3 kg)  03/31/24 177 lb (80.3 kg)    GEN: Well nourished, well developed in no acute distress NECK: No JVD; No carotid bruits CARDIAC: ***RRR, no murmurs, rubs, gallops RESPIRATORY:  Clear to auscultation without rales, wheezing or rhonchi  ABDOMEN: Soft, non-tender, non-distended EXTREMITIES:  No edema; No acute deformity   Asessement and Plan:.     ***     Disposition: F/u with ***  Signed, Mckinze Poirier N Kristyne Woodring, PA-C

## 2024-05-08 ENCOUNTER — Ambulatory Visit: Attending: Physician Assistant | Admitting: Physician Assistant

## 2024-05-08 ENCOUNTER — Encounter: Payer: Self-pay | Admitting: Family Medicine

## 2024-05-08 ENCOUNTER — Encounter: Payer: Self-pay | Admitting: Physician Assistant

## 2024-05-08 ENCOUNTER — Ambulatory Visit (INDEPENDENT_AMBULATORY_CARE_PROVIDER_SITE_OTHER): Admitting: Family Medicine

## 2024-05-08 ENCOUNTER — Ambulatory Visit: Admitting: Physician Assistant

## 2024-05-08 VITALS — BP 90/58 | HR 67 | Ht 66.0 in | Wt 168.4 lb

## 2024-05-08 VITALS — BP 110/70 | HR 82 | Temp 97.5°F | Ht 63.0 in | Wt 167.4 lb

## 2024-05-08 DIAGNOSIS — I10 Essential (primary) hypertension: Secondary | ICD-10-CM

## 2024-05-08 DIAGNOSIS — I2510 Atherosclerotic heart disease of native coronary artery without angina pectoris: Secondary | ICD-10-CM | POA: Diagnosis not present

## 2024-05-08 DIAGNOSIS — I080 Rheumatic disorders of both mitral and aortic valves: Secondary | ICD-10-CM

## 2024-05-08 DIAGNOSIS — I5032 Chronic diastolic (congestive) heart failure: Secondary | ICD-10-CM | POA: Diagnosis not present

## 2024-05-08 DIAGNOSIS — I4819 Other persistent atrial fibrillation: Secondary | ICD-10-CM

## 2024-05-08 DIAGNOSIS — I441 Atrioventricular block, second degree: Secondary | ICD-10-CM

## 2024-05-08 DIAGNOSIS — L299 Pruritus, unspecified: Secondary | ICD-10-CM

## 2024-05-08 DIAGNOSIS — N179 Acute kidney failure, unspecified: Secondary | ICD-10-CM

## 2024-05-08 DIAGNOSIS — E782 Mixed hyperlipidemia: Secondary | ICD-10-CM

## 2024-05-08 DIAGNOSIS — I5022 Chronic systolic (congestive) heart failure: Secondary | ICD-10-CM

## 2024-05-08 DIAGNOSIS — E875 Hyperkalemia: Secondary | ICD-10-CM

## 2024-05-08 DIAGNOSIS — Z8679 Personal history of other diseases of the circulatory system: Secondary | ICD-10-CM

## 2024-05-08 DIAGNOSIS — I4892 Unspecified atrial flutter: Secondary | ICD-10-CM

## 2024-05-08 DIAGNOSIS — I471 Supraventricular tachycardia, unspecified: Secondary | ICD-10-CM

## 2024-05-08 DIAGNOSIS — I480 Paroxysmal atrial fibrillation: Secondary | ICD-10-CM

## 2024-05-08 MED ORDER — APIXABAN 5 MG PO TABS: 5.0000 mg | ORAL_TABLET | Freq: Two times a day (BID) | ORAL | 6 refills | Status: DC

## 2024-05-08 NOTE — Progress Notes (Signed)
 In precharting patient, unclear why appt was scheduled in [place] as she exclusively receives care in [place]. Based on chart review and reports of difficulty with medication reconciliation, I was concerned that patient may not be even aware that appointment was scheduled in [place] (scheduled in October). Our CMA Janese Young called patient this AM who in fact was not aware that appt was in [place] and wants to f/u in [place] instead. We have secured the 2:15 spot on Mariah Carey's schedule. She was advised to bring all meds to that appointment. I reached out to Lesley Maffucci via secure chat to relay update/prechart as well as commentary about follow-up action items (needing BMET, med review, cancelling OP echo unless needed since she had this done Oct 2025 @ [facility]).  Patient was grateful that office reached out otherwise she would not have attended appt at all today.

## 2024-05-08 NOTE — Patient Instructions (Signed)
 Medication Instructions:  Your physician recommends the following medication changes.  STOP TAKING:  Aspirin  81 mg Amlodipine  10 mg   *If you need a refill on your cardiac medications before your next appointment, please call your pharmacy*  Lab Work: Your provider would like for you to have following labs drawn today BMET, CBC.  If you have labs (blood work) drawn today and your tests are completely normal, you will receive your results only by: MyChart Message (if you have MyChart) OR A paper copy in the mail If you have any lab test that is abnormal or we need to change your treatment, we will call you to review the results.  Testing/Procedures:     Dear Julie Duncan  You are scheduled for a Cardioversion on Tuesday, November 25 with Dr. ARGENTINA.  Please arrive at the [facility] Entrance of [facility], [address], [place] [address] at 11:30 AM (This is 1 hour(s) prior to your procedure time).  Proceed to the Check-In Desk directly inside the entrance.  Procedure Parking: Use the entrance off of the [address] side of the hospital. Turn right upon entering and follow the driveway to parking that is directly in front of the [facility]. There is no valet parking available at this entrance, however there is an awning directly in front of the [facility] for drop off/ pick up for patients.    DIET:  Nothing to eat or drink after midnight except a sip of water with medications (see medication instructions below)  MEDICATION INSTRUCTIONS: !!IF ANY NEW MEDICATIONS ARE STARTED AFTER TODAY, PLEASE NOTIFY YOUR PROVIDER AS SOON AS POSSIBLE!!  FYI: Medications such as Semaglutide (Ozempic, Wegovy), Tirzepatide (Mounjaro, Zepbound), Dulaglutide (Trulicity), etc (GLP1 agonists) AND Canagliflozin (Invokana), Dapagliflozin (Farxiga), Empagliflozin (Jardiance), Ertugliflozin (Steglatro), Bexagliflozin [employer]) or any combination with one of these  drugs such as Invokamet (Canagliflozin/Metformin), Synjardy (Empagliflozin/Metformin), etc (SGLT2 inhibitors) must be held around the time of a procedure. This is not a comprehensive list of all of these drugs. Please review all of your medications and talk to your provider if you take any one of these. If you are not sure, ask your provider.   MEDICATIONS: HOLD FUROSEMIDE  AND SPIRONOLACTONE THE MORNING OF JARDIANCE 3 DAYS PRIOR STARTING ON 05/13/24   LABS:  TODAY   FYI:  For your safety, and to allow us  to monitor your vital signs accurately during the surgery/procedure we request: If you have artificial nails, gel coating, SNS etc, please have those removed prior to your surgery/procedure. Not having the nail coverings /polish removed may result in cancellation or delay of your surgery/procedure.  Your support person will be asked to wait in the waiting room during your procedure.  It is OK to have someone drop you off and come back when you are ready to be discharged.  You cannot drive after the procedure and will need someone to drive you home.  Bring your insurance cards.  *Special Note: Every effort is made to have your procedure done on time. Occasionally there are emergencies that occur at the hospital that may cause delays. Please be patient if a delay does occur.      Follow-Up: At [facility], you and your health needs are our priority.  As part of our continuing mission to provide you with exceptional heart care, our providers are all part of one team.  This team includes your primary Cardiologist (physician) and Advanced Practice Providers or  APPs (Physician Assistants and Nurse Practitioners) who all work together to provide you with the care you need, when you need it.  Your next appointment:   1-2  week(s) WITH EP DR LAMBERT OR CHANTAL RIDDLE  NP  2-3 MONTH F/U WITH LESLEY MAFFUCCI, PA  Provider:   You will see one of the following Advanced Practice Providers on  your designated Care Team:   Lonni Meager, NP Lesley Maffucci, PA-C Bernardino Bring, PA-C Cadence [place], PA-C Tylene Lunch, NP Barnie Hila, NP      We recommend signing up for the patient portal called MyChart.  Sign up information is provided on this After Visit Summary.  MyChart is used to connect with patients for Virtual Visits (Telemedicine).  Patients are able to view lab/test results, encounter notes, upcoming appointments, etc.  Non-urgent messages can be sent to your provider as well.   To learn more about what you can do with MyChart, go to [email or web address].

## 2024-05-08 NOTE — Patient Instructions (Addendum)
 Stop furosemide  (lasix ) for now - itching?  Start Zyrtec (certrizine) - once a day (Allegra or Claritin also OK)

## 2024-05-08 NOTE — Progress Notes (Signed)
 Cardiology Office Note    Date:  05/08/2024   ID:  Julie Duncan, DOB 31-Jan-1941, MRN [id number]  PCP:  [name], MD  Cardiologist:  [name], MD  Electrophysiologist:  [name], MD   Chief Complaint: Hospital follow up  History of Present Illness:   Julie Duncan is a 83 y.o. female with history of with history of atrial myxoma s/p resection at [facility] in 09/2014, paroxysmal atrial fibrillation, PSVT/AVNRT status post catheter ablation in 07/2020, atrial flutter s/p ablation in 05/2022, PVCs, nonobstructive CAD by cath 03/2022, Mobitz 1 & 2 AV block, chronic HFpEF, hypertension, hyperlipidemia, arthritis, potassium abnormalities seen for hospital follow up.     She previously underwent resection of atrial myxoma at [facility] in 09/2014. Following resection, she noted worsening of palpitations and fatigue initially managed with atenolol therapy, subsequently switched to diltiazem which caused headaches, and finally, switched to metoprolol . She was on warfarin for a period of time for PAF following myxoma resection. [facility] with [facility] 2017. In early 2022, in the setting of worsening palpitations, she wore an event monitor which showed 3813 runs of SVT, Mobitz 1 AV block was also noted. She underwent ablation for AVNRT in February 2022.  Event monitor was placed following the procedure was notable for 135 episodes of AV block including Mobitz 1 and Mobitz 2. Episodes of paroxysmal atrial fibrillation could not be excluded and she was placed on Eliquis  however, this was subsequently discontinued due to cost and she preferred to take aspirin . In February 2023, she was seen for fatigue and dyspnea, found to be in 4:1 atrial flutter and was placed back on Eliquis . Echo at that time showed EF 60-65% with mild MR/AI, and aortic sclerosis. She was seen by electrophysiology recommendation for catheter ablation. Prior to the procedure however, she experienced dyspnea on  exertion concerning for anginal equivalent therefore cath pursued 03/2022 with 50% mLAD, otherwise normal cors/normal LVEDP. She subsequently underwent atrial flutter ablation 05/2012. Upon postprocedure follow-up in 06/2022, she was tachycardic with soft blood pressure. Limited echo 06/2023 showed EF 50-55% without pericardial effusion. On her own accord, she discontinued Eliquis  in 08/2022.  EP recommended ILR to assist in decision-making around oral anticoagulation however, patient declined. AV nodal blocking agents have been avoided in the setting of history of Mobitz 1 and Mobitz 2 heart block. She was seen in clinic 10/2023 with increase in palpitations with NSR and PVCs on EKG. She was not interested in additional monitoring at that time.     She was recently seen in our office 03/31/2024 with fatigue and palpitations, found to be in atrial fib with controlled HR. She was also notably recently evaluated for anemia and positive hemoccult pending GI workup therefore anticoagulation was deferred. She was also felt to have acute HFpEF. Loop diuretic deferred due to recent hypokalemia of 2.8 and spironolactone ordered. Echocardiogram was ordered.   She was admitted to [facility] 10/16-10/18/25 with worsening SOB felt to have a/c HFpEF requiring IV Lasix  along with potassium/magnesium repletion. She was also hypertensive and unclear what meds she was taking at home. Inpatient cardiology team discussed case with GI at that time who recommended outpatient GI workup and cleared her to start Eliquis . Echo showed EF >55%, G2DD, mild AI/MR, normal RV, elevated RAP. She has since seen primary care who also had difficulty assuring her medication list. Our office received a message from Dr. Cleatus in follow-up regarding hyperkalemia and AKI, as he asked her to stop  spironolactone and decrease losartan  based on labs.  Patient presents today for hospital follow up. She reports overall feeling fatigued. She continues to have  dyspnea on exertion, although dyspnea is improved since prior to her hospitalization. Dyspnea is worse when she is walking a long distance or in a hurry. She has some confusion regarding what specifically she was hospitalized for. We discussed her diagnoses and treatment received. She has been experiencing a full body rash for the past 3 weeks. She was evaluated by family medicine this morning for the rash and only took her losartan  prior to that appointment. She then took the rest of her medications after that appointment shortly before arriving to our clinic. She brings her medications to her appointment today and confirms that she has been diligent in taking them as prescribed. She uses a pill box at home. Her BP is noted to be low in office today. She endorses staying well hydrated. She denies lightheadedness and dizziness. She remains in rate controlled atrial fibrillation on EKG today. She is without symptoms of palpitations and chest pain. No bleeding or hematochezia.   Labs independently reviewed: 04/2024 BNP wnl, CBC wnl Hgb stable at 12.1, K 5.4, CR 1.84 03/2024 Mg 2.3, alb 3.3, LFTs ok, TSH wnl  Objective   Past Medical History:  Diagnosis Date   Arthritis    hands and ankles   Atrial flutter (HCC)    a. 05/2022 s/p catheter ablation.   Atrial myxoma    a. 2016 s/p resection ([facility]); b. 04/2017 Echo: EF 60-65%, no rwma, mild AI/MR. Mild-mod TR. Nl PASP; c. 07/2021 Echo: EF 60-65%, no rwma, nl RV fxn, mild MR/AI. Ao sclerosis; d. 06/2022 Echo: EF 50-55%, mild LVH, nl RV fxn, mild MR/MVP. Mild AI.   Coronary artery disease 03/27/2022   a. 03/2022 Cath: LM nl, LAD 85m (nl iFR - 1.0), LCX nl, RCA nl.   Excessive daytime sleepiness    History of stress test    a. 04/2017 ETT: Poor ex capacity. No ischemia. Episode of PSVT noted.   Hyperlipidemia    Hypertension    Interstitial lung disease (HCC)    Mild aortic regurgitation    Mild mitral regurgitation    Mobitz I    Mobitz II     Paroxysmal atrial fibrillation (HCC)    a. CHA2DS2VASc = 4.  Did not tolerate Warfarin. Difficulty in affording DOAC. Eliquis  added 07/2021 (new Aflutter).   Paroxysmal SVT (supraventricular tachycardia)    a. 06/2020 Zio: 3813 SVT runs - longest 29m 47s, max rate 231; b. 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   Wears dentures    partial upper and lower.  no longer has lower    Current Medications: Current Meds  Medication Sig   apixaban  (ELIQUIS ) 5 MG TABS tablet Take 5 mg by mouth.   apixaban  (ELIQUIS ) 5 MG TABS tablet Take 1 tablet (5 mg total) by mouth 2 (two) times daily.   atorvastatin (LIPITOR) 40 MG tablet Take 40 mg by mouth daily.   furosemide  (LASIX ) 40 MG tablet Take 40 mg by mouth daily.   losartan  (COZAAR ) 100 MG tablet Take 50 mg by mouth daily.   magnesium oxide (MAG-OX) 400 MG tablet Take 400 mg by mouth.   polyethylene glycol powder (GLYCOLAX/MIRALAX) 17 GM/SCOOP powder Take 17 g by mouth daily. Dissolve 1 capful (17g) in 4-8 ounces of liquid and take by mouth daily.   [DISCONTINUED] amLODipine  (NORVASC ) 10 MG tablet Take 10 mg by mouth daily.   [  DISCONTINUED] aspirin  EC 81 MG tablet Take 1 tablet (81 mg total) by mouth daily. Swallow whole.    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 Has a cat.   Daughter in [place]. Son  in [place]      Has living will   Daughter is health care POA   Would accept resuscitation   Not sure about tube feeds   Social Drivers of Health   Financial Resource Strain: Low Risk  (10/12/2023)   Overall Financial Resource Strain (CARDIA)    Difficulty of Paying Living Expenses: Not hard at all  Food Insecurity: No Food Insecurity (10/12/2023)   Hunger Vital Sign    Worried About Running Out of Food in the Last Year: Never true    Ran Out of Food in the Last Year: Never true  Transportation Needs: No Transportation Needs (10/12/2023)   PRAPARE - Administrator, Civil Service (Medical): No    Lack of Transportation (Non-Medical): No  Physical Activity: Insufficiently Active (10/12/2023)   Exercise Vital Sign    Days of Exercise per Week: 3 days    Minutes of Exercise per Session: 20 min  Stress: No Stress Concern Present (10/12/2023)   [employer] of Occupational Health - Occupational Stress Questionnaire    Feeling of Stress : Not at all  Social Connections: Socially Integrated (10/12/2023)   Social Connection and Isolation Panel    Frequency of Communication with Friends and Family: More than three times a week    Frequency of Social Gatherings with Friends and Family: More than three times a week    Attends Religious Services: More than 4 times per year    Active Member of [employer] or Organizations: Yes    Attends Engineer, Structural: More than 4 times per year    Marital Status: Married     Family History:  The patient's family history includes Cancer in her brother and father; Cancer (age of onset: 38) in her sister; Heart attack (age of onset: 1) in her brother; Heart attack (age of onset: 35) in her father; Stroke (age of onset: 77) in her mother.  ROS:   12-point review of systems is negative unless otherwise noted in the HPI.  EKGs/Other Studies Reviewed:    Studies reviewed were summarized above. The additional studies were reviewed  today:  04/06/2024 Echo complete [facility]) 1. Technically difficult study.    2. The left ventricle is normal in size with mildly increased wall thickness.   3. The left ventricular systolic function is normal, LVEF is visually estimated at > 55%.    4. There is grade II diastolic dysfunction (elevated filling pressure).    5. There is mild mitral valve regurgitation.  6. There is mild aortic regurgitation.    7. The right ventricle is normal in size, with normal systolic function.    8. IVC size and inspiratory change suggest elevated right atrial pressure.  (10-20 mmHg).   03/2022 LHC Moderate, nonobstructive single-vessel coronary artery disease with 50% mid LAD stenosis (iFR = 1.0).  No angiographically significant disease observed in the LCx or RCA. Normal left ventricular filling pressure (LVEDP 13 mmHg).  EKG:  EKG personally reviewed by me today EKG Interpretation Date/Time:  Monday May 08 2024 14:17:41 EST Ventricular Rate:  67 PR Interval:    QRS Duration:  94 QT Interval:  398 QTC Calculation: 420 R Axis:   -18  Text Interpretation: Atrial fibrillation When compared with ECG of 31-Mar-2024 10:48, Nonspecific T wave abnormality no longer evident in Lateral leads Confirmed by Lorene Sinclair ([id number]) on 05/08/2024 2:23:17 PM  PHYSICAL EXAM:    VS:  BP (!) 90/58 (BP Location: Left Arm, Patient Position: Sitting) Comment: JUST TOOK BP MEDS  Pulse 67   Ht 5' 6 (1.676 m)   Wt 168 lb 6.4 oz (76.4 kg)   SpO2 98%   BMI 27.18 kg/m   BMI: Body mass index is 27.18 kg/m.  GEN: Well nourished, well developed in no acute distress NECK: No JVD; No carotid bruits CARDIAC: IRIR, no murmurs, rubs, gallops RESPIRATORY:  Clear to auscultation without rales, wheezing or rhonchi  ABDOMEN: Soft, non-tender, non-distended EXTREMITIES: No edema; No deformity  Wt Readings from Last 3 Encounters:  05/08/24 168 lb 6.4 oz (76.4 kg)  05/08/24 167 lb 6 oz (75.9 kg)  04/28/24 168 lb 9.6  oz (76.5 kg)                  ASSESSMENT & PLAN:   Persistent atrial fibrillation Hx atrial flutter - Prior AVNRT and atrial flutter ablations. Recent hospitalization for acute on chronic HFpEF in the setting of atrial fibrillation. Echo showed preserved LV systolic function with G2DD. Previously not anticoagulated due to mild anemia. She was evaluated by GI during hospitalization and cleared to start anticoagulation with Eliquis  5 mg twice daily in the setting of CHA2DS2VASc 6. She has been taking this since hospital discharge on 04/07/2024 and confirmed she has not missed any doses. She remains in atrial fibrillation on EKG today which is rate controlled without medications. We will continue to avoid AV nodal blocking agents in the setting of Mobitz 1 and 2 in the past. Recommend proceeding with DCCV. I have stressed the importance of compliance with Eliquis  without any missed doses prior to procedure and patient expressed understanding. CBC and BMP today. We will plan for follow up with EP after cardioversion to discuss further management of afib.   HFpEF - Recent hospitalization at [facility] 10/16-10/18 for acute on chronic HFpEF during which she was aggressively diuresed. Echo showed EF > 55% with G2DD. Recent labs done with PCP on 11/7 revealed K 5.4, Cr 1.84, BUN 31, and BNP 79. Potassium supplementation and spironolactone were discontinued and losartan  was reduced to 50 mg daily. She appears euvolemic on exam today. Will update BMP as above. Would recommend reducing or stopping Lasix  if kidney function has not improved.   Primary hypertension - BP noted to be hypotensive today. She is asymptomatic to this. BP has been labile recently per chart review. Would recommend discontinuing amlodipine . She is continued on losartan  50 mg daily.   Hyperkalemia - Potassium was previously low and she required aggressive repletion during recent hospitalization.  Recent labs with PCP 11/7 revealed K 5.4 with  supplemental potassium and spironolactone discontinued and reduction in losartan . Update BMP as above.   AKI - Patient was diuresed during recent hospitalization with discharge Cr of 1.42 and BUN 17. Repeat labs with PCP 11/7 revealed Cr 1.84 and BUN 31 with losartan  subsequently reduced. Will update BMP today and plan to reduce or discontinue Lasix  if kidney function has not improved.   PSVT AVNRT Mobitz I/II heart block - S/p prior catheter ablation. Continue to avoid AV nodal blocking agents given history of heart block.   Nonobstructive CAD - Cath 03/2023 with moderate nonobstructive CAD. No symptoms of angina or cardiac decompensation. Recommend discontinuation of ASA in the setting of long term DOAC. She is continued on statin therapy.   Hyperlipidemia - Most recent lipid panel 03/2024 with LDL 152, goal < 70. She was recently started on atorvastatin 10 mg daily. Anticipate repeat lipid panel at follow up.     Informed Consent   Shared Decision Making/Informed Consent The risks (stroke, cardiac arrhythmias rarely resulting in the need for a temporary or permanent pacemaker, skin irritation or burns and complications associated with conscious sedation including aspiration, arrhythmia, respiratory failure and death), benefits (restoration of normal sinus rhythm) and alternatives of a direct current cardioversion were explained in detail to Ms. Steckman and she agrees to proceed.       Disposition: Discontinue amlodipine  and ASA. Check BMP and CBC today. Proceed with DCCV. F/u with EP in 1-2 weeks after procedure. F/u with Dr. Mady or an APP in 2-3 months.   Medication Adjustments/Labs and Tests Ordered: Current medicines are reviewed at length with the patient today.  Concerns regarding medicines are outlined above. Medication changes, Labs and Tests ordered today are summarized above and listed in the Patient Instructions accessible in Encounters.   Bonney Lesley Maffucci,  PA-C 05/08/2024 3:54 PM     [facility] [place] [address] [place], [place] [address] [phone]

## 2024-05-09 ENCOUNTER — Ambulatory Visit: Payer: Self-pay | Admitting: Physician Assistant

## 2024-05-09 DIAGNOSIS — Z79899 Other long term (current) drug therapy: Secondary | ICD-10-CM

## 2024-05-09 LAB — CBC
Hematocrit: 31.9 % — ABNORMAL LOW (ref 34.0–46.6)
Hemoglobin: 11.3 g/dL (ref 11.1–15.9)
MCH: 32.9 pg (ref 26.6–33.0)
MCHC: 35.4 g/dL (ref 31.5–35.7)
MCV: 93 fL (ref 79–97)
Platelets: 315 x10E3/uL (ref 150–450)
RBC: 3.43 x10E6/uL — ABNORMAL LOW (ref 3.77–5.28)
RDW: 13.4 % (ref 11.7–15.4)
WBC: 7.4 x10E3/uL (ref 3.4–10.8)

## 2024-05-09 LAB — BASIC METABOLIC PANEL WITH GFR
BUN/Creatinine Ratio: 14 (ref 12–28)
BUN: 23 mg/dL (ref 8–27)
CO2: 26 mmol/L (ref 20–29)
Calcium: 9.1 mg/dL (ref 8.7–10.3)
Chloride: 100 mmol/L (ref 96–106)
Creatinine, Ser: 1.65 mg/dL — ABNORMAL HIGH (ref 0.57–1.00)
Glucose: 93 mg/dL (ref 70–99)
Potassium: 4.2 mmol/L (ref 3.5–5.2)
Sodium: 141 mmol/L (ref 134–144)
eGFR: 30 mL/min/1.73 — ABNORMAL LOW (ref >59)

## 2024-05-10 ENCOUNTER — Telehealth: Payer: Self-pay | Admitting: Internal Medicine

## 2024-05-10 NOTE — Telephone Encounter (Signed)
 Called patient, patient states she has been having bouts of low blood pressure. More common when she goes from sitting to standing up after sitting for a little while. Asked patient if she stays hydrated throughout the day, patient states she has had a pepsi, tea and is now drinking water. Emphasized the importance of staying hydrated throughout the day. Went through patient medications, patient stating she was still on amlodipine , at her last visit with Lesley Maffucci PA-C she was instructed to stop taking amlodipine  d/t hypotension. Patient verbalized understanding to not take that medication anymore and that her blood pressure would improve. Also went over instructions for upcoming cardioversion, gave patient direction instructions and medication instructions. Went over what patient should hold and when. Patient wrote all this down and understood. No further questions at this time.

## 2024-05-10 NOTE — Telephone Encounter (Signed)
 Pt c/o BP issue: STAT if pt c/o blurred vision, one-sided weakness or slurred speech.  STAT if BP is GREATER than 180/120 TODAY.  STAT if BP is LESS than 90/60 and SYMPTOMATIC TODAY  1. What is your BP concern? BP too low  2. Have you taken any BP medication today? Yes  3. What are your last 5 BP readings?108/79  88/70  4. Are you having any other symptoms (ex. Dizziness, headache, blurred vision, passed out)? lightheaded

## 2024-05-15 ENCOUNTER — Other Ambulatory Visit

## 2024-05-16 ENCOUNTER — Telehealth: Payer: Self-pay | Admitting: Internal Medicine

## 2024-05-16 ENCOUNTER — Ambulatory Visit: Admission: RE | Admit: 2024-05-16 | Discharge: 2024-05-16 | Disposition: A | Discharge status: Home / Self Care

## 2024-05-16 ENCOUNTER — Encounter: Admission: RE | Disposition: A | Discharge status: Home / Self Care | Payer: Self-pay | Source: Home / Self Care

## 2024-05-16 ENCOUNTER — Ambulatory Visit: Admitting: General Practice

## 2024-05-16 ENCOUNTER — Other Ambulatory Visit: Payer: Self-pay

## 2024-05-16 DIAGNOSIS — I493 Ventricular premature depolarization: Secondary | ICD-10-CM | POA: Diagnosis not present

## 2024-05-16 DIAGNOSIS — I110 Hypertensive heart disease with heart failure: Secondary | ICD-10-CM | POA: Diagnosis not present

## 2024-05-16 DIAGNOSIS — E782 Mixed hyperlipidemia: Secondary | ICD-10-CM | POA: Diagnosis not present

## 2024-05-16 DIAGNOSIS — Z79899 Other long term (current) drug therapy: Secondary | ICD-10-CM | POA: Diagnosis not present

## 2024-05-16 DIAGNOSIS — Z7901 Long term (current) use of anticoagulants: Secondary | ICD-10-CM | POA: Diagnosis not present

## 2024-05-16 DIAGNOSIS — I4819 Other persistent atrial fibrillation: Secondary | ICD-10-CM | POA: Diagnosis present

## 2024-05-16 DIAGNOSIS — I4719 Other supraventricular tachycardia: Secondary | ICD-10-CM | POA: Diagnosis not present

## 2024-05-16 DIAGNOSIS — I4891 Unspecified atrial fibrillation: Secondary | ICD-10-CM

## 2024-05-16 DIAGNOSIS — I5032 Chronic diastolic (congestive) heart failure: Secondary | ICD-10-CM | POA: Diagnosis not present

## 2024-05-16 DIAGNOSIS — I2510 Atherosclerotic heart disease of native coronary artery without angina pectoris: Secondary | ICD-10-CM | POA: Diagnosis not present

## 2024-05-16 DIAGNOSIS — I4892 Unspecified atrial flutter: Secondary | ICD-10-CM | POA: Diagnosis not present

## 2024-05-16 DIAGNOSIS — N179 Acute kidney failure, unspecified: Secondary | ICD-10-CM | POA: Diagnosis not present

## 2024-05-16 DIAGNOSIS — I441 Atrioventricular block, second degree: Secondary | ICD-10-CM | POA: Diagnosis not present

## 2024-05-16 DIAGNOSIS — E875 Hyperkalemia: Secondary | ICD-10-CM | POA: Diagnosis not present

## 2024-05-16 SURGERY — CARDIOVERSION
Anesthesia: General

## 2024-05-16 MED ORDER — SODIUM CHLORIDE 0.9 % IV SOLN: INTRAVENOUS | Status: DC

## 2024-05-16 MED ORDER — PROPOFOL 1000 MG/100ML IV EMUL
INTRAVENOUS | Status: AC
  Filled 2024-05-16: qty 100

## 2024-05-16 MED ORDER — PROPOFOL 10 MG/ML IV BOLUS
INTRAVENOUS | Status: DC | PRN
  Administered 2024-05-16: 50 mg via INTRAVENOUS

## 2024-05-16 NOTE — Discharge Instructions (Signed)
 Implanted [facility] Guide  An implanted port is a type of central line that is placed under the skin. Central lines are used to provide IV access when treatment or nutrition needs to be given through a person's veins. Implanted ports are used for long-term IV access. An implanted port may be placed because: You need IV medicine that would be irritating to the small veins in your hands or arms. You need long-term IV medicines, such as antibiotics. You need IV nutrition for a long period. You need frequent blood draws for lab tests. You need dialysis.   Implanted ports are usually placed in the chest area, but they can also be placed in the upper arm, the abdomen, or the leg. An implanted port has two main parts: Reservoir. The reservoir is round and will appear as a small, raised area under your skin. The reservoir is the part where a needle is inserted to give medicines or draw blood. Catheter. The catheter is a thin, flexible tube that extends from the reservoir. The catheter is placed into a large vein. Medicine that is inserted into the reservoir goes into the catheter and then into the vein.   How will I care for my incision  You may shower tomorrow Please remove dressing in 24hrs not other skin care is needed  How is my port accessed? Special steps must be taken to access the port: Before the port is accessed, a numbing cream can be placed on the skin. This helps numb the skin over the port site. Your health care provider uses a sterile technique to access the port. Your health care provider must put on a mask and sterile gloves. The skin over your port is cleaned carefully with an antiseptic and allowed to dry. The port is gently pinched between sterile gloves, and a needle is inserted into the port. Only "non-coring" port needles should be used to access the port. Once the port is accessed, a blood return should be checked. This helps ensure that the port is in the vein and is not  clogged. If your port needs to remain accessed for a constant infusion, a clear (transparent) bandage will be placed over the needle site. The bandage and needle will need to be changed every week, or as directed by your health care provider.   What is flushing? Flushing helps keep the port from getting clogged. Follow your health care provider's instructions on how and when to flush the port. Ports are usually flushed with saline solution or a medicine called heparin. The need for flushing will depend on how the port is used. If the port is used for intermittent medicines or blood draws, the port will need to be flushed: After medicines have been given. After blood has been drawn. As part of routine maintenance. If a constant infusion is running, the port may not need to be flushed.   How long will my port stay implanted? The port can stay in for as long as your health care provider thinks it is needed. When it is time for the port to come out, surgery will be done to remove it. The procedure is similar to the one performed when the port was put in. When should I seek immediate medical care? When you have an implanted port, you should seek immediate medical care if: You notice a bad smell coming from the incision site. You have swelling, redness, or drainage at the incision site. You have more swelling or pain at the  port site or the surrounding area. You have a fever that is not controlled with medicine.   This information is not intended to replace advice given to you by your health care provider. Make sure you discuss any questions you have with your health care provider. Document Released: 06/08/2005 Document Revised: 11/14/2015 Document Reviewed: 02/13/2013 Elsevier Interactive Patient Education  2017 [employer].

## 2024-05-16 NOTE — Transfer of Care (Signed)
 Immediate Anesthesia Transfer of Care Note  Patient: Julie Duncan  Procedure(s) Performed: CARDIOVERSION  Patient Location: PACU and special recoveries  Anesthesia Type:General  Level of Consciousness: drowsy and patient cooperative  Airway & Oxygen Therapy: Patient Spontanous Breathing and Patient connected to nasal cannula oxygen  Post-op Assessment: Report given to RN and Post -op Vital signs reviewed and stable  Post vital signs: Reviewed and stable  Last Vitals:  Vitals Value Taken Time  BP 140/50 05/16/24 12:30  Temp    Pulse 53 05/16/24 12:32  Resp 14 05/16/24 12:32  SpO2 100 % 05/16/24 12:32    Last Pain:  Vitals:   05/16/24 1121  TempSrc: Oral  PainSc: 0-No pain         Complications: No notable events documented.

## 2024-05-16 NOTE — Anesthesia Postprocedure Evaluation (Signed)
 Anesthesia Post Note  Patient: Julie Duncan  Procedure(s) Performed: CARDIOVERSION  Patient location during evaluation: PACU Anesthesia Type: General Level of consciousness: awake and alert Pain management: pain level controlled Vital Signs Assessment: post-procedure vital signs reviewed and stable Respiratory status: spontaneous breathing, nonlabored ventilation, respiratory function stable and patient connected to nasal cannula oxygen Cardiovascular status: blood pressure returned to baseline and stable Postop Assessment: no apparent nausea or vomiting Anesthetic complications: no   No notable events documented.   Last Vitals:  Vitals:   05/16/24 1300 05/16/24 1315  BP: (!) 120/55 (!) 125/58  Pulse: (!) 59 (!) 57  Resp: 12 11  Temp:    SpO2: 98% 98%    Last Pain:  Vitals:   05/16/24 1315  TempSrc:   PainSc: 0-No pain                 [name]

## 2024-05-16 NOTE — Telephone Encounter (Signed)
 Patient stated she is not having the same issues as before and wants a call back to discuss if she needs to still have the procedure today.

## 2024-05-16 NOTE — Anesthesia Preprocedure Evaluation (Signed)
 Anesthesia Evaluation  Patient identified by MRN, date of birth, ID band Patient awake    Reviewed: Allergy & Precautions, H&P , NPO status , Patient's Chart, lab work & pertinent test results  Airway Mallampati: III  TM Distance: >3 FB     Dental  (+) Chipped, Partial Upper  Chipped, Partial Upper Has right side upper partial bridge to be removed. Chipped right upper central incisor:  :   Pulmonary neg pulmonary ROS          Cardiovascular hypertension, + CAD  negative cardio ROS + dysrhythmias Atrial Fibrillation   06-25-22 echo 1. Left ventricular ejection fraction, by estimation, is 50 to 55%. The  left ventricle has low normal function. Left ventricular endocardial  border not optimally defined to evaluate regional wall motion. There is  mild left ventricular hypertrophy.   2. Right ventricular systolic function is normal. The right ventricular  size is normal.   3. The mitral valve is degenerative. Mild mitral valve regurgitation.  There is mild prolapse of of the mitral valve.   4. Aortic valve regurgitation is mild.   5. The inferior vena cava is normal in size with greater than 50%  respiratory variability, suggesting right atrial pressure of 3 mmHg.     Neuro/Psych negative neurological ROS  negative psych ROS   GI/Hepatic negative GI ROS, Neg liver ROS,GERD  ,,  Endo/Other  negative endocrine ROS    Renal/GU      Musculoskeletal   Abdominal   Peds  Hematology negative hematology ROS (+)   Anesthesia Other Findings  Previous cataract surgery 09-30-23 Dr. Ola   Hyperlipidemia  Paroxysmal atrial fibrillation [facility]) Atrial myxoma Hypertension Paroxysmal  SVT (supraventricular tachycardia) (HCC)  History of stress test Mobitz I  Mobitz II Atrial flutter (HCC)  Coronary artery disease Wears dentures  Arthritis Interstitial lung disease (HCC)  Mild aortic regurgitation Excessive daytime  sleepiness      Reproductive/Obstetrics negative OB ROS                              Anesthesia Physical Anesthesia Plan  ASA: 3  Anesthesia Plan: General   Post-op Pain Management:    Induction: Intravenous  PONV Risk Score and Plan: 3 and Propofol  infusion  Airway Management Planned: Natural Airway and Nasal Cannula  Additional Equipment:   Intra-op Plan:   Post-operative Plan:   Informed Consent: I have reviewed the patients History and Physical, chart, labs and discussed the procedure including the risks, benefits and alternatives for the proposed anesthesia with the patient or authorized representative who has indicated his/her understanding and acceptance.     Dental Advisory Given  Plan Discussed with: Anesthesiologist, CRNA and Surgeon  Anesthesia Plan Comments: (Patient consented for risks of anesthesia including but not limited to:  - adverse reactions to medications - risk of airway placement if required - damage to eyes, teeth, lips or other oral mucosa - nerve damage due to positioning  - sore throat or hoarseness - Damage to heart, brain, nerves, lungs, other parts of body or loss of life  Patient voiced understanding and assent.)        Anesthesia Quick Evaluation

## 2024-05-16 NOTE — H&P (Signed)
 History and Physical Interval Note:  05/16/2024 10:51 AM  Julie Duncan  has presented today for procedure, with the diagnosis of persistent atrial fibrillation.  The various methods of treatment have been discussed with the patient and family. After consideration of risks, benefits and other options for treatment, the patient has consented to  Procedure(s): CARDIOVERSION (N/A) as a therapeutic intervention.  The patient's history has been reviewed, patient examined, no change in status, stable for procedure.  I have reviewed the patient's chart and labs.  Questions were answered to the patient's satisfaction.     Caron [facility]

## 2024-05-16 NOTE — CV Procedure (Addendum)
   DIRECT CURRENT CARDIOVERSION  NAME:  Julie Duncan    MRN: [id number] DOB:  September 10, 1940    ADMIT DATE: 05/16/2024  INDICATIONS: Symptomatic atrial fibrillation  PROCEDURE:   Informed consent was obtained prior to the procedure. The risks, benefits and alternatives for the procedure were discussed and the patient comprehended these risks.  Risks include, but are not limited to, cough, sore throat, vomiting, nausea, somnolence, bleeding, low blood pressure, aspiration, pneumonia, infection, and death.  After a procedural time-out, the patient was sedated by the anesthesia service. Once an appropriate level of sedation was confirmed, the patient was cardioverted x 1 with 200J of biphasic synchronized energy.  The patient converted to NSR with Mobitz I.  There were no apparent complications.  The patient had normal neuro status and respiratory status post procedure with vitals stable as recorded elsewhere.  Adequate airway was maintained throughout and vital signs monitored per protocol.  COMPLICATIONS:    Complications: No complications Patient tolerated procedure well.  Signed, Caron Poser, MD

## 2024-05-16 NOTE — Anesthesia Procedure Notes (Signed)
 Procedure Name: MAC Date/Time: 05/16/2024 12:27 PM  Performed by: Lacretia Camelia NOVAK, CRNAPre-anesthesia Checklist: Patient identified, Emergency Drugs available, Suction available and Patient being monitored Patient Re-evaluated:Patient Re-evaluated prior to induction Oxygen Delivery Method: Nasal cannula

## 2024-05-16 NOTE — Telephone Encounter (Signed)
 Pt called reporting that over the last few days she has noticed improvement and is no longer experiencing shortness of breath with walking. Pt is inquiring whether she needs to proceed with the cardioversion scheduled for today. Pt was advised to keep the scheduled appointment, as it is difficult to determine whether she is in AFib without an EKG. Pt verbalized understanding.

## 2024-05-21 ENCOUNTER — Ambulatory Visit: Payer: Self-pay | Admitting: Family Medicine

## 2024-05-21 NOTE — Progress Notes (Unsigned)
 Electrophysiology Office Follow up Visit Note:    Date:  05/23/2024   ID:  Julie Duncan, DOB 12/28/1940, MRN [id number]  PCP:  [name], MD  [facility] Cardiologist:  [name], MD  [facility] Electrophysiologist:  [name], MD    Interval History:     Julie Duncan is a 83 y.o. female who presents for a follow up visit.   I last saw the patient in April 2024.  She has a history of atrial flutter with prior catheter ablation December 2023.  She was on Eliquis  for stroke prophylaxis.  She was seen by Lesley Maffucci on May 08, 2024.  At that appointment came shortly after an office visit demonstrating atrial fibrillation with controlled heart rates in October 2025.  She also had a recent hospitalization at [facility] for shortness of breath secondary to chronic diastolic heart failure.  Recent GI bleed as well.  Today she tells me she was doing well.  No problems with her heart rhythm since the cardioversion.  No swelling or other signs of heart failure.  Her breathing status is stable.  No problems with her medications.      Past medical, surgical, social and family history were reviewed.  ROS:   Please see the history of present illness.    All other systems reviewed and are negative.  EKGs/Labs/Other Studies Reviewed:    The following studies were reviewed today:  May 16, 2024 ECG shows sinus rhythm with first-degree AV delay May 16, 2024 EKG precardioversion shows atrial fibrillation/atypical atrial flutter Nov 18, 2023 EKG shows sinus rhythm, first-degree AV delay, frequent monomorphic PVCs EKG Interpretation Date/Time:  Tuesday May 23 2024 10:35:24 EST Ventricular Rate:  81 PR Interval:    QRS Duration:  74 QT Interval:  342 QTC Calculation: 397 R Axis:   8  Text Interpretation: Sinus tachycardia with 2nd degree A-V block (Mobitz I) Confirmed by Holts Ole [phone]) on 05/23/2024 10:41:26 AM    Physical Exam:     VS:  BP 120/60   Pulse 98   Ht 5' 6 (1.676 m)   Wt 168 lb 9.6 oz (76.5 kg)   SpO2 95%   BMI 27.21 kg/m     Wt Readings from Last 3 Encounters:  05/23/24 168 lb 9.6 oz (76.5 kg)  05/16/24 171 lb 3.2 oz (77.7 kg)  05/08/24 168 lb 6.4 oz (76.4 kg)     GEN: no distress CARD: RRR, No MRG RESP: No IWOB. CTAB.      ASSESSMENT:    1. Chronic heart failure with preserved ejection fraction (HFpEF) (HCC)   2. Persistent atrial fibrillation (HCC)   3. PSVT (paroxysmal supraventricular tachycardia)    PLAN:    In order of problems listed above:  #Persistent atrial fibrillation #History of atrial flutter Prior AVNRT and atrial flutter ablation.  No prior atrial fibrillation ablation Atrial fibrillation associated with chronic diastolic heart failure and hospitalization for acute diastolic heart failure. On Eliquis  for stroke prophylaxis although history of GI bleeding has complicated treatment in the past In sinus rhythm today after recent cardioversion  I discussed treatment options for her during today's clinic appointment.  For now we will continue with conservative/watchful waiting approach.  #Chronic diastolic heart failure NYHA class II.  Warm and dry on exam today.  Recent hospitalization at [facility]. Continue losartan , Lasix , Jardiance .  Her spironolactone  and potassium were held.  I discussed my upcoming departure from [facility] during today's clinic appointment.  The patient will continue to follow-up with one of my EP partners moving forward.  Follow-up 6 months with EP APP Continue routine follow-up with general cardiology  Signed, Ole Holts, MD, [facility], [facility] 05/23/2024 10:50 AM    Electrophysiology [facility]

## 2024-05-23 ENCOUNTER — Other Ambulatory Visit: Payer: Self-pay | Admitting: Emergency Medicine

## 2024-05-23 ENCOUNTER — Ambulatory Visit: Attending: Cardiology | Admitting: Cardiology

## 2024-05-23 ENCOUNTER — Encounter: Payer: Self-pay | Admitting: Cardiology

## 2024-05-23 VITALS — BP 120/60 | HR 98 | Ht 66.0 in | Wt 168.6 lb

## 2024-05-23 DIAGNOSIS — Z79899 Other long term (current) drug therapy: Secondary | ICD-10-CM

## 2024-05-23 DIAGNOSIS — I5032 Chronic diastolic (congestive) heart failure: Secondary | ICD-10-CM | POA: Diagnosis not present

## 2024-05-23 DIAGNOSIS — I471 Supraventricular tachycardia, unspecified: Secondary | ICD-10-CM | POA: Diagnosis not present

## 2024-05-23 DIAGNOSIS — I4819 Other persistent atrial fibrillation: Secondary | ICD-10-CM | POA: Diagnosis not present

## 2024-05-23 NOTE — Patient Instructions (Signed)
 Medication Instructions:  Your physician recommends that you continue on your current medications as directed. Please refer to the Current Medication list given to you today.   *If you need a refill on your cardiac medications before your next appointment, please call your pharmacy*  Lab Work: None ordered at this time   Follow-Up: At [facility], you and your health needs are our priority.  As part of our continuing mission to provide you with exceptional heart care, our providers are all part of one team.  This team includes your primary Cardiologist (physician) and Advanced Practice Providers or APPs (Physician Assistants and Nurse Practitioners) who all work together to provide you with the care you need, when you need it.  Your next appointment:   6 month(s)  Provider:   You may see Sidra Kitty, MD or one of the following Advanced Practice Providers on your designated Care Team:   Charlies Arthur, PA-C Michael Andy Tillery, PA-C Suzann Riddle, NP Daphne Barrack, NP Artist Pouch, PA-C    We recommend signing up for the patient portal called MyChart.  Sign up information is provided on this After Visit Summary.  MyChart is used to connect with patients for Virtual Visits (Telemedicine).  Patients are able to view lab/test results, encounter notes, upcoming appointments, etc.  Non-urgent messages can be sent to your provider as well.   To learn more about what you can do with MyChart, go to [email or web address].

## 2024-05-24 LAB — BASIC METABOLIC PANEL WITH GFR
BUN/Creatinine Ratio: 13 (ref 12–28)
BUN: 14 mg/dL (ref 8–27)
CO2: 23 mmol/L (ref 20–29)
Calcium: 9.5 mg/dL (ref 8.7–10.3)
Chloride: 104 mmol/L (ref 96–106)
Creatinine, Ser: 1.1 mg/dL — ABNORMAL HIGH (ref 0.57–1.00)
Glucose: 81 mg/dL (ref 70–99)
Potassium: 4.3 mmol/L (ref 3.5–5.2)
Sodium: 141 mmol/L (ref 134–144)
eGFR: 49 mL/min/1.73 — ABNORMAL LOW (ref >59)

## 2024-05-25 ENCOUNTER — Ambulatory Visit: Payer: Self-pay | Admitting: Physician Assistant

## 2024-07-12 NOTE — Progress Notes (Unsigned)
 "  Cardiology Office Note    Date:  07/14/2024   ID:  Julie Duncan, DOB 1941/04/09, MRN [id number]  PCP:  [name], MD  Cardiologist:  [name], MD  Electrophysiologist:  [name], MD (Inactive)   Chief Complaint: Follow up  History of Present Illness:   Julie Duncan is a 84 y.o. female with history of atrial myxoma s/p resection at [facility] in 09/2014, paroxysmal atrial fibrillation, PSVT/AVNRT status post catheter ablation in 07/2020, atrial flutter s/p ablation in 05/2022, PVCs, nonobstructive CAD by cath 03/2022, Mobitz 1 & 2 AV block, chronic HFpEF, hypertension, hyperlipidemia, arthritis, potassium abnormalities who presents for follow up on atrial fibrillation.    She previously underwent resection of atrial myxoma at [facility] in 09/2014. Following resection, she noted worsening of palpitations and fatigue initially managed with atenolol therapy, subsequently switched to diltiazem which caused headaches, and finally, switched to metoprolol . She was on warfarin for a period of time for PAF following myxoma resection. [facility] 2017. In early 2022, in the setting of worsening palpitations, she wore an event monitor which showed 3813 runs of SVT, Mobitz 1 AV block was also noted. She underwent ablation for AVNRT in February 2022.  Event monitor was placed following the procedure was notable for 135 episodes of AV block including Mobitz 1 and Mobitz 2. Episodes of paroxysmal atrial fibrillation could not be excluded and she was placed on Eliquis  however, this was subsequently discontinued due to cost and she preferred to take aspirin . In February 2023, she was seen for fatigue and dyspnea, found to be in 4:1 atrial flutter and was placed back on Eliquis . Echo at that time showed EF 60-65% with mild MR/AI, and aortic sclerosis. She was seen by electrophysiology recommendation for catheter ablation. Prior to the procedure however, she experienced dyspnea on  exertion concerning for anginal equivalent therefore cath pursued 03/2022 with 50% mLAD, otherwise normal cors/normal LVEDP. She subsequently underwent atrial flutter ablation 05/2012. Upon postprocedure follow-up in 06/2022, she was tachycardic with soft blood pressure. Limited echo 06/2023 showed EF 50-55% without pericardial effusion. On her own accord, she discontinued Eliquis  in 08/2022.  EP recommended ILR to assist in decision-making around oral anticoagulation however, patient declined. AV nodal blocking agents have been avoided in the setting of history of Mobitz 1 and Mobitz 2 heart block. She was seen in clinic 10/2023 with increase in palpitations with NSR and PVCs on EKG. She was not interested in additional monitoring at that time.   She was recently seen 03/31/2024 with fatigue and palpitations, found to be in atrial fib with controlled HR. She was also notably recently evaluated for anemia and positive hemoccult pending GI workup therefore anticoagulation was deferred. She was also felt to have acute HFpEF. Loop diuretic deferred due to recent hypokalemia of 2.8 and spironolactone  ordered. Echocardiogram was ordered but not completed.    She was admitted to [facility] 10/16-10/18/25 with worsening SOB felt to have a/c HFpEF requiring IV Lasix  along with potassium/magnesium repletion. She was also hypertensive and unclear what meds she was taking at home. Inpatient cardiology team discussed case with GI at that time who recommended outpatient GI workup and cleared her to start Eliquis . Echo showed EF >55%, G2DD, mild AI/MR, normal RV, elevated RAP. She has since seen primary care who also had difficulty assuring her medication list. Our office received a message from Dr. Cleatus in follow-up regarding hyperkalemia and AKI, as he asked her to stop spironolactone   and decrease losartan  based on labs.    Patient was seen in clinic by myself 05/08/2024 for hospital follow-up.  She reported feeling fatigued with  dyspnea on exertion.  BP was noted to be low at 90/58.  Amlodipine  and aspirin  were discontinued.  She underwent successful DCCV 05/16/2024 with conversion to sinus rhythm with Mobitz 1.  She was seen in follow-up with EP 05/23/2024 maintaining sinus rhythm.  Conservative/watchful waiting approach was recommended.  Patient presents today with concern for elevated blood pressure readings as well as irregular heartbeat. She reports that she has been aware of her heartbeat recently. She is unable to tell me how long symptoms have been occurring. Denies lightheadedness, dizziness, and heart racing. She also reports elevated blood pressure readings recently, prompting her to self titrate her losartan  to twice daily. She reports taking all of her medications as prescribed. However, when reviewing her list, she believes she has only been taking losartan  after running out of her other medications. She is unable to tell me for how long she has been out of the others. She has been taking aspirin  81 mg daily instead of Eliquis . Denies chest pain, shortness of breath, and lower extremity swelling.   Labs independently reviewed: 05/2024-BUN 14, creatinine 1.10, sodium 141, potassium 4.3 04/2024-Hgb 11.3, HCT 31.9, platelets 315, BNP 79  Objective   Past Medical History:  Diagnosis Date   Arthritis    hands and ankles   Atrial flutter (HCC)    a. 05/2022 s/p catheter ablation.   Atrial myxoma    a. 2016 s/p resection ([facility]); b. 04/2017 Echo: EF 60-65%, no rwma, mild AI/MR. Mild-mod TR. Nl PASP; c. 07/2021 Echo: EF 60-65%, no rwma, nl RV fxn, mild MR/AI. Ao sclerosis; d. 06/2022 Echo: EF 50-55%, mild LVH, nl RV fxn, mild MR/MVP. Mild AI.   Coronary artery disease 03/27/2022   a. 03/2022 Cath: LM nl, LAD 28m (nl iFR - 1.0), LCX nl, RCA nl.   Excessive daytime sleepiness    History of stress test    a. 04/2017 ETT: Poor ex capacity. No ischemia. Episode of PSVT noted.   Hyperlipidemia    Hypertension     Interstitial lung disease (HCC)    Mild aortic regurgitation    Mild mitral regurgitation    Mobitz I    Mobitz II    Paroxysmal atrial fibrillation (HCC)    a. CHA2DS2VASc = 4.  Did not tolerate Warfarin. Difficulty in affording DOAC. Eliquis  added 07/2021 (new Aflutter).   Paroxysmal SVT (supraventricular tachycardia)    a. 06/2020 [facility]: 3813 SVT runs - longest 36m 47s, max rate 231; b. 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   Wears dentures    partial upper and lower.  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 Has a cat.   Daughter in [place]. Son in [place]      Has living will   Daughter is health care POA   Would accept resuscitation   Not sure about tube feeds   Social Drivers of Health   Tobacco Use: Low Risk (07/14/2024)   Patient History    Smoking Tobacco Use: Never    Smokeless Tobacco Use: Never    Passive Exposure: Past  Financial Resource Strain: Low Risk (10/12/2023)   Overall Financial Resource Strain (CARDIA)    Difficulty of Paying Living Expenses: Not hard at all  Food Insecurity: No Food Insecurity (10/12/2023)   Hunger Vital Sign    Worried About Running Out of Food in the Last Year: Never true    Ran Out of Food in the Last Year: Never  true  Transportation Needs: No Transportation Needs (10/12/2023)   PRAPARE - Administrator, Civil Service (Medical): No    Lack of Transportation (Non-Medical): No  Physical Activity: Insufficiently Active (10/12/2023)   Exercise Vital Sign    Days of Exercise per Week: 3 days    Minutes of Exercise per Session: 20 min  Stress: No Stress Concern Present (10/12/2023)   [employer] of Occupational Health - Occupational Stress Questionnaire    Feeling of Stress : Not at all  Social Connections: Socially Integrated (10/12/2023)   Social Connection and Isolation Panel    Frequency of Communication with Friends and Family: More than three times a week    Frequency of Social Gatherings with Friends and Family: More than three times a week    Attends Religious Services: More than 4 times per year    Active Member of Clubs or Organizations: Yes    Attends Banker Meetings: More than 4 times per year    Marital Status: Married  Depression (PHQ2-9): Low Risk (04/14/2024)   Depression (PHQ2-9)    PHQ-2 Score: 1  Recent Concern: Depression (PHQ2-9) - Medium Risk (03/16/2024)   Depression (PHQ2-9)    PHQ-2 Score: 9  Alcohol Screen: Low Risk (10/12/2023)   Alcohol Screen    Last Alcohol Screening Score (AUDIT): 0  Housing: Low Risk (10/12/2023)   Housing Stability Vital Sign    Unable to Pay for Housing in the Last Year: No    Number of Times Moved in the Last Year: 0    Homeless in the Last Year: No  Utilities: Not At Risk (10/12/2023)   AHC Utilities    Threatened with loss of utilities: No  Health Literacy: Not on file     Family History:  The patient's family history includes Cancer in her brother and father; Cancer (age of onset: 27) in her sister; Heart attack (age of onset: 32) in her brother; Heart attack (age of onset: 50) in her father; Stroke (age of onset: 57) in her mother.  ROS:   12-point review of systems is negative unless otherwise noted in the  HPI.  EKGs/Other Studies Reviewed:    Studies reviewed were summarized above. The additional studies were reviewed today:  04/06/2024 Echo complete [facility]) 1. Technically difficult study.    2. The left ventricle is normal in size with mildly increased wall thickness.   3. The left ventricular systolic function is normal, LVEF is visually estimated at > 55%.    4. There is grade II diastolic dysfunction (elevated  filling pressure).    5. There is mild mitral valve regurgitation.    6. There is mild aortic regurgitation.    7. The right ventricle is normal in size, with normal systolic function.    8. IVC size and inspiratory change suggest elevated right atrial pressure.  (10-20 mmHg).    03/2022 [facility] Moderate, nonobstructive single-vessel coronary artery disease with 50% mid LAD stenosis (iFR = 1.0).  No angiographically significant disease observed in the LCx or RCA. Normal left ventricular filling pressure (LVEDP 13 mmHg).  EKG:  EKG personally reviewed by me today EKG Interpretation Date/Time:  Friday July 14 2024 10:26:12 EST Ventricular Rate:  58 PR Interval:    QRS Duration:  78 QT Interval:  384 QTC Calculation: 376 R Axis:   -22  Text Interpretation: Atrial flutter with variable A-V block Nonspecific T wave abnormality When compared with ECG of 23-May-2024 10:35, Atrial flutter has replaced Sinus rhythm Confirmed by Lorene Sinclair ([id number]) on 07/14/2024 10:28:46 AM  PHYSICAL EXAM:    VS:  BP (!) 142/58   Pulse (!) 58   Ht 5' (1.524 m)   Wt 173 lb (78.5 kg)   SpO2 99%   BMI 33.79 kg/m   BMI: Body mass index is 33.79 kg/m.  GEN: Well nourished, well developed in no acute distress NECK: No JVD; No carotid bruits CARDIAC: IRIR, no murmurs, rubs, gallops RESPIRATORY:  Clear to auscultation without rales, wheezing or rhonchi  ABDOMEN: Soft, non-tender, non-distended EXTREMITIES: Trace bilateral LE edema; No deformity  Wt Readings from Last 3 Encounters:  07/14/24  173 lb (78.5 kg)  05/23/24 168 lb 9.6 oz (76.5 kg)  05/16/24 171 lb 3.2 oz (77.7 kg)                  ASSESSMENT & PLAN:   Persistent atrial fibrillation History of atrial flutter - Prior atrial flutter ablation 2023.  S/p recent DCCV 05/16/2024 with follow-up EKG 05/23/2024 showing normal sinus rhythm.  Patient reports awareness for irregular heartbeat recently.  EKG today shows that she is back in atrial fibrillation with rate well-controlled, not on AV nodal blocking agent.  She unfortunately has not been compliant with Eliquis  and is unable to tell me when the last dose she took was.  Recommend resuming Eliquis  5 mg twice daily.  Will refer back to EP for further management.  HFpEF - Recent echo at [facility] 03/2024 revealed EF greater than 55% with G2 DD.  She appears relatively euvolemic on exam today.  Has not been compliant with medications, unclear for how long.  Recommend resuming Jardiance  10 mg daily and Lasix  40 mg daily.  Primary hypertension - BP elevated today and per home readings.  Continue losartan  100 mg daily.  Start amlodipine  5 mg daily.  Continue to monitor at home.  PSVT AVNRT Mobitz 1/2 heart block - Prior catheter ablation.  Continue to avoid AV nodal blocking agents given history of heart block.  Nonobstructive CAD - Moderate nonobstructive CAD on cath 03/2023.  No symptoms of angina or cardiac decompensation.  Continue statin therapy and DOAC in place of aspirin .  Hyperlipidemia - Unfortunately has not been compliant with atorvastatin, unclear for how long.  Recommend resuming atorvastatin 40 mg daily with repeat lipid panel at follow-up.   Disposition: Start amlodipine  5 mg daily.  Refer to EP for A-fib management. F/u with Dr. Mady or an APP in 6 months.   Medication Adjustments/Labs and Tests Ordered: Current medicines are reviewed at length with  the patient today.  Concerns regarding medicines are outlined above. Medication changes, Labs and Tests  ordered today are summarized above and listed in the Patient Instructions accessible in Encounters.   Bonney Lesley Maffucci, PA-C 07/14/2024 11:29 AM     [facility] -  [address] [place], [place] [address] [phone]      [1]  Current Meds  Medication Sig   amLODipine  (NORVASC ) 5 MG tablet Take 1 tablet (5 mg total) by mouth daily.   magnesium oxide (MAG-OX) 400 MG tablet Take 400 mg by mouth.   Turmeric Curcumin 500 MG CAPS Take 500 mg by mouth every evening.   vitamin E 180 MG (400 UNITS) capsule Take 400 Units by mouth every evening.   [DISCONTINUED] apixaban  (ELIQUIS ) 5 MG TABS tablet Take 1 tablet (5 mg total) by mouth 2 (two) times daily.   [DISCONTINUED] atorvastatin (LIPITOR) 40 MG tablet Take 40 mg by mouth daily.   [DISCONTINUED] empagliflozin  (JARDIANCE ) 10 MG TABS tablet Take 1 tablet (10 mg total) by mouth daily.   [DISCONTINUED] furosemide  (LASIX ) 40 MG tablet Take 40 mg by mouth daily.   [DISCONTINUED] losartan  (COZAAR ) 100 MG tablet Take 50 mg by mouth daily.   "

## 2024-07-14 ENCOUNTER — Encounter: Payer: Self-pay | Admitting: Physician Assistant

## 2024-07-14 ENCOUNTER — Other Ambulatory Visit: Payer: Self-pay

## 2024-07-14 ENCOUNTER — Ambulatory Visit: Attending: Physician Assistant | Admitting: Physician Assistant

## 2024-07-14 VITALS — BP 142/58 | HR 58 | Ht 60.0 in | Wt 173.0 lb

## 2024-07-14 DIAGNOSIS — I10 Essential (primary) hypertension: Secondary | ICD-10-CM

## 2024-07-14 DIAGNOSIS — I5032 Chronic diastolic (congestive) heart failure: Secondary | ICD-10-CM | POA: Diagnosis not present

## 2024-07-14 DIAGNOSIS — I483 Typical atrial flutter: Secondary | ICD-10-CM | POA: Diagnosis not present

## 2024-07-14 DIAGNOSIS — I2510 Atherosclerotic heart disease of native coronary artery without angina pectoris: Secondary | ICD-10-CM

## 2024-07-14 DIAGNOSIS — E782 Mixed hyperlipidemia: Secondary | ICD-10-CM

## 2024-07-14 DIAGNOSIS — I471 Supraventricular tachycardia, unspecified: Secondary | ICD-10-CM

## 2024-07-14 DIAGNOSIS — I4819 Other persistent atrial fibrillation: Secondary | ICD-10-CM | POA: Diagnosis not present

## 2024-07-14 DIAGNOSIS — I441 Atrioventricular block, second degree: Secondary | ICD-10-CM | POA: Diagnosis not present

## 2024-07-14 MED ORDER — ATORVASTATIN CALCIUM 40 MG PO TABS: 40.0000 mg | ORAL_TABLET | Freq: Every day | ORAL | 3 refills | Status: DC

## 2024-07-14 MED ORDER — EMPAGLIFLOZIN 10 MG PO TABS: 10.0000 mg | ORAL_TABLET | Freq: Every day | ORAL | 5 refills | Status: AC

## 2024-07-14 MED ORDER — AMLODIPINE BESYLATE 5 MG PO TABS: 5.0000 mg | ORAL_TABLET | Freq: Every day | ORAL | 3 refills | Status: AC

## 2024-07-14 MED ORDER — FUROSEMIDE 40 MG PO TABS: 40.0000 mg | ORAL_TABLET | Freq: Every day | ORAL | 6 refills | Status: AC

## 2024-07-14 MED ORDER — LOSARTAN POTASSIUM 100 MG PO TABS: 50.0000 mg | ORAL_TABLET | Freq: Every day | ORAL | 3 refills | Status: AC

## 2024-07-14 MED ORDER — APIXABAN 5 MG PO TABS: 5.0000 mg | ORAL_TABLET | Freq: Two times a day (BID) | ORAL | 6 refills | Status: AC

## 2024-07-14 NOTE — Patient Instructions (Addendum)
 Medication Instructions:  Your physician recommends the following medication changes.  START TAKING: Amlodipine  5 MG Daily   Lab Work: No labs ordered today  If you have labs (blood work) drawn today and your tests are completely normal, you will receive your results only by: MyChart Message (if you have MyChart) OR A paper copy in the mail If you have any lab test that is abnormal or we need to change your treatment, we will call you to review the results.  Testing/Procedures: No test ordered today   Follow-Up: At [facility], you and your health needs are our priority.  As part of our continuing mission to provide you with exceptional heart care, our providers are all part of one team.  This team includes your primary Cardiologist (physician) and Advanced Practice Providers or APPs (Physician Assistants and Nurse Practitioners) who all work together to provide you with the care you need, when you need it.  Your next appointment:   6 month(s) Follow up with EP as Soon as possible  Okay to See Suzann Riddle , NP  Provider:   You may see [name], MD or one of the following Advanced Practice Providers on your designated Care Team:   Lonni Meager, NP Lesley Maffucci, PA-C Bernardino Bring, PA-C Cadence Croswell, PA-C Tylene Lunch, NP Barnie Hila, NP    We recommend signing up for the patient portal called MyChart.  Sign up information is provided on this After Visit Summary.  MyChart is used to connect with patients for Virtual Visits (Telemedicine).  Patients are able to view lab/test results, encounter notes, upcoming appointments, etc.  Non-urgent messages can be sent to your provider as well.   To learn more about what you can do with MyChart, go to [email or web address].   Other Instructions

## 2024-07-19 ENCOUNTER — Telehealth: Payer: Self-pay

## 2024-07-19 NOTE — Telephone Encounter (Signed)
 The patient called to reschedule her appointment due to weather conditions.

## 2024-07-21 ENCOUNTER — Ambulatory Visit: Admitting: Family Medicine

## 2024-07-24 ENCOUNTER — Ambulatory Visit: Admitting: Cardiology

## 2024-07-25 ENCOUNTER — Ambulatory Visit: Admitting: Family Medicine

## 2024-08-10 ENCOUNTER — Ambulatory Visit: Admitting: Cardiology

## 2024-08-16 ENCOUNTER — Ambulatory Visit: Admitting: Family Medicine

## 2024-10-12 ENCOUNTER — Ambulatory Visit

## 2024-10-13 ENCOUNTER — Ambulatory Visit

## 2024-12-21 ENCOUNTER — Other Ambulatory Visit

## 2024-12-29 ENCOUNTER — Encounter: Admitting: Family Medicine
